# Patient Record
Sex: Female | Born: 1985 | Race: White | Hispanic: No | Marital: Married | Smoking: Former smoker
Health system: Southern US, Community
[De-identification: ages and names within clinical notes are randomized; demographics above are authoritative.]

## PROBLEM LIST (undated history)

## (undated) DIAGNOSIS — G35 Multiple sclerosis: Secondary | ICD-10-CM

## (undated) DIAGNOSIS — H539 Unspecified visual disturbance: Secondary | ICD-10-CM

## (undated) DIAGNOSIS — R569 Unspecified convulsions: Secondary | ICD-10-CM

## (undated) DIAGNOSIS — R519 Headache, unspecified: Secondary | ICD-10-CM

## (undated) DIAGNOSIS — R51 Headache: Secondary | ICD-10-CM

## (undated) DIAGNOSIS — M549 Dorsalgia, unspecified: Secondary | ICD-10-CM

## (undated) DIAGNOSIS — F445 Conversion disorder with seizures or convulsions: Secondary | ICD-10-CM

## (undated) DIAGNOSIS — G610 Guillain-Barre syndrome: Secondary | ICD-10-CM

## (undated) HISTORY — PX: LUMBAR LAMINECTOMY: SHX95

## (undated) HISTORY — DX: Headache, unspecified: R51.9

## (undated) HISTORY — PX: KNEE SURGERY: SHX244

## (undated) HISTORY — DX: Unspecified visual disturbance: H53.9

## (undated) HISTORY — PX: BACK SURGERY: SHX140

## (undated) HISTORY — DX: Headache: R51

## (undated) HISTORY — PX: VAGINAL HYSTERECTOMY: SUR661

---
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
## 2013-11-28 DIAGNOSIS — S83006A Unspecified dislocation of unspecified patella, initial encounter: Secondary | ICD-10-CM | POA: Insufficient documentation

## 2014-06-20 ENCOUNTER — Encounter: Payer: Self-pay | Admitting: Neurology

## 2014-06-20 ENCOUNTER — Ambulatory Visit (INDEPENDENT_AMBULATORY_CARE_PROVIDER_SITE_OTHER): Payer: 59 | Admitting: Neurology

## 2014-06-20 VITALS — BP 134/92 | HR 68 | Resp 12 | Ht 67.5 in | Wt 231.2 lb

## 2014-06-20 DIAGNOSIS — R200 Anesthesia of skin: Secondary | ICD-10-CM | POA: Insufficient documentation

## 2014-06-20 DIAGNOSIS — G35 Multiple sclerosis: Secondary | ICD-10-CM

## 2014-06-20 DIAGNOSIS — G0489 Other myelitis: Secondary | ICD-10-CM

## 2014-06-20 DIAGNOSIS — F4323 Adjustment disorder with mixed anxiety and depressed mood: Secondary | ICD-10-CM | POA: Insufficient documentation

## 2014-06-20 DIAGNOSIS — R350 Frequency of micturition: Secondary | ICD-10-CM | POA: Insufficient documentation

## 2014-06-20 DIAGNOSIS — G373 Acute transverse myelitis in demyelinating disease of central nervous system: Secondary | ICD-10-CM | POA: Insufficient documentation

## 2014-06-20 DIAGNOSIS — H469 Unspecified optic neuritis: Secondary | ICD-10-CM

## 2014-06-20 MED ORDER — ESCITALOPRAM OXALATE 10 MG PO TABS: 10.0000 mg | ORAL_TABLET | Freq: Every day | ORAL | Status: DC

## 2014-06-20 NOTE — Progress Notes (Signed)
[facility] PATIENT: Caitlin [name] DOB: 07-22-1985  REFERRING CLINICIAN: Fredderick Severance  HISTORY FROM: patient REASON FOR VISIT: MS   HISTORICAL  CHIEF COMPLAINT:  Chief Complaint  Patient presents with  . optic neuropathy    Sts. in 2013 she had numbness/tingling, cold sensation in left foot/leg.  Sx. progressed upwards to waist over  he next couple of days.  She saw her pcp Luiz Iron) and was sent to Dr. Ala Dach at [facility], who dx. her with GBS and treated her with IVIG.  She sts. after that her sx. slowly resolved over the next couple of mos.  She didn't have any other sx. until about 05-23-14, when she noticed decreased vision in her right eye.  She was seen by Dr. Sondra Barges at [facility]--he ordered an mri brain which   . Numbness    showed mult. lesions suspicious for MS.  She was then treated at [facility] --had 2 doses of IV SoluMedrol there, then was d/c home and had 3 SM infusions by home health.  She was told to f/u with neurology at [facility], but had difficulty getting an appt., and so asked to see RAS.  Currently, she still c/o foggy vision right eye, worse when she is outside or on a computer.  She also c/o left sided numbness, tingling./fim  . Extremity Weakness    HISTORY OF PRESENT ILLNESS:  Caitlin [name] is a 29 year old woman who had the onset of left leg numbness one day in March or April 2013, followed the next day by right leg numbness. Over the next couple days the numbness went up to her waist and she also noted decreased sensation well urinating. She was weak in both legs. She was able to walk without support for some steps but could not walk far.   She saw Dr. Ala Dach. She had several tests done MRIs of thespine were performed. MRIs of the spine were reportedly normal but I do not have the actual images for review.   The MRI of the brain showed a single small focus in the left posterior frontal lobe. There is subtle enhancement of the focus by my review but  this was not reported in the study. She also had a nerve conduction study/EMG which was normal. She had a lumbar puncture and the CSF was reportedly normal. Because of the severity of her symptoms she did undergo 5 days of IVIG treatment for suspected Guillain Barr syndrome. Within 2 months she felt she was practically back to baseline.  Imaging January, she had the onset of eye pain with movements. At that time she started to also have blurry vision which progressed over the next day. She was [facility] to [facility] about 8 days after the onset and received 2 days of IV steroids in the hospital and 3 days of IV steroids at her home.  At its worst, she could only see gray out of the eye. She is better now but she still has graying and very poor vision peripherally and reduced vision centrally. She feels it may be slightly worse now than a couple of days ago, but not as bad as it was at the onset. Vital week after she started the steroids, she also had numbness in the left side of her face and some left-sided tingling in the arm. She also noticed some leg weakness on that side. She had an MRI performed 05/31/2014. I reviewed those images and compare them with the 2013 MRI. The new MRI  shows 3 foci on the left, all in the left frontal lobe, one anterior and to posterior. There is also one right juxtacortical focus. There is a small right cerebellar hemispheric lesion too.   None of these enhanced after gadolinium. The numbness and weakness of better but she feels weaker as the day goes on and is quite a bit weaker at night than she is in the morning. She feels her gait has returned mostly to normal but a Dr. commented to her yesterday that she had a mild abnormality with the gait.  She notes extreme fatigue. She is also more sleepy. Both of these issues worsened in the afternoon. For a couple days, right after she went back to work after the steroids, she was having difficulty coming up with the right words.  Otherwise, she does not note any significant cognitive dysfunction.  She notes urinary frequency and urgency. She has not had any incontinence. She denies any hesitancy. She has not had any urinary tract infections. Denies any difficulty with swallowing or with her bowels.    Over the last month, she has noted that she is more irritable and more anxious.  REVIEW OF SYSTEMS:  Constitutional: No fevers, chills, sweats, or change in appetite Eyes: see above Ear, nose and throat: No hearing loss, ear pain, nasal congestion, sore throat Cardiovascular: No chest pain, palpitations Respiratory:  No shortness of breath at rest or with exertion.   No wheezes GastrointestinaI: No nausea, vomiting, diarrhea, abdominal pain, fecal incontinence Genitourinary:  She reports urgency and frequency.  + nocturia. Musculoskeletal:  No neck pain, back pain Integumentary: No rash, pruritus, skin lesions Neurological: as above Psychiatric: No depression at this time.  No anxiety Endocrine: No palpitations, diaphoresis, change in appetite, change in weigh or increased thirst Hematologic/Lymphatic:  No anemia, purpura, petechiae. Allergic/Immunologic: No itchy/runny eyes, nasal congestion, recent allergic reactions, rashes  ALLERGIES: Allergies  Allergen Reactions  . Ciprofloxacin Anaphylaxis    HOME MEDICATIONS: No outpatient prescriptions prior to visit.   No facility-administered medications prior to visit.    PAST MEDICAL HISTORY: Past Medical History  Diagnosis Date  . Headache   . Vision abnormalities     PAST SURGICAL HISTORY: Past Surgical History  Procedure Laterality Date  . Cholecystectomy, laparoscopic  2004  . Cesarean section  2012  . Vaginal hysterectomy      FAMILY HISTORY: Family History  Problem Relation Age of Onset  . Diabetes type II Mother   . Hypertension Mother   . Fibromyalgia Mother   . Cardiomyopathy Father   . Heart attack Father     SOCIAL  HISTORY:  History   Social History  . Marital Status: Married    Spouse Name: N/A  . Number of Children: N/A  . Years of Education: N/A   Occupational History  . Not on file.   Social History Main Topics  . Smoking status: Current Every Day Smoker -- 1.00 packs/day    Types: Cigarettes  . Smokeless tobacco: Not on file  . Alcohol Use: No  . Drug Use: No  . Sexual Activity: Not on file   Other Topics Concern  . Not on file   Social History Narrative  . No narrative on file     PHYSICAL EXAM  Filed Vitals:   06/20/14 1009  BP: 134/92  Pulse: 68  Resp: 12  Height: 5' 7.5" (1.715 m)  Weight: 231 lb 3.2 oz (104.872 kg)    Body mass index is 35.66 kg/(m^2).  General: The patient is well-developed and well-nourished and in no acute distress  Eyes:  Funduscopic exam shows normal optic discs and retinal vessels.  Neck: The neck is supple, no carotid bruits are noted.  The neck is nontender.  Respiratory: The respiratory examination is clear.  Cardiovascular: The cardiovascular examination reveals a regular rate and rhythm, no murmurs, gallops or rubs are noted.  Skin: Extremities are without significant edema.  Neurologic Exam  Mental status: The patient is alert and oriented x 3 at the time of the examination. The patient has apparent normal recent and remote memory, with an apparently normal attention span and concentration ability.   Speech is normal.  Cranial nerves: Extraocular movements are full. Pupils show 1+ right APD.    Color vision and acuity are reduced OD.  Visual fields are full.  Facial symmetry is present. There is reduced left facial sensation to soft touch and temperature.Facial strength is normal.  Trapezius and sternocleidomastoid strength is normal. No dysarthria is noted.  The tongue is midline, and the patient has symmetric elevation of the soft palate. No obvious hearing deficits are noted.  Motor:  Muscle bulk and tone are normal.  Strength is  5 / 5 in all 4 extremities.   Sensory: Sensory testing is intact to pinprick, soft touch, vibration sensation, and position sense in the arms but she reports decreased vibration sensation in the left leg.  .  Coordination: Cerebellar testing reveals good finger-nose-finger and heel-to-shin bilaterally.  Gait and station: Station and gait are normal. Tandem gait is wide looking up, better looking at feet.. Romberg is positive.   Reflexes: Deep tendon reflexes are symmetric and normal bilaterally. Plantar responses are normal.    DIAGNOSTIC DATA (LABS, IMAGING, TESTING) - I reviewed patient records, labs, notes, testing and imaging myself where available.     ASSESSMENT AND PLAN  Multiple sclerosis - Plan: Stratify JCV Antibody Test ([facility]), Pan-ANCA, Vitamin D, 25-hydroxy, CBC with Differential, CMP, MR Cervical Spine W Wo Contrast, MR Thoracic Spine W Wo Contrast  Optic neuritis - Plan: Stratify JCV Antibody Test ([facility]), Pan-ANCA, Vitamin D, 25-hydroxy, CBC with Differential, CMP, MR Cervical Spine W Wo Contrast, MR Thoracic Spine W Wo Contrast  Numbness  Transverse myelitis  Adjustment disorder with mixed anxiety and depressed mood  Urinary frequency     In summary, Caitlin [name] is a 29 year old woman had an episode in 2013 was diagnosed as Guillain-Barr syndrome but could also have been due to a transverse myelitis that did not appear on MRI images. Of note, an MRI brain at that time showed only one lesion but I believe that it showed enhancement after gadolinium Then, last month, she had the onset of right optic neuritis and her MRI of the brain showed progression with 3 or 4 to lesions, not present on the earlier MRI.  She also has had mild difficulty with gait and numbness. The combination of her symptoms and the changes on the MRI are very consistent with multiple sclerosis and we discussed that we can be fairly certain of the diagnosis. We reviewed the  different treatment options. She is concerned about both safety and efficacy and is concerned about what her future prognosis might be we went over that she has both good and bad predictors of future disability. Being a younger white female often is associated with a better prognosis. However, I believe that she has had a transverse myelitis in the past and she has evidence of one small cerebellar lesion.  Infratentorial involvement can be associated with possibility of a more aggressive course. She would prefer not to do a self injectable medication, though is not completely opposed to doing so if she needed to. We will check her JCV stratify antibody. If she is JCV antibody negative, she would prefer to go on Tysabri because of his excellent efficacy and good tolerability. However, if she is JCV-positive she would prefer to go on one of the oral scope. We discussed the options and she would prefer Gilenya I will check some blood work and we will get an EKG for baseline if she is on Gilenya and set up a first observation.-wise, we will try to get her first infusion of Tysabri quickly if she is JCV antibody negative. We discussed some of her symptoms. She has depression with anxiety, likely worsened by her current uncertainty about her future. I started Lexapro 10 mg by mouth daily and we will increase or change as needed. She also has urinary frequency but would prefer not to treat this at this time as it is more of a nuisance than a problem.  She will return to see me in 4 weeks or sooner based on the results of the studies and is advised to call us sooner if she has new or worsening neurologic symptoms.   Hendrixx Severin A. Epimenio Foot, MD, PhD 06/20/2014, 10:50 AM Certified in Neurology, Clinical Neurophysiology, Sleep Medicine, Pain Medicine and Neuroimaging  [facility] [address], [address] [place], [place] [address] [phone]

## 2014-06-21 LAB — COMPREHENSIVE METABOLIC PANEL
ALT: 23 IU/L (ref 0–32)
AST: 15 IU/L (ref 0–40)
Albumin/Globulin Ratio: 1.8 (ref 1.1–2.5)
Albumin: 4.2 g/dL (ref 3.5–5.5)
Alkaline Phosphatase: 60 IU/L (ref 39–117)
BUN/Creatinine Ratio: 22 — ABNORMAL HIGH (ref 8–20)
BUN: 11 mg/dL (ref 6–20)
Bilirubin Total: 0.3 mg/dL (ref 0.0–1.2)
CALCIUM: 9.6 mg/dL (ref 8.7–10.2)
CHLORIDE: 103 mmol/L (ref 97–108)
CO2: 23 mmol/L (ref 18–29)
Creatinine, Ser: 0.51 mg/dL — ABNORMAL LOW (ref 0.57–1.00)
GFR calc Af Amer: 149 mL/min/{1.73_m2} (ref >59)
GFR calc non Af Amer: 129 mL/min/{1.73_m2} (ref >59)
Globulin, Total: 2.4 g/dL (ref 1.5–4.5)
Glucose: 82 mg/dL (ref 65–99)
POTASSIUM: 4.2 mmol/L (ref 3.5–5.2)
Sodium: 140 mmol/L (ref 134–144)
TOTAL PROTEIN: 6.6 g/dL (ref 6.0–8.5)

## 2014-06-21 LAB — CBC WITH DIFFERENTIAL/PLATELET
BASOS ABS: 0 10*3/uL (ref 0.0–0.2)
BASOS: 0 %
EOS: 1 %
Eosinophils Absolute: 0.1 10*3/uL (ref 0.0–0.4)
HEMATOCRIT: 43.7 % (ref 34.0–46.6)
Hemoglobin: 14.9 g/dL (ref 11.1–15.9)
Immature Grans (Abs): 0 10*3/uL (ref 0.0–0.1)
Immature Granulocytes: 0 %
LYMPHS: 27 %
Lymphocytes Absolute: 2.7 10*3/uL (ref 0.7–3.1)
MCH: 31.2 pg (ref 26.6–33.0)
MCHC: 34.1 g/dL (ref 31.5–35.7)
MCV: 92 fL (ref 79–97)
MONOCYTES: 7 %
Monocytes Absolute: 0.7 10*3/uL (ref 0.1–0.9)
NEUTROS ABS: 6.6 10*3/uL (ref 1.4–7.0)
Neutrophils Relative %: 65 %
Platelets: 241 10*3/uL (ref 150–379)
RBC: 4.77 x10E6/uL (ref 3.77–5.28)
RDW: 13.7 % (ref 12.3–15.4)
WBC: 10.2 10*3/uL (ref 3.4–10.8)

## 2014-06-21 LAB — PAN-ANCA
ANCA Proteinase 3: <3.5 U/mL (ref 0.0–3.5)
C-ANCA: <1:20 {titer}

## 2014-06-21 LAB — VITAMIN D 25 HYDROXY (VIT D DEFICIENCY, FRACTURES): Vit D, 25-Hydroxy: 19.7 ng/mL — ABNORMAL LOW (ref 30.0–100.0)

## 2014-06-29 ENCOUNTER — Encounter: Payer: Self-pay | Admitting: *Deleted

## 2014-06-29 ENCOUNTER — Telehealth: Payer: Self-pay | Admitting: Neurology

## 2014-06-29 MED ORDER — VITAMIN D (ERGOCALCIFEROL) 1.25 MG (50000 UNIT) PO CAPS: 50000.0000 [IU] | ORAL_CAPSULE | ORAL | Status: DC

## 2014-06-29 NOTE — Telephone Encounter (Signed)
Discussed JCV Ab test negative (0.15)    She would like to go on Tysabri  Vit D is low and we will send in script

## 2014-07-04 ENCOUNTER — Other Ambulatory Visit: Payer: 59

## 2014-07-06 ENCOUNTER — Telehealth: Payer: Self-pay | Admitting: *Deleted

## 2014-07-06 DIAGNOSIS — Z0289 Encounter for other administrative examinations: Secondary | ICD-10-CM

## 2014-07-06 NOTE — Telephone Encounter (Signed)
Patient form on Faith desk. 

## 2014-07-10 ENCOUNTER — Encounter: Payer: Self-pay | Admitting: *Deleted

## 2014-07-10 ENCOUNTER — Telehealth: Payer: Self-pay | Admitting: Neurology

## 2014-07-10 NOTE — Telephone Encounter (Signed)
Pt is calling stating that she may have optic neuronitis in her left eye.  She had a viral cold with temp not sure if that's what brought it on.  Please call and advise.

## 2014-07-11 ENCOUNTER — Encounter: Payer: Self-pay | Admitting: Neurology

## 2014-07-11 ENCOUNTER — Ambulatory Visit: Payer: Self-pay

## 2014-07-11 ENCOUNTER — Ambulatory Visit (INDEPENDENT_AMBULATORY_CARE_PROVIDER_SITE_OTHER): Payer: 59 | Admitting: Neurology

## 2014-07-11 VITALS — BP 108/62 | HR 64 | Resp 16 | Ht 67.5 in | Wt 230.6 lb

## 2014-07-11 DIAGNOSIS — R350 Frequency of micturition: Secondary | ICD-10-CM | POA: Diagnosis not present

## 2014-07-11 DIAGNOSIS — G0489 Other myelitis: Secondary | ICD-10-CM | POA: Diagnosis not present

## 2014-07-11 DIAGNOSIS — G373 Acute transverse myelitis in demyelinating disease of central nervous system: Secondary | ICD-10-CM

## 2014-07-11 DIAGNOSIS — H469 Unspecified optic neuritis: Secondary | ICD-10-CM | POA: Diagnosis not present

## 2014-07-11 DIAGNOSIS — R269 Unspecified abnormalities of gait and mobility: Secondary | ICD-10-CM | POA: Insufficient documentation

## 2014-07-11 DIAGNOSIS — G35 Multiple sclerosis: Secondary | ICD-10-CM

## 2014-07-11 MED ORDER — METHYLPREDNISOLONE SODIUM SUCC 500 MG IJ SOLR
1000.0000 mg | INTRAMUSCULAR | Status: DC
  Administered 2014-07-11: 1000 mg via INTRAVENOUS

## 2014-07-11 NOTE — Progress Notes (Signed)
[facility] PATIENT: Caitlin [name] DOB: 06-08-1985  REFERRING CLINICIAN: Fredderick Severance  HISTORY FROM: patient REASON FOR VISIT: MS   HISTORICAL  CHIEF COMPLAINT:  Chief Complaint  Patient presents with  . Multiple Sclerosis    Sts. last Saturday and Sunday she had fever with no other sx.  On Monday she began having sharp left eye pain with looking up, down or 2 the sides.  Some photophopia.  Sts. peripheral vision is blurry./fim    HISTORY OF PRESENT ILLNESS:  Caitlin [name] is a 29 year old woman with MS.     She had the onset of left eye pain about 2 days ago. Pain is worse when she moves her eyes around.  She has not noted any visual loss and that eye. She had optic neuritis involving the right eye about 5 weeks ago and she still has not had complete recovery of vision in that eye. Vision in the left eye is still much better than vision in the right. She denies any other new symptoms such as tingling, gait change or weakness or clumsiness. She has noted some photophobia out of the left eye.  Of note, her right ON did improve some after IV Solu-Medrol.  Anti-NMO Ab in January.     She still notes a lot of fatigue.   I reviewed some additional data from [facility] during her 2013 event.    Her NCV/EMG was normal, thus a transverse myelitis is more likely than GBS  We have discussed medications for MS.   Due to her likely spinal cord event in 2013 and known inferotentorial involvement, she is at moderate risk.   We will start tysabri and get her scheduled as soon as we heasr back from insurance.     REVIEW OF SYSTEMS:  Constitutional: No fevers, chills, sweats, or change in appetite.   She has fatigue Eyes: as above Ear, nose and throat: No hearing loss, ear pain, nasal congestion, sore throat Cardiovascular: No chest pain, palpitations Respiratory:  No shortness of breath at rest or with exertion.   No wheezes GastrointestinaI: No nausea,  vomiting, diarrhea, abdominal pain, fecal incontinence Genitourinary:  She reports urgency and frequency.  She notes nocturia. Musculoskeletal:  No neck pain, back pain Integumentary: No rash, pruritus, skin lesions Neurological: as above Psychiatric: No depression at this time.  No anxiety Endocrine: No palpitations, diaphoresis, change in appetite, change in weigh or increased thirst  ALLERGIES: Allergies  Allergen Reactions  . Ciprofloxacin Anaphylaxis    HOME MEDICATIONS: Outpatient Prescriptions Prior to Visit  Medication Sig Dispense Refill  . escitalopram (LEXAPRO) 10 MG tablet Take 1 tablet (10 mg total) by mouth daily. 30 tablet 5  . natalizumab (TYSABRI) 300 MG/15ML injection Inject into the vein.    . Vitamin D, Ergocalciferol, (DRISDOL) 50000 UNITS CAPS capsule Take 1 capsule (50,000 Units total) by mouth every 7 (seven) days. 12 capsule 0   No facility-administered medications prior to visit.    PAST MEDICAL HISTORY: Past Medical History  Diagnosis Date  . Headache   . Vision abnormalities     PAST SURGICAL HISTORY: Past Surgical History  Procedure Laterality Date  . Cholecystectomy, laparoscopic  2004  . Cesarean section  2012  . Vaginal hysterectomy      FAMILY HISTORY: Family History  Problem Relation Age of Onset  . Diabetes type II Mother   . Hypertension Mother   . Fibromyalgia Mother   . Cardiomyopathy Father   . Heart attack Father  SOCIAL HISTORY:  History   Social History  . Marital Status: Married    Spouse Name: N/A  . Number of Children: N/A  . Years of Education: N/A   Occupational History  . Not on file.   Social History Main Topics  . Smoking status: Current Every Day Smoker -- 1.00 packs/day    Types: Cigarettes  . Smokeless tobacco: Not on file  . Alcohol Use: No  . Drug Use: No  . Sexual Activity: Not on file   Other Topics Concern  . Not on file   Social History Narrative     PHYSICAL EXAM  Filed  Vitals:   07/11/14 1308  BP: 108/62  Pulse: 64  Resp: 16  Height: 5' 7.5" (1.715 m)  Weight: 230 lb 9.6 oz (104.599 kg)    Body mass index is 35.56 kg/(m^2).   General: The patient is well-developed and well-nourished and in no acute distress  Eyes:  Funduscopic exam shows normal optic discs and retinal vessels.   Neurologic Exam  Mental status: The patient is alert and oriented x 3 at the time of the examination. The patient has apparent normal recent and remote memory, with an apparently normal attention span and concentration ability.   Speech is normal.  Cranial nerves: Extraocular movements are full. Pupils show 1+ right APD.    Color vision and acuity are reduced OD.  Visual fields are full.  Facial symmetry is present. There is reduced left facial sensation to soft touch and temperature.Facial strength is normal.  Trapezius and sternocleidomastoid strength is normal. No dysarthria is noted.  The tongue is midline, and the patient has symmetric elevation of the soft palate. No obvious hearing deficits are noted.  Motor:  Muscle bulk and tone are normal. Strength is  5 / 5 in all 4 extremities.   Sensory: Sensory testing is intact to  touch in the arms and legs  Coordination: Cerebellar testing reveals good finger-nose-finger  bilaterally.  Gait and station: Station and gait are normal. Tandem gait is mildly wide.. Romberg is positive.   Reflexes: Deep tendon reflexes are symmetric and normal bilaterally. Plantar responses are normal.    DIAGNOSTIC DATA (LABS, IMAGING, TESTING) - I reviewed patient records, labs, notes, testing and imaging myself where available.     ASSESSMENT AND PLAN  Optic neuritis  Multiple sclerosis  Transverse myelitis  Urinary frequency  Gait disturbance   In summary, Caitlin [name] is a 29 year old woman with the new diagnosis of MS who is having another episode of optic neuritis, this time on the left. I will treat this with 2-3  days of IV Solu-Medrol and have her take an oral taper. She still has some sequela of her prior event from 2013 with mild gait disturbance and urinary frequency.  They are not bad enough to recommend therapy at this point. Hopefully her fatigue will improve once she gets started on Tysabri as we have certainly seen that happened in the past with other patients.  She will return to see me in 4 weeks or sooner if she has new or worsening neurologic symptoms.   Yovany Clock A. Epimenio Foot, MD, PhD 07/11/2014, 1:19 PM Certified in Neurology, Clinical Neurophysiology, Sleep Medicine, Pain Medicine and Neuroimaging  [facility] [address] [place], [place] [address] [phone]

## 2014-07-11 NOTE — Addendum Note (Signed)
Addended by: Candis Schatz I on: 07/11/2014 04:31 PM   Modules accepted: Orders, Level of Service

## 2014-07-11 NOTE — Progress Notes (Signed)
1335--IV started right ac with 22g angiocath after first attempt.  Good bd. return.  NS infusing at [facility] without sx of infiltration.  SoluMedrol 1gram added to NS and is infusing over 30 min. via gravity.  1405--IV d/c with catheter intact. Pressure applied until bleeding stopped and pressure dsg. applied.   Pt. tolerated well.  Appt. given to return for 2nd SM infusion tomorrow at 1500. Pt. d/c and is ambulatory out of office without difficulty/fim

## 2014-07-11 NOTE — Telephone Encounter (Signed)
Spoke with Marchelle Folks who sts. she is doesn't think vision is worse, but she still has eye pain with movement.  Appt. given for 1pm today/fim

## 2014-07-12 ENCOUNTER — Ambulatory Visit (INDEPENDENT_AMBULATORY_CARE_PROVIDER_SITE_OTHER): Payer: 59 | Admitting: *Deleted

## 2014-07-12 DIAGNOSIS — G35 Multiple sclerosis: Secondary | ICD-10-CM

## 2014-07-12 MED ORDER — SODIUM CHLORIDE 0.9 % IV SOLN
1000.0000 mg | INTRAVENOUS | Status: DC
  Administered 2014-07-12: 1000 mg via INTRAVENOUS

## 2014-07-12 NOTE — Progress Notes (Signed)
1500--IV started left f/a with 22g angiocath after first attempt.  Good bd return.  NS infusing at [facility] without sx. of infiltration.  Solumedrol 1gram added to NS and is infusing over 30 min. via gravity.  1530--SoluMedrol complete.  IV d/c with catheter intact.  Pressure applied until bleeding stopped and pressure dsg. applied.  Dr. Epimenio Foot spoke with pt. and ordered a 3rd day of IV SoluMedrol.  Appt. given for 0830 tomorrow.  Pt. d/c, is ambulatory out of office without difficulty./fim

## 2014-07-13 ENCOUNTER — Ambulatory Visit (INDEPENDENT_AMBULATORY_CARE_PROVIDER_SITE_OTHER): Payer: 59 | Admitting: *Deleted

## 2014-07-13 ENCOUNTER — Encounter: Payer: Self-pay | Admitting: *Deleted

## 2014-07-13 VITALS — BP 128/72 | HR 70 | Resp 14

## 2014-07-13 DIAGNOSIS — G35 Multiple sclerosis: Secondary | ICD-10-CM | POA: Diagnosis not present

## 2014-07-13 DIAGNOSIS — H469 Unspecified optic neuritis: Secondary | ICD-10-CM

## 2014-07-13 MED ORDER — SODIUM CHLORIDE 0.9 % IV SOLN
1000.0000 mg | INTRAVENOUS | Status: DC
  Administered 2014-07-13: 1000 mg via INTRAVENOUS

## 2014-07-13 NOTE — Progress Notes (Signed)
0800--IV started right ac with 22g angiocath after 2nd attempt.  Good blood return.  NS infusing at [facility] without signs of infiltration.  SoluMedrol 1gram added to NS and is infusing over 30 min. via graity.  0900--SM complete.  IV d/c with cath intact.  Pressure applied until bleeding stopped and pressure dsg. applied.  Pt. d/c and is ambulatory out of office without difficulty/fim

## 2014-07-18 ENCOUNTER — Ambulatory Visit: Payer: 59 | Admitting: Neurology

## 2014-07-26 ENCOUNTER — Encounter: Payer: Self-pay | Admitting: *Deleted

## 2014-07-30 ENCOUNTER — Encounter: Payer: Self-pay | Admitting: *Deleted

## 2014-08-17 ENCOUNTER — Other Ambulatory Visit: Payer: Self-pay | Admitting: Neurology

## 2014-08-20 ENCOUNTER — Telehealth: Payer: Self-pay | Admitting: Neurology

## 2014-08-20 MED ORDER — METHYLPREDNISOLONE 4 MG PO KIT: PACK | ORAL | Status: DC

## 2014-08-20 NOTE — Telephone Encounter (Signed)
Patient is calling with extreme headache.  Her PCP has prescribed fioricet 325-50-40 mg 3 times a day but it is not helping. Please call.

## 2014-08-20 NOTE — Telephone Encounter (Signed)
Spoke with Caitlin [name]--she c/o right sided h/a, constant, waxes and wanes, onset last Monday. No relief with Fioricet rx'd by pcp.  She is alert and oriented, with speech clear and deliberate during our conversation.  She denies any injury.  Appt. offered today, but she is not able to leave work as she is already taking time off for Tysabri infusion this week.  Per RAS, ok for Medrol dose pk, and if no relief with this or if h/a worsens, he would need to see her.  Caitlin [name] is agreeable with this.  Rx. escribed to [facility] per her request/fim

## 2014-08-23 ENCOUNTER — Other Ambulatory Visit: Payer: Self-pay | Admitting: *Deleted

## 2014-08-23 MED ORDER — ELETRIPTAN HYDROBROMIDE 40 MG PO TABS: 40.0000 mg | ORAL_TABLET | ORAL | Status: DC | PRN

## 2014-08-23 NOTE — Telephone Encounter (Signed)
Pt. in for Tysabri infusion, spoke with RAS regarding h/a she had that lasted several days.  Now resolved.  Per RAS, rx. for Relpax  #10, one po at onset of h/a and may repeat once in 2 hrs prn, max of 2 tabs in 24 hr period escribed to [facility].  Savings card given.  Caitlin [name] also c/o increased fatigue.  She  has been resistant to med for fatigue, but sts. now is willing to try.  Per RAS, samples of Nuvigil , one po daily in the am given.  She will try this for a month and let me know how it works/fim

## 2014-08-24 ENCOUNTER — Encounter: Payer: Self-pay | Admitting: *Deleted

## 2014-08-24 ENCOUNTER — Telehealth: Payer: Self-pay | Admitting: Neurology

## 2014-08-24 NOTE — Telephone Encounter (Signed)
Pt is calling to state that she did have another headache but it is pretty much gone now.  Please call and advise.

## 2014-08-24 NOTE — Telephone Encounter (Signed)
Spoke with Marchelle Folks who sts. she had another h/a last night around 7pm.  H/A resolved with one Relpax.  Per RAS, no changes to treatment plan at this time.  Will discuss more at f/u visit/fim

## 2014-08-30 ENCOUNTER — Telehealth: Payer: Self-pay | Admitting: Neurology

## 2014-08-30 NOTE — Telephone Encounter (Signed)
I received a message from [facility] in the infusion suite that Caitlin [name] called with c/o rash to face.  I spoke with Caitlin [name] who sts. she had rash to face, some to chest onset about 1430 this afternoon.  She has had 2 Tysabri infusions and started Nuvigil last week.  Per RAS, I advised rash is not likely to be due to [facility], but there is a chance it could be related to Tysabri, and that she should take Benadryl  po one hr. prior to next Tysabri infusion.  If Benadryl makes her sleepy, she should plan on having a driver with her.  I have also updated Inetta Fermo in the infusion suite to be aware of possible rxn. with Tysabri, and to confirm with pt. at next appt. that she took Benadryl prior to infusion/fim

## 2014-09-20 ENCOUNTER — Other Ambulatory Visit: Payer: Self-pay | Admitting: *Deleted

## 2014-09-20 MED ORDER — ARMODAFINIL 200 MG PO TABS: 150.0000 mg | ORAL_TABLET | Freq: Every day | ORAL | Status: DC

## 2014-09-20 NOTE — Telephone Encounter (Signed)
Nuvigil rx. given at appt. for Tysabri infusion today/fim

## 2014-09-28 DIAGNOSIS — M2240 Chondromalacia patellae, unspecified knee: Secondary | ICD-10-CM | POA: Insufficient documentation

## 2014-10-01 ENCOUNTER — Telehealth: Payer: Self-pay | Admitting: *Deleted

## 2014-10-01 NOTE — Telephone Encounter (Signed)
I have spoken with Caitlin [name]--she c/o constant feeling of lightheadedness that makes her feel fatigued, since last Tysabri infusion.  She sts. she feels Nuvigil helps--doesn't think it is a true fatigue issue.  The rash she had on her neck after the first couple of Ty infusions has resolved.  Sts. she doesn't really feel dizzy.  Per RAS, I have advised he will speak with her at next Tysabri infusion.  She questioned if she might have a mild vertigo--I faxed a copy of Brandt-Daroff exercises to her at work, fax # [phone] that she can try./fim

## 2014-10-03 ENCOUNTER — Telehealth: Payer: Self-pay | Admitting: Neurology

## 2014-10-03 NOTE — Telephone Encounter (Signed)
Caitlin [name] with [facility] is calling because he needs the strength for Rx Armodafinil 200 MG TABS. Please call. Thank you.

## 2014-10-04 ENCOUNTER — Telehealth: Payer: Self-pay | Admitting: Neurology

## 2014-10-04 MED ORDER — GABAPENTIN 100 MG PO CAPS: ORAL_CAPSULE | ORAL | Status: DC

## 2014-10-04 MED ORDER — ARMODAFINIL 200 MG PO TABS: ORAL_TABLET | ORAL | Status: DC

## 2014-10-04 NOTE — Telephone Encounter (Signed)
LMTC.  Per RAS, ok for Gabapentin  po tid.  I will send this in in  tablets, so that if it causes excessive drowsiness, she can decrease dose without having to get a new rx./fim

## 2014-10-04 NOTE — Telephone Encounter (Signed)
Pt called and requested to speak with Faith RN. Regarding some burning she has been experiencing in her legs. She would like to know if she could get a Rx. For GABAPENTIN and if that would help with the burning. Please call and advise. [phone])

## 2014-10-04 NOTE — Telephone Encounter (Signed)
I have spoken with Caitlin [name] today and advised that per RAS, ok for Gabapentin.  I reviewed dosing instructions with her--she sts. she is sensitive to meds, so I advised she may start with  qhs for several nights, then increase to  for several nights, then to  qhs, then add daily doses in the same titrating fashion, to give her a better chance of tolerating them.  She verbalized understanding of same/fim

## 2014-10-18 ENCOUNTER — Other Ambulatory Visit: Payer: Self-pay | Admitting: *Deleted

## 2014-10-18 MED ORDER — AMPHETAMINE-DEXTROAMPHET ER 15 MG PO CP24: 15.0000 mg | ORAL_CAPSULE | Freq: Every day | ORAL | Status: DC

## 2014-11-06 ENCOUNTER — Telehealth: Payer: Self-pay | Admitting: *Deleted

## 2014-11-06 MED ORDER — SULFAMETHOXAZOLE-TRIMETHOPRIM 800-160 MG PO TABS: 1.0000 | ORAL_TABLET | Freq: Two times a day (BID) | ORAL | Status: DC

## 2014-11-06 NOTE — Telephone Encounter (Signed)
I have spoken with Ahlivia this morning.  She recently had knee surgery, is now c/o dark, cloudy urine and feels she is developing a uti.  She is not able to drive yet, so is not able to get to pcp for u/a.  Per RAS, ok for Septra DS bid for 7 days.  Rx. escribed to [facility] per Lashae's request/fim

## 2014-11-09 ENCOUNTER — Telehealth: Payer: Self-pay | Admitting: *Deleted

## 2014-11-09 DIAGNOSIS — R339 Retention of urine, unspecified: Secondary | ICD-10-CM | POA: Insufficient documentation

## 2014-11-09 DIAGNOSIS — G35 Multiple sclerosis: Secondary | ICD-10-CM | POA: Insufficient documentation

## 2014-11-09 DIAGNOSIS — M545 Low back pain, unspecified: Secondary | ICD-10-CM

## 2014-11-09 DIAGNOSIS — R300 Dysuria: Secondary | ICD-10-CM

## 2014-11-09 DIAGNOSIS — R39198 Other difficulties with micturition: Secondary | ICD-10-CM

## 2014-11-09 NOTE — Telephone Encounter (Signed)
I have spoken with Caitlin [name] this morning--she sts. no relief of uti sx. with Bactrim, sts. sx. are worse today, with new c/o lbp, difficulty voiding at all.  Referral made to urology for further eval of sx.  Appt. obtained for her at [facility] today at 1345.  She is aware and agreeable with this appt/fim

## 2014-11-13 ENCOUNTER — Encounter: Payer: Self-pay | Admitting: Neurology

## 2014-11-13 ENCOUNTER — Ambulatory Visit (INDEPENDENT_AMBULATORY_CARE_PROVIDER_SITE_OTHER): Payer: Commercial Managed Care - PPO | Admitting: Neurology

## 2014-11-13 VITALS — BP 130/84 | HR 88 | Resp 16 | Ht 67.5 in | Wt 230.0 lb

## 2014-11-13 DIAGNOSIS — G373 Acute transverse myelitis in demyelinating disease of central nervous system: Secondary | ICD-10-CM

## 2014-11-13 DIAGNOSIS — R269 Unspecified abnormalities of gait and mobility: Secondary | ICD-10-CM

## 2014-11-13 DIAGNOSIS — R200 Anesthesia of skin: Secondary | ICD-10-CM | POA: Diagnosis not present

## 2014-11-13 DIAGNOSIS — G0489 Other myelitis: Secondary | ICD-10-CM

## 2014-11-13 DIAGNOSIS — G35 Multiple sclerosis: Secondary | ICD-10-CM

## 2014-11-13 DIAGNOSIS — R5383 Other fatigue: Secondary | ICD-10-CM | POA: Insufficient documentation

## 2014-11-13 DIAGNOSIS — H469 Unspecified optic neuritis: Secondary | ICD-10-CM | POA: Diagnosis not present

## 2014-11-13 DIAGNOSIS — R339 Retention of urine, unspecified: Secondary | ICD-10-CM | POA: Diagnosis not present

## 2014-11-13 MED ORDER — AMPHETAMINE-DEXTROAMPHET ER 15 MG PO CP24: 15.0000 mg | ORAL_CAPSULE | Freq: Every day | ORAL | Status: DC

## 2014-11-13 NOTE — Progress Notes (Signed)
[facility] PATIENT: Caitlin [name] DOB: Apr 29, 1986  REFERRING CLINICIAN: Fredderick Severance  HISTORY FROM: patient REASON FOR VISIT: MS   HISTORICAL  CHIEF COMPLAINT:  Chief Complaint  Patient presents with  . Multiple Sclerosis    She has had 4 Tysabri infusions so far and feels she tolerates them well.  She had a minor rash with first 2 infusions but no rash with the last 2.  JCV ab last checked 06-21-14 and was neg at 0.15.  She had recent knee surgery (left), and has had more trouble voiding since then.  She saw urology on Friday and was rx'd Flomax for urinary retention.  She sts. some relief since starting Flomax/fim    HISTORY OF PRESENT ILLNESS:  Caitlin [name] is a 29 year old woman with MS.     Tysabri infusions are doing well and she tolerated it well --- just tired that day and sometimes next day.   She no longer gets a rash.   In 2012, she had an episode felt to be GBS but NCV was normal so transverse myelitis was more likely.     Gait/strength/sensation:   She had knee reconstructive surgery about 2 weeks ago and is using crutches now.   She was having balance issues -- hits walls at times.   Balance got worse since January exacerbation.    Left leg had some numbness before surgery and more now.  Strength was fine   Vision:   She had right ON in January and a milder right ON 3 months ago.  The left ON improved quickly after streroids but right eye is still off some but is better than a couplle months ago.   She never had diplopia.     Bladder:   She was having trouble emptying her bladder and had 100 cc on Bladder scan.   She is now doing better since starting Flomax.   She had a course of Bactrim for possible UTI.       Fatigue/sleep:   She still notes a lot of fatigue and has heat sensitivity.   She is sleeping worse since surgery.  Mood/Cognition:   She is doing well and denies any depression or anxiety.   Cognition has done well.   REVIEW OF  SYSTEMS:  Constitutional: No fevers, chills, sweats, or change in appetite.   She has fatigue Eyes: as above Ear, nose and throat: No hearing loss, ear pain, nasal congestion, sore throat Cardiovascular: No chest pain, palpitations Respiratory:  No shortness of breath at rest or with exertion.   No wheezes GastrointestinaI: No nausea, vomiting, diarrhea, abdominal pain, fecal incontinence Genitourinary:  She reports urgency and frequency.  She notes nocturia. Musculoskeletal:  No neck pain, back pain Integumentary: No rash, pruritus, skin lesions Neurological: as above Psychiatric: No depression at this time.  No anxiety Endocrine: No palpitations, diaphoresis, change in appetite, change in weigh or increased thirst  ALLERGIES: Allergies  Allergen Reactions  . Ciprofloxacin Anaphylaxis    HOME MEDICATIONS: Outpatient Prescriptions Prior to Visit  Medication Sig Dispense Refill  . amphetamine-dextroamphetamine (ADDERALL XR) 15 MG 24 hr capsule Take 1 capsule by mouth daily. 30 capsule 0  . Armodafinil 200 MG TABS Take one tablet daily 30 tablet 5  . eletriptan (RELPAX) 40 MG tablet Take 1 tablet (40 mg total) by mouth as needed for migraine or headache. May repeat in 2 hours if headache persists or recurs. 10 tablet 0  . escitalopram (LEXAPRO) 10 MG tablet Take  1 tablet (10 mg total) by mouth daily. 30 tablet 5  . gabapentin (NEURONTIN) 100 MG capsule Take 3 capsules by mouth 3 times daily 270 capsule 3  . methylPREDNISolone (MEDROL DOSEPAK) 4 MG tablet follow package directions 21 tablet 0  . natalizumab (TYSABRI) 300 MG/15ML injection Inject into the vein.    Marland Kitchen sulfamethoxazole-trimethoprim (BACTRIM DS,SEPTRA DS) 800-160 MG per tablet Take 1 tablet by mouth 2 (two) times daily. 14 tablet 0  . Vitamin D, Ergocalciferol, (DRISDOL) 50000 UNITS CAPS capsule Take 1 capsule (50,000 Units total) by mouth every 7 (seven) days. 12 capsule 0   Facility-Administered Medications Prior to Visit    Medication Dose Route Frequency Provider Last Rate Last Dose  . methylPREDNISolone sodium succinate (SOLU-MEDROL) 1,000 mg in sodium chloride 0.9 % 100 mL IVPB  1,000 mg Intravenous Continuous Asa Lente, MD   1,000 mg at 07/11/14 1335  . methylPREDNISolone sodium succinate (SOLU-MEDROL) 1,000 mg in sodium chloride 0.9 % 100 mL IVPB  1,000 mg Intravenous Continuous Asa Lente, MD   Stopped at 07/12/14 1530  . methylPREDNISolone sodium succinate (SOLU-MEDROL) 1,000 mg in sodium chloride 0.9 % 100 mL IVPB  1,000 mg Intravenous Continuous Asa Lente, MD   Stopped at 07/13/14 0820    PAST MEDICAL HISTORY: Past Medical History  Diagnosis Date  . Headache   . Vision abnormalities     PAST SURGICAL HISTORY: Past Surgical History  Procedure Laterality Date  . Cholecystectomy, laparoscopic  2004  . Cesarean section  2012  . Vaginal hysterectomy      FAMILY HISTORY: Family History  Problem Relation Age of Onset  . Diabetes type II Mother   . Hypertension Mother   . Fibromyalgia Mother   . Cardiomyopathy Father   . Heart attack Father     SOCIAL HISTORY:  History   Social History  . Marital Status: Married    Spouse Name: N/A  . Number of Children: N/A  . Years of Education: N/A   Occupational History  . Not on file.   Social History Main Topics  . Smoking status: Current Every Day Smoker -- 1.00 packs/day    Types: Cigarettes  . Smokeless tobacco: Not on file  . Alcohol Use: No  . Drug Use: No  . Sexual Activity: Not on file   Other Topics Concern  . Not on file   Social History Narrative     PHYSICAL EXAM  Filed Vitals:   11/13/14 0851  BP: 130/84  Pulse: 88  Resp: 16  Height: 5' 7.5" (1.715 m)  Weight: 230 lb (104.327 kg)    Body mass index is 35.47 kg/(m^2).   General: The patient is well-developed and well-nourished and in no acute distress  Eyes:  Funduscopic exam shows normal optic discs and retinal vessels.   Neurologic  Exam  Mental status: The patient is alert and oriented x 3 at the time of the examination. The patient has apparent normal recent and remote memory, with an apparently normal attention span and concentration ability.   Speech is normal.  Cranial nerves: Extraocular movements are full. Pupils show 1+ right APD.    Color vision and acuity are reduced OD.  Visual fields are full.  Facial symmetry is present. There is reduced left facial sensation to soft touch and temperature.Facial strength is normal.  Trapezius and sternocleidomastoid strength is normal. No dysarthria is noted.  The tongue is midline, and the patient has symmetric elevation of the soft palate. No  obvious hearing deficits are noted.  Motor:  Muscle bulk and tone are normal. Strength is  5 / 5 in all 4 extremities.   Sensory: Sensory testing is intact to  touch in the arms and legs  Coordination: Cerebellar testing reveals good finger-nose-finger  bilaterally.  Gait and station: Station and gait are normal. Tandem gait is mildly wide.. Romberg is positive.   Reflexes: Deep tendon reflexes are symmetric and normal bilaterally. Plantar responses are normal.    DIAGNOSTIC DATA (LABS, IMAGING, TESTING) - I reviewed patient records, labs, notes, testing and imaging myself where available.     ASSESSMENT AND PLAN  Multiple sclerosis  Optic neuritis  Transverse myelitis  Numbness  Gait disturbance  Incomplete bladder emptying    1.   Continue med's.   Refill Adderall. 2.   She will f/u with Ortho.    I am surprised she still has numbness in left leg away from the knee.   Her appt is later in week. 3.   She will return for next infusion later this week.      RTC to see me in 4 months or sooner if she has new or worsening neurologic symptoms.   Richard A. Epimenio Foot, MD, PhD 11/13/2014, 8:58 AM Certified in Neurology, Clinical Neurophysiology, Sleep Medicine, Pain Medicine and Neuroimaging  [facility] [address] [place], [place] [address] [phone]

## 2014-11-14 DIAGNOSIS — Z4889 Encounter for other specified surgical aftercare: Secondary | ICD-10-CM | POA: Insufficient documentation

## 2014-11-15 ENCOUNTER — Encounter: Payer: Self-pay | Admitting: *Deleted

## 2014-11-23 ENCOUNTER — Telehealth: Payer: Self-pay | Admitting: *Deleted

## 2014-11-23 ENCOUNTER — Encounter: Payer: Self-pay | Admitting: Neurology

## 2014-11-23 ENCOUNTER — Other Ambulatory Visit: Payer: Self-pay | Admitting: *Deleted

## 2014-11-23 ENCOUNTER — Ambulatory Visit (INDEPENDENT_AMBULATORY_CARE_PROVIDER_SITE_OTHER): Payer: Commercial Managed Care - PPO | Admitting: Neurology

## 2014-11-23 VITALS — BP 127/87 | HR 76 | Resp 16 | Ht 67.5 in | Wt 230.0 lb

## 2014-11-23 DIAGNOSIS — G0489 Other myelitis: Secondary | ICD-10-CM | POA: Diagnosis not present

## 2014-11-23 DIAGNOSIS — R200 Anesthesia of skin: Secondary | ICD-10-CM

## 2014-11-23 DIAGNOSIS — G35 Multiple sclerosis: Secondary | ICD-10-CM | POA: Diagnosis not present

## 2014-11-23 DIAGNOSIS — G373 Acute transverse myelitis in demyelinating disease of central nervous system: Secondary | ICD-10-CM

## 2014-11-23 MED ORDER — METHYLPREDNISOLONE SODIUM SUCC 1000 MG IJ SOLR: INTRAMUSCULAR | Status: DC

## 2014-11-23 NOTE — Telephone Encounter (Signed)
I have spoken with Caitlin [name] this morning--she c/o numbness entire left leg, worse inner left leg, and numbness right great toe. She recently had left knee surgery, has seen ortho who sts. numbness should not be coming from surgery.  Per RAS, since numbness is whole leg and also in great toe of nonsurgical leg, it is probably due to MS, and he would like her to have 3 days of IV Solumedrol.  Caitlin [name] is agreeable, will come in at 11am today for day 1 of infusions.  Inetta Fermo is aware/fim

## 2014-11-23 NOTE — Progress Notes (Signed)
[facility] PATIENT: Caitlin [name] DOB: [birth year]  REFERRING CLINICIAN: Fredderick Severance  HISTORY FROM: patient REASON FOR VISIT: MS   HISTORICAL  CHIEF COMPLAINT:  Chief Complaint  Patient presents with  . Multiple Sclerosis    Increased numbness--entire left leg, worse inner left leg, and right great toe.  Recent surgery on left knee.  Also feels balance is some off/fim    HISTORY OF PRESENT ILLNESS:  Caitlin [name] is a 29 year old woman with MS.  She reports a lot of more numbness in the left leg over the last 10 days. Of note, she had left knee surgery about a month ago and she had a little bit of numbness 2 weeks ago. Now, the numbness involves the entire left leg. Additionally, over the past few days the right foot has had more numbness. She feels she needs to pay more attention to her walking as she feels more clumsy.  She denies any further change in her bladder. She does not feel that the leg is weak.  Her MS is being treated with Tysabri infusions and she  has tolerated it well.  REVIEW OF SYSTEMS:  Constitutional: No fevers, chills, sweats, or change in appetite.   She has fatigue Eyes: as above Ear, nose and throat: No hearing loss, ear pain, nasal congestion, sore throat Cardiovascular: No chest pain, palpitations Respiratory:  No shortness of breath at rest or with exertion.   No wheezes GastrointestinaI: No nausea, vomiting, diarrhea, abdominal pain, fecal incontinence Genitourinary:  She reports urgency and frequency.  She notes nocturia. Musculoskeletal:  No neck pain, back pain Integumentary: No rash, pruritus, skin lesions Neurological: as above Psychiatric: No depression at this time.  No anxiety Endocrine: No palpitations, diaphoresis, change in appetite, change in weigh or increased thirst  ALLERGIES: Allergies  Allergen Reactions  . Ciprofloxacin Anaphylaxis    HOME MEDICATIONS: Outpatient Prescriptions Prior to Visit    Medication Sig Dispense Refill  . amphetamine-dextroamphetamine (ADDERALL XR) 15 MG 24 hr capsule Take 1 capsule by mouth daily. 30 capsule 0  . Armodafinil 200 MG TABS Take one tablet daily 30 tablet 5  . cyclobenzaprine (FLEXERIL) 10 MG tablet Take 10 mg by mouth.    . eletriptan (RELPAX) 40 MG tablet Take 1 tablet (40 mg total) by mouth as needed for migraine or headache. May repeat in 2 hours if headache persists or recurs. 10 tablet 0  . escitalopram (LEXAPRO) 10 MG tablet Take 1 tablet (10 mg total) by mouth daily. 30 tablet 5  . gabapentin (NEURONTIN) 100 MG capsule Take 3 capsules by mouth 3 times daily 270 capsule 3  . methylPREDNISolone (MEDROL DOSEPAK) 4 MG tablet follow package directions 21 tablet 0  . methylPREDNISolone sodium succinate (SOLU-MEDROL) 1000 MG injection Solumedrol 1 gram IV, day one at [facility], day 2 and day 3 meds to be obtained thru [facility] and administered at home by pt.  She will have existing IV and training thru MD office/fim 1 each 2  . natalizumab (TYSABRI) 300 MG/15ML injection Inject into the vein.    . tamsulosin (FLOMAX) 0.4 MG CAPS capsule Take 0.4 mg by mouth.    . Vitamin D, Ergocalciferol, (DRISDOL) 50000 UNITS CAPS capsule Take 1 capsule (50,000 Units total) by mouth every 7 (seven) days. 12 capsule 0  . sulfamethoxazole-trimethoprim (BACTRIM DS,SEPTRA DS) 800-160 MG per tablet Take 1 tablet by mouth 2 (two) times daily. (Patient not taking: Reported on 11/23/2014) 14 tablet 0   Facility-Administered Medications  Prior to Visit  Medication Dose Route Frequency Provider Last Rate Last Dose  . methylPREDNISolone sodium succinate (SOLU-MEDROL) 1,000 mg in sodium chloride 0.9 % 100 mL IVPB  1,000 mg Intravenous Continuous Asa Lente, MD   1,000 mg at 07/11/14 1335  . methylPREDNISolone sodium succinate (SOLU-MEDROL) 1,000 mg in sodium chloride 0.9 % 100 mL IVPB  1,000 mg Intravenous Continuous Asa Lente, MD   Stopped at 07/12/14 1530  .  methylPREDNISolone sodium succinate (SOLU-MEDROL) 1,000 mg in sodium chloride 0.9 % 100 mL IVPB  1,000 mg Intravenous Continuous Asa Lente, MD   Stopped at 07/13/14 0820    PAST MEDICAL HISTORY: Past Medical History  Diagnosis Date  . Headache   . Vision abnormalities     PAST SURGICAL HISTORY: Past Surgical History  Procedure Laterality Date  . Cholecystectomy, laparoscopic  2004  . Cesarean section  2012  . Vaginal hysterectomy      FAMILY HISTORY: Family History  Problem Relation Age of Onset  . Diabetes type II Mother   . Hypertension Mother   . Fibromyalgia Mother   . Cardiomyopathy Father   . Heart attack Father     SOCIAL HISTORY:  History   Social History  . Marital Status: Married    Spouse Name: N/A  . Number of Children: N/A  . Years of Education: N/A   Occupational History  . Not on file.   Social History Main Topics  . Smoking status: Current Every Day Smoker -- 1.00 packs/day    Types: Cigarettes  . Smokeless tobacco: Not on file  . Alcohol Use: No  . Drug Use: No  . Sexual Activity: Not on file   Other Topics Concern  . Not on file   Social History Narrative     PHYSICAL EXAM  Filed Vitals:   11/23/14 1212  BP: 127/87  Pulse: 76  Resp: 16  Height: 5' 7.5" (1.715 m)  Weight: 230 lb (104.327 kg)    Body mass index is 35.47 kg/(m^2).   General: The patient is well-developed and well-nourished and in no acute distress   Neurologic Exam  Mental status: The patient is alert and oriented x 3 at the time of the examination. The patient has apparent normal recent and remote memory, with an apparently normal attention span and concentration ability.   Speech is normal.  Cranial nerves: Extraocular movements are full.    Motor:  Muscle bulk and tone are normal. Strength is  5 / 5 in all 4 extremities.   Sensory: Sensory testing shows decreased sensation to touch in the entire left leg, relative to the right leg. Additionally  she reports decreased sensation to touch in the right foot compared to higher up in the leg.   Gait and station: Station and gait are normal. Tandem gait is mildly wide.. Romberg is positive.     DIAGNOSTIC DATA (LABS, IMAGING, TESTING) - I reviewed patient records, labs, notes, testing and imaging myself where available.     ASSESSMENT AND PLAN  Multiple sclerosis  Transverse myelitis  Numbness    1.   With the numbness now extending higher than the operation site and involving the right foot, her symptoms are much more likely to be due to a transverse myelitis exacerbation. Therefore, we would do 3 days of IV Solu-Medrol 1000 mg (first dose today in office), and consider additional days based on her change.    2.   She will call on Monday with an  update.  RTC for next Tysabri infusion and call sooner if there are problems.  Joyous Gleghorn A. Epimenio Foot, MD, PhD 11/23/2014, 4:35 PM Certified in Neurology, Clinical Neurophysiology, Sleep Medicine, Pain Medicine and Neuroimaging  [facility] [address] [place], [place] [address] [phone]

## 2014-11-26 ENCOUNTER — Ambulatory Visit (INDEPENDENT_AMBULATORY_CARE_PROVIDER_SITE_OTHER): Payer: Commercial Managed Care - PPO | Admitting: Neurology

## 2014-11-26 ENCOUNTER — Encounter: Payer: Self-pay | Admitting: Neurology

## 2014-11-26 VITALS — BP 140/90 | HR 74 | Resp 16 | Ht 67.5 in | Wt 232.0 lb

## 2014-11-26 DIAGNOSIS — M542 Cervicalgia: Secondary | ICD-10-CM

## 2014-11-26 DIAGNOSIS — R5383 Other fatigue: Secondary | ICD-10-CM

## 2014-11-26 DIAGNOSIS — G4489 Other headache syndrome: Secondary | ICD-10-CM

## 2014-11-26 DIAGNOSIS — R200 Anesthesia of skin: Secondary | ICD-10-CM

## 2014-11-26 DIAGNOSIS — R51 Headache: Principal | ICD-10-CM

## 2014-11-26 DIAGNOSIS — G0489 Other myelitis: Secondary | ICD-10-CM

## 2014-11-26 DIAGNOSIS — G35 Multiple sclerosis: Secondary | ICD-10-CM | POA: Diagnosis not present

## 2014-11-26 DIAGNOSIS — R519 Headache, unspecified: Secondary | ICD-10-CM | POA: Insufficient documentation

## 2014-11-26 DIAGNOSIS — G373 Acute transverse myelitis in demyelinating disease of central nervous system: Secondary | ICD-10-CM

## 2014-11-26 MED ORDER — AMPHETAMINE-DEXTROAMPHET ER 25 MG PO CP24: 25.0000 mg | ORAL_CAPSULE | Freq: Every day | ORAL | Status: DC

## 2014-11-26 MED ORDER — INDOMETHACIN 25 MG PO CAPS: 25.0000 mg | ORAL_CAPSULE | Freq: Three times a day (TID) | ORAL | Status: DC | PRN

## 2014-11-26 NOTE — Progress Notes (Signed)
[facility] PATIENT: Caitlin [name] DOB: 1985-11-07  REFERRING CLINICIAN: Fredderick Severance  HISTORY FROM: patient REASON FOR VISIT: MS   HISTORICAL  CHIEF COMPLAINT:  Chief Complaint  Patient presents with  . Multiple Sclerosis    Sts. numbness left leg is some better.  Sts. h/a is worse--has been continuous for 8-9 days--waxes and wanes.  No relief with rx. meds.Chucky May  . Fatigue    She would like to discuss increasing Adderall./fim    HISTORY OF PRESENT ILLNESS:  Caitlin [name] is a 29 year old woman with MS.  She reports a lot of more numbness in the left leg over the last 10 days.   HA:   She has a right sided headache the past 9-10 days.   She woke up with the pain and had N/V.  Pain has been intense since though N improved.   Pain goes from occiput to over forehead.   Moving her head does not change the pain. Ibuprofen has not helped.       Numbness:   She is doing a little better since 3 days of IV Solu-Medrol.   Numbness started on the side of surgery below the knee but spread to involve tthe entire leg and the right foot -- implying a spinal exacrbation  MS:   She is on Tysabri and tolerates it well.  We discussed the implications of an exacerbation and if she has one more, we would need to consider a change in therapy.      REVIEW OF SYSTEMS:  Constitutional: No fevers, chills, sweats, or change in appetite.   She has fatigue Eyes: as above Ear, nose and throat: No hearing loss, ear pain, nasal congestion, sore throat Cardiovascular: No chest pain, palpitations Respiratory:  No shortness of breath at rest or with exertion.   No wheezes GastrointestinaI: No nausea, vomiting, diarrhea, abdominal pain, fecal incontinence Genitourinary:  She reports urgency and frequency.  She notes nocturia. Musculoskeletal:  No neck pain, back pain Integumentary: No rash, pruritus, skin lesions Neurological: as above Psychiatric: No depression at this time.  No  anxiety Endocrine: No palpitations, diaphoresis, change in appetite, change in weigh or increased thirst  ALLERGIES: Allergies  Allergen Reactions  . Ciprofloxacin Anaphylaxis    HOME MEDICATIONS: Outpatient Prescriptions Prior to Visit  Medication Sig Dispense Refill  . amphetamine-dextroamphetamine (ADDERALL XR) 15 MG 24 hr capsule Take 1 capsule by mouth daily. 30 capsule 0  . eletriptan (RELPAX) 40 MG tablet Take 1 tablet (40 mg total) by mouth as needed for migraine or headache. May repeat in 2 hours if headache persists or recurs. 10 tablet 0  . escitalopram (LEXAPRO) 10 MG tablet Take 1 tablet (10 mg total) by mouth daily. 30 tablet 5  . gabapentin (NEURONTIN) 100 MG capsule Take 3 capsules by mouth 3 times daily 270 capsule 3  . natalizumab (TYSABRI) 300 MG/15ML injection Inject into the vein.    . Vitamin D, Ergocalciferol, (DRISDOL) 50000 UNITS CAPS capsule Take 1 capsule (50,000 Units total) by mouth every 7 (seven) days. 12 capsule 0  . methylPREDNISolone (MEDROL DOSEPAK) 4 MG tablet follow package directions (Patient not taking: Reported on 11/26/2014) 21 tablet 0  . methylPREDNISolone sodium succinate (SOLU-MEDROL) 1000 MG injection Solumedrol 1 gram IV, day one at [facility], day 2 and day 3 meds to be obtained thru [facility] and administered at home by pt.  She will have existing IV and training thru MD office/fim (Patient not taking: Reported on 11/26/2014)  1 each 2  . tamsulosin (FLOMAX) 0.4 MG CAPS capsule Take 0.4 mg by mouth.    . Armodafinil 200 MG TABS Take one tablet daily 30 tablet 5  . cyclobenzaprine (FLEXERIL) 10 MG tablet Take 10 mg by mouth.    . sulfamethoxazole-trimethoprim (BACTRIM DS,SEPTRA DS) 800-160 MG per tablet Take 1 tablet by mouth 2 (two) times daily. (Patient not taking: Reported on 11/23/2014) 14 tablet 0   Facility-Administered Medications Prior to Visit  Medication Dose Route Frequency Provider Last Rate Last Dose  . methylPREDNISolone sodium  succinate (SOLU-MEDROL) 1,000 mg in sodium chloride 0.9 % 100 mL IVPB  1,000 mg Intravenous Continuous Asa Lente, MD   1,000 mg at 07/11/14 1335  . methylPREDNISolone sodium succinate (SOLU-MEDROL) 1,000 mg in sodium chloride 0.9 % 100 mL IVPB  1,000 mg Intravenous Continuous Asa Lente, MD   Stopped at 07/12/14 1530  . methylPREDNISolone sodium succinate (SOLU-MEDROL) 1,000 mg in sodium chloride 0.9 % 100 mL IVPB  1,000 mg Intravenous Continuous Asa Lente, MD   Stopped at 07/13/14 0820    PAST MEDICAL HISTORY: Past Medical History  Diagnosis Date  . Headache   . Vision abnormalities     PAST SURGICAL HISTORY: Past Surgical History  Procedure Laterality Date  . Cholecystectomy, laparoscopic  2004  . Cesarean section  2012  . Vaginal hysterectomy      FAMILY HISTORY: Family History  Problem Relation Age of Onset  . Diabetes type II Mother   . Hypertension Mother   . Fibromyalgia Mother   . Cardiomyopathy Father   . Heart attack Father     SOCIAL HISTORY:  History   Social History  . Marital Status: Married    Spouse Name: N/A  . Number of Children: N/A  . Years of Education: N/A   Occupational History  . Not on file.   Social History Main Topics  . Smoking status: Current Every Day Smoker -- 1.00 packs/day    Types: Cigarettes  . Smokeless tobacco: Not on file  . Alcohol Use: No  . Drug Use: No  . Sexual Activity: Not on file   Other Topics Concern  . Not on file   Social History Narrative     PHYSICAL EXAM  Filed Vitals:   11/26/14 1039  BP: 140/90  Pulse: 74  Resp: 16  Height: 5' 7.5" (1.715 m)  Weight: 232 lb (105.235 kg)    Body mass index is 35.78 kg/(m^2).   General: The patient is well-developed and well-nourished and in no acute distress  Neck:   She has tenderness over the splenius capitis muscle on the right at the occiput.   Neurologic Exam  Mental status: The patient is alert and oriented x 3 at the time of the  examination.    Speech is normal.  Cranial nerves: Extraocular movements are full.    Motor:  Muscle bulk and tone are normal. Strength is  5 / 5 in all 4 extremities.   Sensory: Sensory testing shows decreased sensation to touch below the knee and laterally in the left leg.     Gait and station: Station and gait are normal. Tandem gait is mildly wide.. Romberg is positive.     DIAGNOSTIC DATA (LABS, IMAGING, TESTING) - I reviewed patient records, labs, notes, testing and imaging myself where available.     ASSESSMENT AND PLAN  Multiple sclerosis  Transverse myelitis  Numbness  Other headache syndrome  Neck pain  Other fatigue  1.   Right splenius capitis trigger point injection with 80 mg Depo-Medrol in [place]. He was about 50% better 10 minutes later. Since pain was not completely resolved 60 mg IM Toradol was also injected. 2.  Indomethacin 25 mg take as needed, no more than 3 in any day. Increase Adderall XR 25 mg  RTC for next Tysabri infusion and call sooner if there are problems.  Richard A. Epimenio Foot, MD, PhD 11/26/2014, 11:22 AM Certified in Neurology, Clinical Neurophysiology, Sleep Medicine, Pain Medicine and Neuroimaging  [facility] [address] [place], [place] [address] [phone]

## 2014-12-07 ENCOUNTER — Other Ambulatory Visit: Payer: Self-pay | Admitting: *Deleted

## 2014-12-07 MED ORDER — ESCITALOPRAM OXALATE 10 MG PO TABS: 10.0000 mg | ORAL_TABLET | Freq: Every day | ORAL | Status: DC

## 2014-12-07 MED ORDER — ELETRIPTAN HYDROBROMIDE 40 MG PO TABS: 40.0000 mg | ORAL_TABLET | ORAL | Status: DC | PRN

## 2014-12-12 ENCOUNTER — Ambulatory Visit: Payer: Commercial Managed Care - PPO | Admitting: Neurology

## 2014-12-13 ENCOUNTER — Ambulatory Visit (INDEPENDENT_AMBULATORY_CARE_PROVIDER_SITE_OTHER): Payer: Commercial Managed Care - PPO | Admitting: Neurology

## 2014-12-13 DIAGNOSIS — Z0289 Encounter for other administrative examinations: Secondary | ICD-10-CM

## 2014-12-13 DIAGNOSIS — G4489 Other headache syndrome: Secondary | ICD-10-CM | POA: Diagnosis not present

## 2014-12-13 DIAGNOSIS — G35 Multiple sclerosis: Secondary | ICD-10-CM

## 2014-12-13 NOTE — Progress Notes (Signed)
Caitlin [name] reports that the occipital headache has returned. It is predominantly on the right side. She has associated nausea but no vomiting. She has some photophobia. In the past, splenius capitis trigger point injections have been beneficial.  She reports her MS is stable on Tysabri and she knew MS related symptoms.  Using sterile technique the occiput was prepped with alcohol. 3 mL of Marcaine containing 80 mg Depo-Medrol was injected in a fanlike pattern into and below the splenius capitis muscle. She tolerated the procedure well.

## 2014-12-18 ENCOUNTER — Telehealth: Payer: Self-pay | Admitting: Neurology

## 2014-12-18 DIAGNOSIS — F05 Delirium due to known physiological condition: Secondary | ICD-10-CM

## 2014-12-18 DIAGNOSIS — G35D Multiple sclerosis, unspecified: Secondary | ICD-10-CM

## 2014-12-18 DIAGNOSIS — R269 Unspecified abnormalities of gait and mobility: Secondary | ICD-10-CM

## 2014-12-18 DIAGNOSIS — R51 Headache: Secondary | ICD-10-CM

## 2014-12-18 DIAGNOSIS — G35 Multiple sclerosis: Secondary | ICD-10-CM

## 2014-12-18 DIAGNOSIS — R519 Headache, unspecified: Secondary | ICD-10-CM

## 2014-12-18 NOTE — Telephone Encounter (Signed)
Patient would like for you to call her, she has a lot going on today, "headache, balance issues, feels like she's walking sideways, confused feeling"

## 2014-12-18 NOTE — Telephone Encounter (Signed)
I have spoken with Caitlin [name] this morning.  She c/o h/a that woke her from sleep at 0230 this am.  She took a Relpax and was able to lay back down.  Sts. h/a is better now but still present.  Sts. sudden onset this am of gait/balance disturbance--sts. she is leaning to the right when she walks.  Sts. she feels confused today and this is a new sx. for her as well.  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 she looked at her pt. schedule, then shortly after doesn't remember details of schedule or what she was looking for. Per RAS, please order stat mri brain and c-spine with and without contrast, to eval for presence of new ms lesions.  Caitlin [name] is agreeable with this plan and requests mri's be done at [facility].  I have also advised if sx. worsen, she should call me back.  She verbalized understanding of same/fim

## 2014-12-20 ENCOUNTER — Telehealth: Payer: Self-pay | Admitting: *Deleted

## 2014-12-20 MED ORDER — TOPIRAMATE 50 MG PO TABS: 50.0000 mg | ORAL_TABLET | Freq: Every day | ORAL | Status: DC

## 2014-12-20 NOTE — Telephone Encounter (Signed)
I have spoken with Caitlin [name] this afternoon and per RAS, advised that mri brain and c-spine do not show any new MS lesions, nothing to account for headaches, gait.  She verbalized understanding of same.  Gait is improved.  She is still c/o daily h/a and asks if there is a preventative med she can take.  Per RAS, I have offered Topamax  daily, to increase to  daily if she tolerates this well.  She verbalized understanding of same, rx. has been sent to [facility] per her request/fim

## 2014-12-27 ENCOUNTER — Other Ambulatory Visit: Payer: Self-pay | Admitting: *Deleted

## 2014-12-27 MED ORDER — AMPHETAMINE-DEXTROAMPHET ER 25 MG PO CP24: 25.0000 mg | ORAL_CAPSULE | Freq: Every day | ORAL | Status: DC

## 2014-12-27 NOTE — Telephone Encounter (Signed)
Rx. printed, signed, given directly to pt/fim 

## 2015-01-04 ENCOUNTER — Other Ambulatory Visit: Payer: Self-pay | Admitting: *Deleted

## 2015-01-04 ENCOUNTER — Encounter: Payer: Self-pay | Admitting: *Deleted

## 2015-01-04 MED ORDER — AMPHETAMINE-DEXTROAMPHET ER 25 MG PO CP24: 25.0000 mg | ORAL_CAPSULE | Freq: Every day | ORAL | Status: DC

## 2015-01-04 NOTE — Progress Notes (Signed)
Adderall rx. up front [facility]/fim 

## 2015-01-04 NOTE — Telephone Encounter (Signed)
Pt. has an infusion appt. next week, but RAS will be ooo, so rx. prepared early, postdated for due date, up front [facility]/fim

## 2015-01-09 ENCOUNTER — Encounter: Payer: Self-pay | Admitting: *Deleted

## 2015-01-29 DIAGNOSIS — M25569 Pain in unspecified knee: Secondary | ICD-10-CM | POA: Insufficient documentation

## 2015-01-29 DIAGNOSIS — S83419A Sprain of medial collateral ligament of unspecified knee, initial encounter: Secondary | ICD-10-CM | POA: Insufficient documentation

## 2015-02-07 ENCOUNTER — Other Ambulatory Visit: Payer: Self-pay | Admitting: *Deleted

## 2015-02-07 ENCOUNTER — Other Ambulatory Visit (INDEPENDENT_AMBULATORY_CARE_PROVIDER_SITE_OTHER): Payer: Self-pay

## 2015-02-07 DIAGNOSIS — Z79899 Other long term (current) drug therapy: Secondary | ICD-10-CM

## 2015-02-07 DIAGNOSIS — G35 Multiple sclerosis: Secondary | ICD-10-CM

## 2015-02-07 DIAGNOSIS — Z0289 Encounter for other administrative examinations: Secondary | ICD-10-CM

## 2015-02-07 MED ORDER — TOPIRAMATE 50 MG PO TABS: 50.0000 mg | ORAL_TABLET | Freq: Every day | ORAL | Status: DC

## 2015-02-07 NOTE — Telephone Encounter (Signed)
Caitlin [name] was in the office for a Tysabri infusion, requested r/f of Topamax.  I have escribed this to [facility]/fim

## 2015-02-08 LAB — HEPATIC FUNCTION PANEL
ALT: 14 IU/L (ref 0–32)
AST: 16 IU/L (ref 0–40)
Albumin: 3.9 g/dL (ref 3.5–5.5)
Alkaline Phosphatase: 58 IU/L (ref 39–117)
Bilirubin Total: 0.3 mg/dL (ref 0.0–1.2)
Bilirubin, Direct: 0.09 mg/dL (ref 0.00–0.40)
TOTAL PROTEIN: 6.1 g/dL (ref 6.0–8.5)

## 2015-02-12 DIAGNOSIS — R1010 Upper abdominal pain, unspecified: Secondary | ICD-10-CM | POA: Insufficient documentation

## 2015-02-12 DIAGNOSIS — R233 Spontaneous ecchymoses: Secondary | ICD-10-CM | POA: Insufficient documentation

## 2015-02-12 DIAGNOSIS — D72820 Lymphocytosis (symptomatic): Secondary | ICD-10-CM | POA: Insufficient documentation

## 2015-02-15 ENCOUNTER — Telehealth: Payer: Self-pay | Admitting: *Deleted

## 2015-02-15 MED ORDER — DIAZEPAM 5 MG PO TABS: 5.0000 mg | ORAL_TABLET | Freq: Three times a day (TID) | ORAL | Status: DC | PRN

## 2015-02-15 MED ORDER — ESCITALOPRAM OXALATE 20 MG PO TABS: 20.0000 mg | ORAL_TABLET | Freq: Every day | ORAL | Status: DC

## 2015-02-15 NOTE — Telephone Encounter (Signed)
I have spoken with Greeta this am.  She sts. she has seen Dr. Allyne Gee, hematologist--that wbc's and lymphocytes are still high, and he is r/o leukemia.  Sts. depression, anxiety are worse.  Per RAS, ok to increase Lexapro to 20mg  daily, and add Valium 5mg  po tid prn #90 with 1 additional r/f.  Lexapro escribed to  [facility], and Valium printed, signed and faxed to [facility]/fim

## 2015-03-04 ENCOUNTER — Other Ambulatory Visit: Payer: Self-pay | Admitting: *Deleted

## 2015-03-04 MED ORDER — AMPHETAMINE-DEXTROAMPHET ER 25 MG PO CP24: 25.0000 mg | ORAL_CAPSULE | Freq: Every day | ORAL | Status: DC

## 2015-03-04 NOTE — Telephone Encounter (Signed)
Pt. coming in for Tysabri infusion 03-07-15 and would like to pick up Adderall rx. at that time.  RAS will be out of the office on that day, so rx. prepared in advance and is up front [facility] for Caitlin [name] to pick up/fim

## 2015-03-18 ENCOUNTER — Ambulatory Visit: Payer: Commercial Managed Care - PPO | Admitting: Neurology

## 2015-03-29 ENCOUNTER — Encounter: Payer: Self-pay | Admitting: Neurology

## 2015-03-29 ENCOUNTER — Ambulatory Visit (INDEPENDENT_AMBULATORY_CARE_PROVIDER_SITE_OTHER): Payer: Commercial Managed Care - PPO | Admitting: Neurology

## 2015-03-29 VITALS — BP 122/80 | HR 72 | Resp 16 | Ht 67.5 in | Wt 213.6 lb

## 2015-03-29 DIAGNOSIS — R200 Anesthesia of skin: Secondary | ICD-10-CM

## 2015-03-29 DIAGNOSIS — R269 Unspecified abnormalities of gait and mobility: Secondary | ICD-10-CM | POA: Diagnosis not present

## 2015-03-29 DIAGNOSIS — G373 Acute transverse myelitis in demyelinating disease of central nervous system: Secondary | ICD-10-CM

## 2015-03-29 DIAGNOSIS — R350 Frequency of micturition: Secondary | ICD-10-CM

## 2015-03-29 DIAGNOSIS — R5383 Other fatigue: Secondary | ICD-10-CM | POA: Diagnosis not present

## 2015-03-29 DIAGNOSIS — G35 Multiple sclerosis: Secondary | ICD-10-CM | POA: Diagnosis not present

## 2015-03-29 NOTE — Progress Notes (Signed)
[facility] PATIENT: Caitlin [name] DOB: 25-Jul-1985  REFERRING CLINICIAN: Fredderick Severance  HISTORY FROM: patient REASON FOR VISIT: MS   HISTORICAL  CHIEF COMPLAINT:  Chief Complaint  Patient presents with  . Multiple Sclerosis    Sts. she continues to tolerate Tysabri well.  JCV ab last checked 02-08-15 and was neg. at 0.14.  Sts. she continues to feel fatigued despite Adderall.  Is currently experiencing gi issues--is seeing pcp for this/fim  . Headaches    Sts. h/a's are much improved with Topamax/fim    HISTORY OF PRESENT ILLNESS:  Caitlin [name] is a 29 year old woman with MS.    She is on Tysabri and tolerates it well.  She had an episode of numbness in her left leg --- uncertain if that was MS exacerbation or related to her knee surgery she had during the same time.    Numbness started on the side of surgery below the knee.    There was a nerve block for surgery.   Recovery is incomplete  HA:   Her right sided headaches resolved on topiramate 50 mg daily.    Gait/strength/numbness:  She notes balance is a little off and she fell twice.   She can't look up with eyes closed unless she holds on.   Strength is mildly reduced in left leg (feels heavy).   There is also still numbness in her left leg, about the same as one month ago.   Nothing increases/decreases numbness.    Bladder/bowel: She has had urinary hesitancy and takes tamsulosin.     She feels she empties better with it.   She topped for a while but resumed as it was helping.    Vision:   She had right optic neuritis and still notes light sensitivity in that eye.   Colors are mildly desaturated out of right eye.    Fatigue/sleep:   Fatigue is a problem for her, especially in the afternoons and she arrives home wiped out many days.   Adderall helps her fatigue during the day, though less so later in afternoon.   She sleeps well on diazepam (10 mg).   She was having a lot of thoughts going through her mind  many nights before taking valium.    Mood/cognition:     She has some depression and anxiety.    She is on Lexapro and diazepam with benefit.     REVIEW OF SYSTEMS:  Constitutional: No fevers, chills, sweats, or change in appetite.   She has fatigue Eyes: as above Ear, nose and throat: No hearing loss, ear pain, nasal congestion, sore throat Cardiovascular: No chest pain, palpitations Respiratory:  No shortness of breath at rest or with exertion.   No wheezes GastrointestinaI: No nausea, vomiting, diarrhea, abdominal pain, fecal incontinence Genitourinary:  She reports urgency and frequency.  She notes nocturia. Musculoskeletal:  No neck pain, back pain Integumentary: No rash, pruritus, skin lesions Neurological: as above Psychiatric: No depression at this time.  No anxiety Endocrine: No palpitations, diaphoresis, change in appetite, change in weigh or increased thirst  ALLERGIES: Allergies  Allergen Reactions  . Ciprofloxacin Anaphylaxis    HOME MEDICATIONS: Outpatient Prescriptions Prior to Visit  Medication Sig Dispense Refill  . amphetamine-dextroamphetamine (ADDERALL XR) 25 MG 24 hr capsule Take 1 capsule by mouth daily. 30 capsule 0  . diazepam (VALIUM) 5 MG tablet Take 1 tablet (5 mg total) by mouth 3 (three) times daily as needed for anxiety. 90 tablet 1  .  eletriptan (RELPAX) 40 MG tablet Take 1 tablet (40 mg total) by mouth as needed for migraine or headache. May repeat in 2 hours if headache persists or recurs. 10 tablet 5  . escitalopram (LEXAPRO) 20 MG tablet Take 1 tablet (20 mg total) by mouth daily. 30 tablet 5  . gabapentin (NEURONTIN) 100 MG capsule Take 3 capsules by mouth 3 times daily 270 capsule 3  . natalizumab (TYSABRI) 300 MG/15ML injection Inject into the vein.    . tamsulosin (FLOMAX) 0.4 MG CAPS capsule Take 0.4 mg by mouth.    . topiramate (TOPAMAX) 50 MG tablet Take 1 tablet (50 mg total) by mouth at bedtime. 30 tablet 3  . Vitamin D, Ergocalciferol,  (DRISDOL) 50000 UNITS CAPS capsule Take 1 capsule (50,000 Units total) by mouth every 7 (seven) days. 12 capsule 0  . indomethacin (INDOCIN) 25 MG capsule Take 1 capsule (25 mg total) by mouth 3 (three) times daily as needed. (Patient not taking: Reported on 03/29/2015) 90 capsule 1  . methylPREDNISolone (MEDROL DOSEPAK) 4 MG tablet follow package directions (Patient not taking: Reported on 11/26/2014) 21 tablet 0  . methylPREDNISolone sodium succinate (SOLU-MEDROL) 1000 MG injection Solumedrol 1 gram IV, day one at [facility], day 2 and day 3 meds to be obtained thru [facility] and administered at home by pt.  She will have existing IV and training thru MD office/fim (Patient not taking: Reported on 11/26/2014) 1 each 2   Facility-Administered Medications Prior to Visit  Medication Dose Route Frequency Provider Last Rate Last Dose  . methylPREDNISolone sodium succinate (SOLU-MEDROL) 1,000 mg in sodium chloride 0.9 % 100 mL IVPB  1,000 mg Intravenous Continuous Asa Lente, MD   1,000 mg at 07/11/14 1335  . methylPREDNISolone sodium succinate (SOLU-MEDROL) 1,000 mg in sodium chloride 0.9 % 100 mL IVPB  1,000 mg Intravenous Continuous Asa Lente, MD   Stopped at 07/12/14 1530    PAST MEDICAL HISTORY: Past Medical History  Diagnosis Date  . Headache   . Vision abnormalities     PAST SURGICAL HISTORY: Past Surgical History  Procedure Laterality Date  . Cholecystectomy, laparoscopic  2004  . Cesarean section  2012  . Vaginal hysterectomy      FAMILY HISTORY: Family History  Problem Relation Age of Onset  . Diabetes type II Mother   . Hypertension Mother   . Fibromyalgia Mother   . Cardiomyopathy Father   . Heart attack Father     SOCIAL HISTORY:  Social History   Social History  . Marital Status: Married    Spouse Name: N/A  . Number of Children: N/A  . Years of Education: N/A   Occupational History  . Not on file.   Social History Main Topics  . Smoking  status: Current Every Day Smoker -- 1.00 packs/day    Types: Cigarettes  . Smokeless tobacco: Not on file  . Alcohol Use: No  . Drug Use: No  . Sexual Activity: Not on file   Other Topics Concern  . Not on file   Social History Narrative     PHYSICAL EXAM  Filed Vitals:   03/29/15 0846  BP: 122/80  Pulse: 72  Resp: 16  Height: 5' 7.5" (1.715 m)  Weight: 213 lb 9.6 oz (96.888 kg)    Body mass index is 32.94 kg/(m^2).   General: The patient is well-developed and well-nourished and in no acute distress  Neck:   She has slight tenderness over the right splenius capitis  muscle.   Neurologic Exam  Mental status: The patient is alert and oriented x 3 at the time of the examination.  Normal memory and attention.     Cranial nerves: EOM intact.  No ptosis.  Decreased color vision right.   Right APD.      Speech is normal.    Normal facial strength and sensation.   Palatal elevation and tongue protrusion are normal.   SCM/trapezius strong.      Motor:  Muscle bulk and tone are normal. Strength is  5 / 5 in all 4 extremities.   Sensory: Sensory testing shows decreased sensation to touch and vibration below the knee.       Gait and station: Station and gait are normal. Tandem gait is mildly wide.. Romberg is mildly positive.     DIAGNOSTIC DATA (LABS, IMAGING, TESTING) - I reviewed patient records, labs, notes, testing and imaging myself where available.     ASSESSMENT AND PLAN  Multiple sclerosis (HCC)  Transverse myelitis (HCC)  Gait disturbance  Numbness  Other fatigue  Urinary frequency    1.   Continue Tysabri , next dose is next week.    2.   Continue Vit D supplements 3.   Topiramate for headaches, Adderall for fatigue, tamsulosin for bladder.  4.    RTC for next Tysabri infusion and in 6 months and call sooner if there are problems.  Breezy Hertenstein A. Epimenio Foot, MD, PhD 03/29/2015, 9:10 AM Certified in Neurology, Clinical Neurophysiology, Sleep Medicine,  Pain Medicine and Neuroimaging  [facility] [address] [place], [place] [address] [phone]

## 2015-04-02 ENCOUNTER — Other Ambulatory Visit: Payer: Self-pay | Admitting: *Deleted

## 2015-04-02 MED ORDER — DIAZEPAM 5 MG PO TABS: 5.0000 mg | ORAL_TABLET | Freq: Three times a day (TID) | ORAL | Status: DC | PRN

## 2015-04-02 MED ORDER — AMPHETAMINE-DEXTROAMPHET ER 25 MG PO CP24: 25.0000 mg | ORAL_CAPSULE | Freq: Every day | ORAL | Status: DC

## 2015-04-02 NOTE — Telephone Encounter (Signed)
Caitlin [name] is coming in for Tysabri infusion tomorrow.  Adderall and Diazepam rx's printed, signed, up front [facility] for her to pick up/fim

## 2015-04-03 ENCOUNTER — Other Ambulatory Visit: Payer: Self-pay | Admitting: Neurology

## 2015-04-03 MED ORDER — AMPHETAMINE-DEXTROAMPHET ER 25 MG PO CP24: 25.0000 mg | ORAL_CAPSULE | Freq: Every day | ORAL | Status: DC

## 2015-05-29 ENCOUNTER — Other Ambulatory Visit: Payer: Self-pay | Admitting: *Deleted

## 2015-05-29 ENCOUNTER — Ambulatory Visit (INDEPENDENT_AMBULATORY_CARE_PROVIDER_SITE_OTHER): Payer: Commercial Managed Care - PPO | Admitting: Neurology

## 2015-05-29 ENCOUNTER — Encounter: Payer: Self-pay | Admitting: Neurology

## 2015-05-29 VITALS — BP 121/74 | HR 91 | Resp 16 | Ht 67.5 in | Wt 202.0 lb

## 2015-05-29 DIAGNOSIS — R200 Anesthesia of skin: Secondary | ICD-10-CM | POA: Diagnosis not present

## 2015-05-29 DIAGNOSIS — R51 Headache: Secondary | ICD-10-CM | POA: Diagnosis not present

## 2015-05-29 DIAGNOSIS — R519 Headache, unspecified: Secondary | ICD-10-CM

## 2015-05-29 DIAGNOSIS — Z0289 Encounter for other administrative examinations: Secondary | ICD-10-CM

## 2015-05-29 DIAGNOSIS — M542 Cervicalgia: Secondary | ICD-10-CM

## 2015-05-29 MED ORDER — AMPHETAMINE-DEXTROAMPHET ER 25 MG PO CP24: 25.0000 mg | ORAL_CAPSULE | Freq: Every day | ORAL | Status: DC

## 2015-05-29 NOTE — Telephone Encounter (Signed)
Adderall rx. provided at infusion appt/fim 

## 2015-05-29 NOTE — Progress Notes (Signed)
[facility]  PATIENT: Caitlin [name] DOB: Jun 02, 1985     HISTORICAL  CHIEF COMPLAINT:  Chief Complaint  Patient presents with  . Multiple Sclerosis    Sts. onset last Thursday of temporary loss of vision, pressure feeling right side of head, right sided facial numbness.  Vision and facial numbness resolved, but she has had a constant h/a since then/fim  . Headache    HISTORY OF PRESENT ILLNESS:  About 6 days ago, she woke up with a right-sided headache that progressed as the day went on. As the pain intensified she felt that there was loss of vision on the right and she also had right facial numbness. The quality of the pain was pressure-like and was most intense in the occipital region. Pain intensified even further over the next couple of days as visual problems and the facial numbness resolved.  She continues to experience quite a bit of pain, predominantly in the occiput and radiating forward towards her for head.  The pain is at its worse when she wakes up and she takes Relpax. The pain gets a little bit better but that intensifies again. She has associated nausea but not vomiting. There is some photophobia.  An IM shot of toradol has helped her mildly.      In the past, she has had similar headaches that responded to occipital nerve blocks and/or trigger point injections.  ROS:  Out of a complete 14 system review of symptoms, the patient complains only of the following symptoms, and all other reviewed systems are negative.  Notes tingling in feet, fatigue, urinary frequency and hesitancy (unchanged).       ALLERGIES: Allergies  Allergen Reactions  . Ciprofloxacin Anaphylaxis    HOME MEDICATIONS:  Current outpatient prescriptions:  .  amphetamine-dextroamphetamine (ADDERALL XR) 25 MG 24 hr capsule, Take 1 capsule by mouth daily., Disp: 30 capsule, Rfl: 0 .  diazepam (VALIUM) 5 MG tablet, Take 1 tablet (5 mg total) by mouth 3 (three) times daily as  needed for anxiety., Disp: 90 tablet, Rfl: 1 .  eletriptan (RELPAX) 40 MG tablet, Take 1 tablet (40 mg total) by mouth as needed for migraine or headache. May repeat in 2 hours if headache persists or recurs., Disp: 10 tablet, Rfl: 5 .  escitalopram (LEXAPRO) 20 MG tablet, Take 1 tablet (20 mg total) by mouth daily., Disp: 30 tablet, Rfl: 5 .  gabapentin (NEURONTIN) 100 MG capsule, Take 3 capsules by mouth 3 times daily, Disp: 270 capsule, Rfl: 3 .  natalizumab (TYSABRI) 300 MG/15ML injection, Inject into the vein., Disp: , Rfl:  .  tamsulosin (FLOMAX) 0.4 MG CAPS capsule, Take 0.4 mg by mouth., Disp: , Rfl:  .  topiramate (TOPAMAX) 50 MG tablet, Take 1 tablet (50 mg total) by mouth at bedtime., Disp: 30 tablet, Rfl: 3 .  Vitamin D, Ergocalciferol, (DRISDOL) 50000 UNITS CAPS capsule, Take 1 capsule (50,000 Units total) by mouth every 7 (seven) days., Disp: 12 capsule, Rfl: 0 .  indomethacin (INDOCIN) 25 MG capsule, Take 1 capsule (25 mg total) by mouth 3 (three) times daily as needed. (Patient not taking: Reported on 03/29/2015), Disp: 90 capsule, Rfl: 1 .  methylPREDNISolone (MEDROL DOSEPAK) 4 MG tablet, follow package directions (Patient not taking: Reported on 11/26/2014), Disp: 21 tablet, Rfl: 0 .  methylPREDNISolone sodium succinate (SOLU-MEDROL) 1000 MG injection, Solumedrol 1 gram IV, day one at [facility], day 2 and day 3 meds to be obtained thru [facility] and administered at home  by pt.  She will have existing IV and training thru MD office/fim (Patient not taking: Reported on 11/26/2014), Disp: 1 each, Rfl: 2  Current facility-administered medications:  .  methylPREDNISolone sodium succinate (SOLU-MEDROL) 1,000 mg in sodium chloride 0.9 % 100 mL IVPB, 1,000 mg, Intravenous, Continuous, Asa Lente, MD, 1,000 mg at 07/11/14 1335 .  methylPREDNISolone sodium succinate (SOLU-MEDROL) 1,000 mg in sodium chloride 0.9 % 100 mL IVPB, 1,000 mg, Intravenous, Continuous, Asa Lente, MD,  Stopped at 07/12/14 1530  PAST MEDICAL HISTORY: Past Medical History  Diagnosis Date  . Headache   . Vision abnormalities     PAST SURGICAL HISTORY: Past Surgical History  Procedure Laterality Date  . Cholecystectomy, laparoscopic  2004  . Cesarean section  2012  . Vaginal hysterectomy      FAMILY HISTORY: Family History  Problem Relation Age of Onset  . Diabetes type II Mother   . Hypertension Mother   . Fibromyalgia Mother   . Cardiomyopathy Father   . Heart attack Father     SOCIAL HISTORY:  Social History   Social History  . Marital Status: Married    Spouse Name: N/A  . Number of Children: N/A  . Years of Education: N/A   Occupational History  . Not on file.   Social History Main Topics  . Smoking status: Current Every Day Smoker -- 1.00 packs/day    Types: Cigarettes  . Smokeless tobacco: Not on file  . Alcohol Use: No  . Drug Use: No  . Sexual Activity: Not on file   Other Topics Concern  . Not on file   Social History Narrative     PHYSICAL EXAM  Filed Vitals:   05/29/15 0919  BP: 121/74  Pulse: 91  Resp: 16  Height: 5' 7.5" (1.715 m)  Weight: 202 lb (91.627 kg)    Body mass index is 31.15 kg/(m^2).   General: The patient is well-developed and well-nourished and in no acute distress  Neck: The neck is tender at the right occiput  Neurologic Exam  Mental status: The patient is alert and oriented x 3 at the time of the examination.  Speech is normal.  Cranial nerves: Extraocular movements are full.  There is good facial sensation to soft touch bilaterally.Facial strength is normal.  Trapezius and sternocleidomastoid strength is normal. No dysarthria is noted.   Motor:  Muscle bulk is normal.   Tone is normal. Strength is  5 / 5 in all 4 extremities.   Sensory: Sensory testing is intact to touch in all 4 extremities.  Coordination: Cerebellar testing reveals good finger-nose-finger  Gait and station: Station is normal.   Gait is  normal. Tandem gait is normal.      DIAGNOSTIC DATA (LABS, IMAGING, TESTING) - I reviewed patient records, labs, notes, testing and imaging myself where available.  Lab Results  Component Value Date   WBC 10.2 06/20/2014   HGB 14.9 06/20/2014   HCT 43.7 06/20/2014   MCV 92 06/20/2014   PLT 241 06/20/2014      Component Value Date/Time   NA 140 06/20/2014 1202   K 4.2 06/20/2014 1202   CL 103 06/20/2014 1202   CO2 23 06/20/2014 1202   GLUCOSE 82 06/20/2014 1202   BUN 11 06/20/2014 1202   CREATININE 0.51* 06/20/2014 1202   CALCIUM 9.6 06/20/2014 1202   PROT 6.1 02/07/2015 1610   ALBUMIN 3.9 02/07/2015 1610   AST 16 02/07/2015 1610   ALT 14 02/07/2015 1610  ALKPHOS 58 02/07/2015 1610   BILITOT 0.3 02/07/2015 1610   GFRNONAA 129 06/20/2014 1202   GFRAA 149 06/20/2014 1202       ASSESSMENT AND PLAN  Headache disorder  Neck pain  Numbness   1.   Trigger point inject the right splenius capitis and C2C3 paraspinal muscles with 80 mg Depomedrol in Marcaine 2.   If headaches persist, consider Naratriptan bid x several days.   Return to clinic when necessary increased pain or as scheduled for other issues.   Veanna Dower A. Epimenio Foot, MD, PhD 05/29/2015, 9:39 AM Certified in Neurology, Clinical Neurophysiology, Sleep Medicine, Pain Medicine and Neuroimaging  [facility] [address], [address] [place], [place] [address] [phone]

## 2015-05-31 ENCOUNTER — Telehealth: Payer: Self-pay | Admitting: Neurology

## 2015-05-31 NOTE — Telephone Encounter (Signed)
Pt called and says she is feeling really dizzy and is wondering if it has to pertain to her infusion. She had to pull over on the side of the road because it was so bad. Pt is at work right now. She feels like she will pass out when she goes from a sitting to a standing position. She feels off balance . Feels dizzy even while sitting. Please call and advise [phone] work number.

## 2015-05-31 NOTE — Telephone Encounter (Signed)
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 I will try again later.  I called work # again and left VM  [phone] gives message that # disconnected or not in service

## 2015-06-03 ENCOUNTER — Telehealth: Payer: Self-pay | Admitting: Neurology

## 2015-06-03 NOTE — Telephone Encounter (Signed)
I spoke [place]. Her headache got much better after the shot at the last visit. About 45 days ago, she began to experience some vertigo. She does not feel that her gait is affected. Over-the-counter medications have not helped. He feels about the same today as she did 3 days ago.  She would prefer not to take and other prescription medicine. We discussed that if the dizziness worsens that she should give [place] a call back.

## 2015-06-03 NOTE — Telephone Encounter (Signed)
Patient returned Dr. Bonnita Hollow call, please call [phone].

## 2015-06-03 NOTE — Telephone Encounter (Signed)
RAS addressed this over the weekend/fim

## 2015-06-04 ENCOUNTER — Telehealth: Payer: Self-pay | Admitting: *Deleted

## 2015-06-04 NOTE — Telephone Encounter (Signed)
LMTC.  Received text message this morning that her dizziness is no better--per RAS, ok to offer Meclizine 25mg  tid prn/fim

## 2015-06-20 ENCOUNTER — Other Ambulatory Visit: Payer: Self-pay | Admitting: Neurology

## 2015-06-26 ENCOUNTER — Other Ambulatory Visit: Payer: Self-pay | Admitting: *Deleted

## 2015-06-26 MED ORDER — AMPHETAMINE-DEXTROAMPHET ER 25 MG PO CP24: 25.0000 mg | ORAL_CAPSULE | Freq: Every day | ORAL | Status: DC

## 2015-06-26 NOTE — Telephone Encounter (Signed)
Adderall rx. given at infusion appt/fim

## 2015-07-09 DIAGNOSIS — M545 Low back pain, unspecified: Secondary | ICD-10-CM | POA: Insufficient documentation

## 2015-07-12 ENCOUNTER — Encounter: Payer: Self-pay | Admitting: *Deleted

## 2015-07-22 ENCOUNTER — Other Ambulatory Visit: Payer: Self-pay | Admitting: *Deleted

## 2015-07-22 ENCOUNTER — Other Ambulatory Visit: Payer: Self-pay | Admitting: Neurology

## 2015-07-26 DIAGNOSIS — M47816 Spondylosis without myelopathy or radiculopathy, lumbar region: Secondary | ICD-10-CM | POA: Insufficient documentation

## 2015-07-26 DIAGNOSIS — M5090 Cervical disc disorder, unspecified, unspecified cervical region: Secondary | ICD-10-CM | POA: Insufficient documentation

## 2015-07-26 DIAGNOSIS — M5416 Radiculopathy, lumbar region: Secondary | ICD-10-CM | POA: Insufficient documentation

## 2015-07-26 DIAGNOSIS — M5136 Other intervertebral disc degeneration, lumbar region: Secondary | ICD-10-CM | POA: Insufficient documentation

## 2015-08-05 DIAGNOSIS — M48061 Spinal stenosis, lumbar region without neurogenic claudication: Secondary | ICD-10-CM | POA: Insufficient documentation

## 2015-08-20 ENCOUNTER — Other Ambulatory Visit: Payer: Self-pay | Admitting: *Deleted

## 2015-08-20 MED ORDER — AMPHETAMINE-DEXTROAMPHET ER 25 MG PO CP24: 25.0000 mg | ORAL_CAPSULE | Freq: Every day | ORAL | Status: DC

## 2015-08-20 NOTE — Telephone Encounter (Signed)
Adderall rx. provided at infusion appt/fim 

## 2015-08-26 ENCOUNTER — Other Ambulatory Visit: Payer: Self-pay | Admitting: Neurology

## 2015-09-10 DIAGNOSIS — Z09 Encounter for follow-up examination after completed treatment for conditions other than malignant neoplasm: Secondary | ICD-10-CM | POA: Insufficient documentation

## 2015-09-16 ENCOUNTER — Other Ambulatory Visit: Payer: Self-pay | Admitting: *Deleted

## 2015-09-16 DIAGNOSIS — Z79899 Other long term (current) drug therapy: Secondary | ICD-10-CM

## 2015-09-16 DIAGNOSIS — G35 Multiple sclerosis: Secondary | ICD-10-CM

## 2015-09-17 LAB — CBC WITH DIFFERENTIAL/PLATELET
BASOS: 0 %
Basophils Absolute: 0 10*3/uL (ref 0.0–0.2)
EOS (ABSOLUTE): 0.1 10*3/uL (ref 0.0–0.4)
EOS: 1 %
HEMATOCRIT: 41.8 % (ref 34.0–46.6)
Hemoglobin: 13.6 g/dL (ref 11.1–15.9)
Immature Grans (Abs): 0 10*3/uL (ref 0.0–0.1)
Immature Granulocytes: 0 %
LYMPHS ABS: 3.3 10*3/uL — AB (ref 0.7–3.1)
Lymphs: 31 %
MCH: 29.8 pg (ref 26.6–33.0)
MCHC: 32.5 g/dL (ref 31.5–35.7)
MCV: 92 fL (ref 79–97)
MONOCYTES: 6 %
Monocytes Absolute: 0.6 10*3/uL (ref 0.1–0.9)
NEUTROS PCT: 62 %
Neutrophils Absolute: 6.8 10*3/uL (ref 1.4–7.0)
PLATELETS: 211 10*3/uL (ref 150–379)
RBC: 4.56 x10E6/uL (ref 3.77–5.28)
RDW: 14.4 % (ref 12.3–15.4)
WBC: 10.9 10*3/uL — AB (ref 3.4–10.8)

## 2015-09-23 LAB — STRATIFY JCV AB (W/ INDEX) W/ RFLX
Index Value: 0.11
JCV ANTIBODY: NEGATIVE

## 2015-09-25 ENCOUNTER — Ambulatory Visit (INDEPENDENT_AMBULATORY_CARE_PROVIDER_SITE_OTHER): Payer: Commercial Managed Care - PPO | Admitting: Neurology

## 2015-09-25 ENCOUNTER — Encounter: Payer: Self-pay | Admitting: Neurology

## 2015-09-25 VITALS — BP 156/84 | HR 78 | Resp 16 | Ht 67.5 in | Wt 211.0 lb

## 2015-09-25 DIAGNOSIS — R269 Unspecified abnormalities of gait and mobility: Secondary | ICD-10-CM

## 2015-09-25 DIAGNOSIS — R5383 Other fatigue: Secondary | ICD-10-CM | POA: Diagnosis not present

## 2015-09-25 DIAGNOSIS — R2689 Other abnormalities of gait and mobility: Secondary | ICD-10-CM | POA: Insufficient documentation

## 2015-09-25 DIAGNOSIS — Z79899 Other long term (current) drug therapy: Secondary | ICD-10-CM

## 2015-09-25 DIAGNOSIS — M5136 Other intervertebral disc degeneration, lumbar region: Secondary | ICD-10-CM | POA: Diagnosis not present

## 2015-09-25 DIAGNOSIS — G35 Multiple sclerosis: Secondary | ICD-10-CM

## 2015-09-25 DIAGNOSIS — R200 Anesthesia of skin: Secondary | ICD-10-CM | POA: Diagnosis not present

## 2015-09-25 DIAGNOSIS — H469 Unspecified optic neuritis: Secondary | ICD-10-CM

## 2015-09-25 MED ORDER — AMPHETAMINE-DEXTROAMPHET ER 25 MG PO CP24: 25.0000 mg | ORAL_CAPSULE | Freq: Every day | ORAL | Status: DC

## 2015-09-25 MED ORDER — DIAZEPAM 10 MG PO TABS: 10.0000 mg | ORAL_TABLET | Freq: Every day | ORAL | Status: DC

## 2015-09-25 NOTE — Progress Notes (Signed)
[facility] PATIENT: Caitlin [name] DOB: 08/23/1985  REFERRING CLINICIAN: Fredderick Severance  HISTORY FROM: patient REASON FOR VISIT: MS   HISTORICAL  CHIEF COMPLAINT:  Chief Complaint  Patient presents with  . Multiple Sclerosis    Sts. she continues to tolerate Tysabri well.  JCV ab last drawn 09-16-15, but result is not back. She has had back surgery since last ov.  Today sts. left side of her face feels "off."  Not numb or tingly, just "off."/fim    HISTORY OF PRESENT ILLNESS:  Caitlin [name] is a 30 year old woman with MS.    She is on Tysabri and tolerates it well.  She had an episode of numbness in her left leg --- uncertain if that was MS exacerbation or related to her knee surgery she had during the same time.    Numbness started on the side of surgery below the knee.    There was a nerve block for surgery.   Recovery is incomplete  HA:   Her right sided headaches resolved on topiramate 50 mg daily.    Lumbar radiculopathy:   She had surgery (L4L5S1) for herniated disc and left sided radiculopathy.     Pain improved but the last few days, she has had more pain again.   She sees Dr. Petra Kuba next week.     Strength is mildly reduced in left leg (feels heavy) - could be MS or radiculpoathy.   There is also still numbness in her left leg,  Gait/strength/numbness:  She notes balance is a little off.   No recent falls.    Balance is worse with eyes closed.  She notes left face and left arm numbness x 1 day.   Nothing increases/decreases numbness.    Bladder/bowel: She has had urinary hesitancy and takes tamsulosin with benefit.     She feels she empties better with it.       Vision:   She had right optic neuritis and still notes light sensitivity in that eye. Night driving is bothersome when cars approach.     Colors are mildly desaturated out of right eye.    Fatigue/sleep:   Fatigue is better with Adderall but afternoons are sometimes still a problem.    Some  afternoons, she feels wiped out.  She sleeps well on diazepam (10 mg).   Without it she has trouble quieting her mind.  Sleep maintenance issues also better.  Mood/cognition:     She has some depression and anxiety.  Her health issues keep her down some.    She is on Lexapro and diazepam with benefit.     REVIEW OF SYSTEMS:  Constitutional: No fevers, chills, sweats, or change in appetite.   She has fatigue and insomnia Eyes: as above Ear, nose and throat: No hearing loss, ear pain, nasal congestion, sore throat Cardiovascular: No chest pain, palpitations Respiratory:  No shortness of breath at rest or with exertion.   No wheezes GastrointestinaI: No nausea, vomiting, diarrhea, abdominal pain, fecal incontinence Genitourinary:  She reports hesitancy.     She notes nocturia. Musculoskeletal:  No neck pain, some back pain Integumentary: No rash, pruritus, skin lesions Neurological: as above Psychiatric: Some depression at this time.  Some anxiety Endocrine: No palpitations, diaphoresis, change in appetite, change in weigh or increased thirst  ALLERGIES: Allergies  Allergen Reactions  . Ciprofloxacin Anaphylaxis  . Influenza Vaccines Other (See Comments)    Hx of Guillain Barre    HOME MEDICATIONS: Outpatient Prescriptions Prior  to Visit  Medication Sig Dispense Refill  . amphetamine-dextroamphetamine (ADDERALL XR) 25 MG 24 hr capsule Take 1 capsule by mouth daily. 30 capsule 0  . diazepam (VALIUM) 5 MG tablet TAKE 1 TABLET BY MOUTH 3 TIMES DAILY AS NEEDED FOR ANXIETY 90 tablet 1  . eletriptan (RELPAX) 40 MG tablet Take 1 tablet (40 mg total) by mouth as needed for migraine or headache. May repeat in 2 hours if headache persists or recurs. 10 tablet 5  . escitalopram (LEXAPRO) 20 MG tablet Take 1 tablet (20 mg total) by mouth daily. 30 tablet 5  . gabapentin (NEURONTIN) 100 MG capsule TAKE 3 CAPSULES BY MOUTH 3 TIMES A DAY 270 capsule 3  . natalizumab (TYSABRI) 300 MG/15ML injection  Inject into the vein.    . tamsulosin (FLOMAX) 0.4 MG CAPS capsule Take 0.4 mg by mouth.    . topiramate (TOPAMAX) 50 MG tablet TAKE 1 TABLET BY MOUTH EVERY NIGHT AT BEDTIME 30 tablet 11  . Vitamin D, Ergocalciferol, (DRISDOL) 50000 UNITS CAPS capsule Take 1 capsule (50,000 Units total) by mouth every 7 (seven) days. 12 capsule 0  . methylPREDNISolone sodium succinate (SOLU-MEDROL) 1000 MG injection Solumedrol 1 gram IV, day one at [facility], day 2 and day 3 meds to be obtained thru [facility] and administered at home by pt.  She will have existing IV and training thru MD office/fim (Patient not taking: Reported on 11/26/2014) 1 each 2  . indomethacin (INDOCIN) 25 MG capsule Take 1 capsule (25 mg total) by mouth 3 (three) times daily as needed. (Patient not taking: Reported on 03/29/2015) 90 capsule 1  . methylPREDNISolone (MEDROL DOSEPAK) 4 MG tablet follow package directions (Patient not taking: Reported on 11/26/2014) 21 tablet 0   Facility-Administered Medications Prior to Visit  Medication Dose Route Frequency Provider Last Rate Last Dose  . methylPREDNISolone sodium succinate (SOLU-MEDROL) 1,000 mg in sodium chloride 0.9 % 100 mL IVPB  1,000 mg Intravenous Continuous Asa Lente, MD   1,000 mg at 07/11/14 1335  . methylPREDNISolone sodium succinate (SOLU-MEDROL) 1,000 mg in sodium chloride 0.9 % 100 mL IVPB  1,000 mg Intravenous Continuous Asa Lente, MD   Stopped at 07/12/14 1530    PAST MEDICAL HISTORY: Past Medical History  Diagnosis Date  . Headache   . Vision abnormalities     PAST SURGICAL HISTORY: Past Surgical History  Procedure Laterality Date  . Cholecystectomy, laparoscopic  2004  . Cesarean section  2012  . Vaginal hysterectomy      FAMILY HISTORY: Family History  Problem Relation Age of Onset  . Diabetes type II Mother   . Hypertension Mother   . Fibromyalgia Mother   . Cardiomyopathy Father   . Heart attack Father     SOCIAL HISTORY:  Social  History   Social History  . Marital Status: Married    Spouse Name: N/A  . Number of Children: N/A  . Years of Education: N/A   Occupational History  . Not on file.   Social History Main Topics  . Smoking status: Current Every Day Smoker -- 1.00 packs/day    Types: Cigarettes  . Smokeless tobacco: Not on file  . Alcohol Use: No  . Drug Use: No  . Sexual Activity: Not on file   Other Topics Concern  . Not on file   Social History Narrative     PHYSICAL EXAM  Filed Vitals:   09/25/15 0831  BP: 156/84  Pulse: 78  Resp: 16  Height: 5' 7.5" (1.715 m)  Weight: 211 lb (95.709 kg)    Body mass index is 32.54 kg/(m^2).   General: The patient is well-developed and well-nourished and in no acute distress  Neck:   She has slight tenderness over the right splenius capitis muscle.    Slightly tender in lower back.      Neurologic Exam  Mental status: The patient is alert and oriented x 3 at the time of the examination.  Normal memory and attention.     Cranial nerves: EOM intact.  No ptosis.  Decreased color vision right.   Right APD.      Speech is normal.    Normal facial strength and slightly decreased left sensation.   Palatal elevation and tongue protrusion are normal.   SCM/trapezius strong.      Motor:  Muscle bulk and tone are normal. Strength is  5 / 5 in all 4 extremities.   Coordination:   Good RAM and FTN in arms.     Slightly reduced left Heel to shin  Sensory: Sensory testing shows decreased sensation to touch and vibration below the knee (sup peroneal) on the left.  Symmetric in arms       Gait and station: Station and gait are normal. Tandem gait is mildly wide.. Romberg is mildly positive.     DIAGNOSTIC DATA (LABS, IMAGING, TESTING) - I reviewed patient records, labs, notes, testing and imaging myself where available.     ASSESSMENT AND PLAN  Numbness  Multiple sclerosis (HCC) - Plan: Stratify JCV Ab (w/ Index) w/ Rflx, CBC with  Differential/Platelet  High risk medication use - Plan: Stratify JCV Ab (w/ Index) w/ Rflx, CBC with Differential/Platelet  Other fatigue  Gait disturbance  Degeneration of intervertebral disc of lumbar region  Optic neuritis    1.   Continue Tysabri as scheduled 300 mg IV q4weeks   2.   Continue Vit D supplements 3.   Renew Adderall for fatigue, valium for sleep/anxiety, tamsulosin for bladder.  4.    RTC for next Tysabri infusion and in 6 months and call sooner if there are problems.  Richard A. Epimenio Foot, MD, PhD 09/25/2015, 8:53 AM Certified in Neurology, Clinical Neurophysiology, Sleep Medicine, Pain Medicine and Neuroimaging  [facility] [address] [place], [place] [address] [phone]

## 2015-10-08 ENCOUNTER — Other Ambulatory Visit: Payer: Self-pay | Admitting: Neurology

## 2015-11-11 ENCOUNTER — Telehealth: Payer: Self-pay | Admitting: *Deleted

## 2015-11-11 MED ORDER — AMPHETAMINE-DEXTROAMPHET ER 30 MG PO CP24: 30.0000 mg | ORAL_CAPSULE | Freq: Every day | ORAL | Status: DC

## 2015-11-11 NOTE — Telephone Encounter (Signed)
Pt. sts. during hotter, more humid temps, she is still fatigued, despite Adderall XR  qd. Per RAS, ok to increase to Adderall XR  qd, or Adderall IR  po bid.  Pt. would like to try Adderall XR  qd. Rx. up front [facility]

## 2015-12-10 ENCOUNTER — Other Ambulatory Visit: Payer: Self-pay | Admitting: Neurology

## 2015-12-10 MED ORDER — AMPHETAMINE-DEXTROAMPHET ER 30 MG PO CP24: 30.0000 mg | ORAL_CAPSULE | Freq: Every day | ORAL | 0 refills | Status: DC

## 2016-01-07 ENCOUNTER — Telehealth: Payer: Self-pay | Admitting: *Deleted

## 2016-01-07 ENCOUNTER — Encounter: Payer: Self-pay | Admitting: *Deleted

## 2016-01-07 MED ORDER — AMPHETAMINE-DEXTROAMPHETAMINE 20 MG PO TABS: 20.0000 mg | ORAL_TABLET | Freq: Two times a day (BID) | ORAL | 0 refills | Status: DC

## 2016-01-07 NOTE — Telephone Encounter (Signed)
Pt. here for infusion appt.  Sts. Adderall XR 30mg  no longer helping fatigue.  Per RAS, ok for Adderall 20mg  po bid.  Rx. given to pt. today/fim

## 2016-01-31 ENCOUNTER — Telehealth: Payer: Self-pay | Admitting: Neurology

## 2016-01-31 NOTE — Telephone Encounter (Signed)
I received a message from Dr. Mariah Milling, neuro hospitalist at [facility] about Caitlin [name]. I called the # [phone]   3 times and was forwarded to a voicemail that was full.

## 2016-02-03 NOTE — Telephone Encounter (Signed)
Caitlin [name] was in the emergency room at [facility] Friday and was discharged.  I spoke to [name] this afternoon. She notes difficulty with swallowing and balance.  She comes in tomorrow for her Tysabri dose. When she is here we can evaluate her and she if she might benefit from a short steroid course.

## 2016-02-03 NOTE — Telephone Encounter (Signed)
Noted/fim 

## 2016-02-04 ENCOUNTER — Other Ambulatory Visit: Payer: Self-pay | Admitting: *Deleted

## 2016-02-04 ENCOUNTER — Other Ambulatory Visit (INDEPENDENT_AMBULATORY_CARE_PROVIDER_SITE_OTHER): Payer: Self-pay

## 2016-02-04 DIAGNOSIS — E559 Vitamin D deficiency, unspecified: Secondary | ICD-10-CM

## 2016-02-04 DIAGNOSIS — Z0289 Encounter for other administrative examinations: Secondary | ICD-10-CM

## 2016-02-04 DIAGNOSIS — G35 Multiple sclerosis: Secondary | ICD-10-CM

## 2016-02-04 DIAGNOSIS — Z79899 Other long term (current) drug therapy: Secondary | ICD-10-CM

## 2016-02-04 MED ORDER — TRAZODONE HCL 100 MG PO TABS: 100.0000 mg | ORAL_TABLET | Freq: Every day | ORAL | 3 refills | Status: DC

## 2016-02-04 NOTE — Telephone Encounter (Signed)
RAS saw pt. during Ty infusion this am.  JCV and Vit. D ordered, Trazodone added for difficulty going to sleep./fim

## 2016-02-07 ENCOUNTER — Telehealth: Payer: Self-pay | Admitting: *Deleted

## 2016-02-07 MED ORDER — AMPHETAMINE-DEXTROAMPHETAMINE 20 MG PO TABS: 20.0000 mg | ORAL_TABLET | Freq: Two times a day (BID) | ORAL | 0 refills | Status: DC

## 2016-02-07 NOTE — Telephone Encounter (Signed)
Rx. up front [facility] 

## 2016-02-10 LAB — VITAMIN D 1,25 DIHYDROXY
VITAMIN D 1, 25 (OH) TOTAL: 41 pg/mL
Vitamin D2 1, 25 (OH)2: <10 pg/mL
Vitamin D3 1, 25 (OH)2: 38 pg/mL

## 2016-02-14 ENCOUNTER — Encounter: Payer: Self-pay | Admitting: *Deleted

## 2016-02-17 ENCOUNTER — Telehealth: Payer: Self-pay | Admitting: Neurology

## 2016-02-17 NOTE — Telephone Encounter (Signed)
Patient reports worsening of MS that started around Sept 22nd, walking is getting worse, not sure if she needs an MRI.  An appointment was made for tomorrow October 10th with Dr. Epimenio FootSater (ok per nurse Pincus SanesMary C.)

## 2016-02-18 ENCOUNTER — Encounter: Payer: Self-pay | Admitting: Neurology

## 2016-02-18 ENCOUNTER — Ambulatory Visit (INDEPENDENT_AMBULATORY_CARE_PROVIDER_SITE_OTHER): Payer: Commercial Managed Care - PPO | Admitting: Neurology

## 2016-02-18 VITALS — BP 122/84 | HR 80 | Resp 20 | Ht 68.0 in | Wt 220.0 lb

## 2016-02-18 DIAGNOSIS — R200 Anesthesia of skin: Secondary | ICD-10-CM | POA: Diagnosis not present

## 2016-02-18 DIAGNOSIS — M5416 Radiculopathy, lumbar region: Secondary | ICD-10-CM | POA: Diagnosis not present

## 2016-02-18 DIAGNOSIS — G35 Multiple sclerosis: Secondary | ICD-10-CM | POA: Diagnosis not present

## 2016-02-18 DIAGNOSIS — R519 Headache, unspecified: Secondary | ICD-10-CM

## 2016-02-18 DIAGNOSIS — R51 Headache: Secondary | ICD-10-CM | POA: Diagnosis not present

## 2016-02-18 DIAGNOSIS — R269 Unspecified abnormalities of gait and mobility: Secondary | ICD-10-CM

## 2016-02-18 DIAGNOSIS — R5383 Other fatigue: Secondary | ICD-10-CM

## 2016-02-18 DIAGNOSIS — F4323 Adjustment disorder with mixed anxiety and depressed mood: Secondary | ICD-10-CM | POA: Diagnosis not present

## 2016-02-18 MED ORDER — DULOXETINE HCL 60 MG PO CPEP: 60.0000 mg | ORAL_CAPSULE | Freq: Every day | ORAL | 5 refills | Status: DC

## 2016-02-18 NOTE — Progress Notes (Signed)
[facility] PATIENT: Caitlin [name] DOB: 1985-07-17  REFERRING CLINICIAN: Fredderick Severance  HISTORY FROM: patient REASON FOR VISIT: MS   HISTORICAL  CHIEF COMPLAINT:  Chief Complaint  Patient presents with  . Follow-up    MS. JCV drawn 09/16/2015 was negative, tysabri, L sided weakness, brain fog    HISTORY OF PRESENT ILLNESS:  Caitlin [name] is a 30 year old woman with MS.    She is on Tysabri and tolerates it well. She has been on since March 2016.     She is reporting decreased balance and gait.   She feels her left side is worse than the right.    Symptoms started a few weeks ago and she improved some an then worsened again this weekend.    She notes weakness in the left arm and leg but not much numbness now on the left.   She also notes dribbling some when drinking last night.    She is mor etired as well.   Mood is also much worse.     During her hospital stay September, she had an MRI of the brain 01/31/2016. It was compared to the MRI dated 12/19/2014. It showed changes consistent with MS but it was felt to be stable and there were no enhancing lesions.     MRI of the cervical spine performed a 02/28/2015 showed a normal spinal cord.  Dysesthesia:  If she bends down, she gets a shooting electric shock down her spine.   She is not noting it if she just bends her neck.    HA:   Her right sided headaches resolved on topiramate 50 mg daily.    Lumbar radiculopathy:   She had surgery (L4L5S1) for herniated disc and left sided radiculopathy.  Pain improved but she felt numbness and weakness in her left lower leg still present.   Gait/strength/numbness:  She notes balance is worse and she has more of a left foot drop.   No recent falls.    Balance is worse with eyes closed.  She notes left face and left arm numbness x 1 day.   Nothing increases/decreases numbness.    Bladder/bowel: She has had urinary hesitancy and takes tamsulosin with benefit.   No recent UTI.         Vision:   She had right optic neuritis and still notes light sensitivity in that eye. Night driving is bothersome when cars approach.     Colors are mildly desaturated out of right eye.    Fatigue/sleep:   Fatigue is worse.    Adderall helps incompletely.    Some afternoons, she feels wiped out.  She sleeps well on diazepam (10 mg).   Without it she has trouble quieting her mind.  Sleep maintenance issues also better.  Mood/cognition:     She has worse depression and anxiety.  Her health issues keep her down some.    She is on Lexapro and diazepam with benefit.          REVIEW OF SYSTEMS:  Constitutional: No fevers, chills, sweats, or change in appetite.   She has fatigue and insomnia Eyes: as above Ear, nose and throat: No hearing loss, ear pain, nasal congestion, sore throat Cardiovascular: No chest pain, palpitations Respiratory:  No shortness of breath at rest or with exertion.   No wheezes GastrointestinaI: No nausea, vomiting, diarrhea, abdominal pain, fecal incontinence.   Notes mild dysphagia Genitourinary:  She reports hesitancy.     She notes nocturia. Musculoskeletal:  No neck pain, some back pain Integumentary: No rash, pruritus, skin lesions Neurological: as above Psychiatric: Depression and anxiety Endocrine: No palpitations, diaphoresis, change in appetite, change in weigh or increased thirst  ALLERGIES: Allergies  Allergen Reactions  . Ciprofloxacin Anaphylaxis  . Influenza Vaccines Other (See Comments)    Hx of Guillain Barre    HOME MEDICATIONS: Outpatient Medications Prior to Visit  Medication Sig Dispense Refill  . amphetamine-dextroamphetamine (ADDERALL) 20 MG tablet Take 1 tablet (20 mg total) by mouth 2 (two) times daily. 60 tablet 0  . diazepam (VALIUM) 10 MG tablet Take 1 tablet (10 mg total) by mouth at bedtime. 90 tablet 1  . eletriptan (RELPAX) 40 MG tablet Take 1 tablet (40 mg total) by mouth as needed for migraine or headache. May repeat in 2 hours  if headache persists or recurs. 10 tablet 5  . escitalopram (LEXAPRO) 20 MG tablet TAKE 1 TABLET BY MOUTH EVERY DAY 30 tablet 11  . gabapentin (NEURONTIN) 100 MG capsule TAKE 3 CAPSULES BY MOUTH 3 TIMES A DAY 270 capsule 3  . methylPREDNISolone sodium succinate (SOLU-MEDROL) 1000 MG injection Solumedrol 1 gram IV, day one at [facility], day 2 and day 3 meds to be obtained thru [facility] and administered at home by pt.  She will have existing IV and training thru MD office/fim 1 each 2  . natalizumab (TYSABRI) 300 MG/15ML injection Inject into the vein.    Marland Kitchen. topiramate (TOPAMAX) 50 MG tablet TAKE 1 TABLET BY MOUTH EVERY NIGHT AT BEDTIME 30 tablet 11  . traZODone (DESYREL) 100 MG tablet Take 1 tablet (100 mg total) by mouth at bedtime. 30 tablet 3  . Vitamin D, Ergocalciferol, (DRISDOL) 50000 UNITS CAPS capsule Take 1 capsule (50,000 Units total) by mouth every 7 (seven) days. 12 capsule 0   Facility-Administered Medications Prior to Visit  Medication Dose Route Frequency Provider Last Rate Last Dose  . methylPREDNISolone sodium succinate (SOLU-MEDROL) 1,000 mg in sodium chloride 0.9 % 100 mL IVPB  1,000 mg Intravenous Continuous Asa Lenteichard A Sater, MD   1,000 mg at 07/11/14 1335  . methylPREDNISolone sodium succinate (SOLU-MEDROL) 1,000 mg in sodium chloride 0.9 % 100 mL IVPB  1,000 mg Intravenous Continuous Asa Lenteichard A Sater, MD   Stopped at 07/12/14 1530    PAST MEDICAL HISTORY: Past Medical History:  Diagnosis Date  . Headache   . Vision abnormalities     PAST SURGICAL HISTORY: Past Surgical History:  Procedure Laterality Date  . CESAREAN SECTION  2012  . CHOLECYSTECTOMY, LAPAROSCOPIC  2004  . VAGINAL HYSTERECTOMY      FAMILY HISTORY: Family History  Problem Relation Age of Onset  . Diabetes type II Mother   . Hypertension Mother   . Fibromyalgia Mother   . Cardiomyopathy Father   . Heart attack Father     SOCIAL HISTORY:  Social History   Social History  . Marital  status: Married    Spouse name: N/A  . Number of children: N/A  . Years of education: N/A   Occupational History  . Not on file.   Social History Main Topics  . Smoking status: Current Every Day Smoker    Packs/day: 1.00    Types: Cigarettes  . Smokeless tobacco: Not on file  . Alcohol use No  . Drug use: No  . Sexual activity: Not on file   Other Topics Concern  . Not on file   Social History Narrative  . No narrative on file  PHYSICAL EXAM  Vitals:   02/18/16 0821  BP: 122/84  Pulse: 80  Resp: 20  Weight: 220 lb (99.8 kg)  Height: 5\' 8"  (1.727 m)    Body mass index is 33.45 kg/m.   General: The patient is well-developed and well-nourished and in no acute distress  Eyes:   Funduscopic examination showed right optic disc pallor.     Neck:   She has slight tenderness over the right splenius capitis muscle.       Neurologic Exam  Mental status: The patient is alert and oriented x 3 at the time of the examination.  Normal memory and attention.     Cranial nerves: EOM intact.  No ptosis.  Decreased color vision right.   Right APD.      Speech is normal.    Normal facial strength and slightly decreased left sensation.   Palatal elevation and tongue protrusion are normal.   SCM/trapezius strong.      Motor:  Muscle bulk and tone are normal. Strength is  5 / 5 in all 4 extremities.   Coordination:   Ok RAM and FTN in arms.     Reduced left Heel to shin  Sensory: Sensory testing shows decreased sensation to touch and vibration below the knee on the left.  Symmetric in arms   Gait and station: Station is normal.  Gait is wide with reduced stride.  Tandem gait is poor.. Romberg is mildly positive.     DIAGNOSTIC DATA (LABS, IMAGING, TESTING) - I reviewed patient records, labs, notes, testing and imaging myself where available.     ASSESSMENT AND PLAN  Multiple sclerosis (HCC) - Plan: MR Cervical Spine W Wo Contrast  Gait disturbance - Plan: MR  Cervical Spine W Wo Contrast  Numbness - Plan: MR Cervical Spine W Wo Contrast  Lumbar radiculopathy  Headache disorder  Other fatigue  Adjustment disorder with mixed anxiety and depressed mood     1.   Continue Tysabri as scheduled 300 mg IV q4weeks.   MRI of the cervical spine to assess for etiology of worsening gait and facial that there is not a prematurity or myelopathy. Further treatment based on results. 2.   Continue Vit D supplements 4000 U daily 3.   Renew Adderall for fatigue, valium for sleep/anxiety, tamsulosin for bladder.  4.    RTC for next Tysabri infusion and call sooner if there are problems.  40 minutes face-to-face evaluation with greater than one half at time counseling or coordinating care about her sclerosis and new symptoms.  Richard A. Epimenio Foot, MD, PhD 02/18/2016, 8:44 AM Certified in Neurology, Clinical Neurophysiology, Sleep Medicine, Pain Medicine and Neuroimaging  [facility] [address], [address] [place], [place] [address] [phone]

## 2016-02-20 ENCOUNTER — Telehealth: Payer: Self-pay | Admitting: Neurology

## 2016-02-20 NOTE — Telephone Encounter (Signed)
error 

## 2016-03-03 DIAGNOSIS — Z0289 Encounter for other administrative examinations: Secondary | ICD-10-CM

## 2016-03-13 ENCOUNTER — Ambulatory Visit (INDEPENDENT_AMBULATORY_CARE_PROVIDER_SITE_OTHER): Payer: Commercial Managed Care - PPO | Admitting: Neurology

## 2016-03-13 ENCOUNTER — Encounter: Payer: Self-pay | Admitting: Neurology

## 2016-03-13 VITALS — BP 124/80 | HR 72 | Resp 16 | Ht 68.0 in | Wt 226.0 lb

## 2016-03-13 DIAGNOSIS — R2689 Other abnormalities of gait and mobility: Secondary | ICD-10-CM

## 2016-03-13 DIAGNOSIS — R200 Anesthesia of skin: Secondary | ICD-10-CM

## 2016-03-13 DIAGNOSIS — R269 Unspecified abnormalities of gait and mobility: Secondary | ICD-10-CM

## 2016-03-13 DIAGNOSIS — R5383 Other fatigue: Secondary | ICD-10-CM

## 2016-03-13 DIAGNOSIS — G35 Multiple sclerosis: Secondary | ICD-10-CM | POA: Diagnosis not present

## 2016-03-13 MED ORDER — AMPHETAMINE-DEXTROAMPHETAMINE 20 MG PO TABS: 20.0000 mg | ORAL_TABLET | Freq: Two times a day (BID) | ORAL | 0 refills | Status: DC

## 2016-03-13 NOTE — Progress Notes (Signed)
[facility] PATIENT: Caitlin [name] DOB: Aug 11, 1985  REFERRING CLINICIAN: Fredderick [name]  HISTORY FROM: patient REASON FOR VISIT: MS   HISTORICAL  CHIEF COMPLAINT:  Chief Complaint  Patient presents with  . Multiple Sclerosis    Sts. she continues to tolerate Tysabri well.  JCV ab last checked 02-04-16 and was negative at 0.20.  Sts. she continues to have left leg/foot numbness/weakness.  Sts. she feels more depressed due to physical disability. She is currenlty on a leave of absence from work.  Sts. she has filed for permanent disability and needs a work note putting her out of work from 02-04-16 forward.  She also would like to discuss stem cell research.  Sts. she has found a center called [facility], in [place], [place], that   . Numbness    she is interested in/fim    HISTORY OF PRESENT ILLNESS:  Caitlin [name] is a 30 year old woman with MS.    She is on Tysabri and tolerates it well.  Her last Tysabri was 03/03/16 and she is getting it regularly on schedule.    She feels weak on her left side and she states she drags the left foot and gets worn out easily.   She went to [facility] ED 9/21 when she first noted some of these symptoms.   She reports swallowing worsened and she had more fatigue around 03/01/16 and we did an IV steroid dose.     MRI of the cervical spine 03/02/2016 was essentially normal. There was no evidence of cervical myelopathy and just disc bulge at 2 levels.   MRI of the brain 01/31/2016 was unchanged from her previous MRI showed no acute findings.  HA:   Her right sided headaches resolved on topiramate 50 mg daily.    Gait/strength/numbness:  She notes balance is off and she drags the left foot.   She stumbles and she reports falling twice.   She feels the left hand and foot are numb.   She denies face numbness.    She notes weakness in the left foot and she feels there ia also mild weakness of the left hand.      Bladder/bowel: She has had  urinary hesitancy and takes tamsulosin (as needed) with benefit.     She feels she empties better with it.       Vision:   She had right optic neuritis and still notes light sensitivity in that eye. Night driving is bothersome when cars approach. Colors are mildly desaturated out of right eye.    She notes reduced right peripheral vision.    She also notes, at times,  the right eye twitches.   Fatigue/sleep:  She reports a lot of fatigue and feels the Adderall is not helping her since mid September.     She sleeps well on diazepam (10 mg).      Sleep maintenance issues also better.  Mood/cognition:     She has depression and anxiety but feels she is crying less on Cymbalta.  .  Her health issues keep her down some.    She is on Lexapro and diazepam with benefit.     REVIEW OF SYSTEMS:  Constitutional: No fevers, chills, sweats, or change in appetite.   She has fatigue and insomnia Eyes: as above Ear, nose and throat: No hearing loss, ear pain, nasal congestion, sore throat Cardiovascular: No chest pain, palpitations Respiratory:  No shortness of breath at rest or with exertion.   No wheezes GastrointestinaI:  No nausea, vomiting, diarrhea, abdominal pain, fecal incontinence Genitourinary:  She reports hesitancy.     She notes nocturia. Musculoskeletal:  No neck pain, some back pain Integumentary: No rash, pruritus, skin lesions Neurological: as above Psychiatric: Some depression at this time.  Some anxiety Endocrine: No palpitations, diaphoresis, change in appetite, change in weigh or increased thirst  ALLERGIES: Allergies  Allergen Reactions  . Ciprofloxacin Anaphylaxis  . Influenza Vaccines Other (See Comments)    Hx of Guillain Barre    HOME MEDICATIONS: Outpatient Medications Prior to Visit  Medication Sig Dispense Refill  . amphetamine-dextroamphetamine (ADDERALL) 20 MG tablet Take 1 tablet (20 mg total) by mouth 2 (two) times daily. 60 tablet 0  . diazepam (VALIUM) 10 MG tablet  Take 1 tablet (10 mg total) by mouth at bedtime. 90 tablet 1  . DULoxetine (CYMBALTA) 60 MG capsule Take 1 capsule (60 mg total) by mouth daily. 30 capsule 5  . eletriptan (RELPAX) 40 MG tablet Take 1 tablet (40 mg total) by mouth as needed for migraine or headache. May repeat in 2 hours if headache persists or recurs. 10 tablet 5  . gabapentin (NEURONTIN) 100 MG capsule TAKE 3 CAPSULES BY MOUTH 3 TIMES A DAY 270 capsule 3  . methylPREDNISolone sodium succinate (SOLU-MEDROL) 1000 MG injection Solumedrol 1 gram IV, day one at [facility], day 2 and day 3 meds to be obtained thru [facility] and administered at home by pt.  She will have existing IV and training thru MD office/fim 1 each 2  . natalizumab (TYSABRI) 300 MG/15ML injection Inject into the vein.    Marland Kitchen. topiramate (TOPAMAX) 50 MG tablet TAKE 1 TABLET BY MOUTH EVERY NIGHT AT BEDTIME 30 tablet 11  . traZODone (DESYREL) 100 MG tablet Take 1 tablet (100 mg total) by mouth at bedtime. 30 tablet 3   Facility-Administered Medications Prior to Visit  Medication Dose Route Frequency Provider Last Rate Last Dose  . methylPREDNISolone sodium succinate (SOLU-MEDROL) 1,000 mg in sodium chloride 0.9 % 100 mL IVPB  1,000 mg Intravenous Continuous Caitlin Lenteichard A Lynda Capistran, MD   1,000 mg at 07/11/14 1335  . methylPREDNISolone sodium succinate (SOLU-MEDROL) 1,000 mg in sodium chloride 0.9 % 100 mL IVPB  1,000 mg Intravenous Continuous Caitlin Lenteichard A Anh Bigos, MD   Stopped at 07/12/14 1530    PAST MEDICAL HISTORY: Past Medical History:  Diagnosis Date  . Headache   . Vision abnormalities     PAST SURGICAL HISTORY: Past Surgical History:  Procedure Laterality Date  . CESAREAN SECTION  2012  . CHOLECYSTECTOMY, LAPAROSCOPIC  2004  . VAGINAL HYSTERECTOMY      FAMILY HISTORY: Family History  Problem Relation Age of Onset  . Diabetes type II Mother   . Hypertension Mother   . Fibromyalgia Mother   . Cardiomyopathy Father   . Heart attack Father     SOCIAL  HISTORY:  Social History   Social History  . Marital status: Married    Spouse name: N/A  . Number of children: N/A  . Years of education: N/A   Occupational History  . Not on file.   Social History Main Topics  . Smoking status: Current Every Day Smoker    Packs/day: 1.00    Types: Cigarettes  . Smokeless tobacco: Not on file  . Alcohol use No  . Drug use: No  . Sexual activity: Not on file   Other Topics Concern  . Not on file   Social History Narrative  . No narrative  on file     PHYSICAL EXAM  Vitals:   03/13/16 0837  BP: 124/80  Pulse: 72  Resp: 16  Weight: 226 lb (102.5 kg)  Height: 5\' 8"  (1.727 m)    Body mass index is 34.36 kg/m.   General: The patient is well-developed and well-nourished and in no acute distress  Neurologic Exam  Mental status: The patient is alert and oriented x 3 at the time of the examination.  Normal memory and attention.     Cranial nerves: EOM intact.  No ptosis.  Decreased color vision right.   Right APD.      Speech is normal.    Normal facial strength and slightly decreased left sensation.   Palatal elevation and tongue protrusion are normal.   SCM/trapezius are strong.   Hearing is symmetric         Motor:  Muscle bulk and tone are normal. Strength is  5 / 5 in all 4 extremities.   Coordination:   Good RAM and FTN in arms.     Slightly reduced left Heel to shin  Sensory: Sensory testing shows decreased sensation to touch and vibration below the knee on the left.  Symmetric in arms       Gait and station: Station is normal.    Gait is wide and off balanced. Tandem gait is poor.. Romberg is positive.     DIAGNOSTIC DATA (LABS, IMAGING, TESTING) - I reviewed patient records, labs, notes, testing and imaging myself where available.     ASSESSMENT AND PLAN  No diagnosis found.   1.   Continue Tysabri as scheduled 300 mg IV q4weeks   2.   If left ankle strength does not improve over the next few months, consider  an AFO.  3.   Renew Adderall for fatigue, valium for sleep/anxiety, tamsulosin for bladder.  4.   Note for work to be out x 2 more months.     5.   We had a very long conversation about the current state of stem cells and the treatment of MS. She was potentially interested in having a mesenchymal stem cell procedure at [facility] in [place]. It is my opinion that mesenchymal stem cell "transplants" have not reached a point where they are useful in MS and I do not think that any of the Liposuction procedures where "stem cells are reinject ed within a day or so have any value. There continues to be a lot of research. Hematopoietic stem cell transsplants may have value in MS though there is risk and they may not be covered by insurance as it is off label.  RTC for next Tysabri infusion and in 3 months and call sooner if there are problems.  48 minutes face-to-face evaluation with greater than one of the time counseling and coordinating care about her current impairments treatments and stem cells.  Kerith Sherley A. Epimenio Foot, MD, PhD 03/13/2016, 8:44 AM Certified in Neurology, Clinical Neurophysiology, Sleep Medicine, Pain Medicine and Neuroimaging  [facility] [address], [address] [place], [place] [address] [phone]

## 2016-03-31 ENCOUNTER — Other Ambulatory Visit: Payer: Self-pay | Admitting: Neurology

## 2016-03-31 MED ORDER — AMPHETAMINE-DEXTROAMPHETAMINE 20 MG PO TABS: 20.0000 mg | ORAL_TABLET | Freq: Two times a day (BID) | ORAL | 0 refills | Status: DC

## 2016-04-17 ENCOUNTER — Other Ambulatory Visit: Payer: Self-pay | Admitting: Neurology

## 2016-05-09 ENCOUNTER — Other Ambulatory Visit: Payer: Self-pay | Admitting: Neurology

## 2016-05-26 ENCOUNTER — Telehealth: Payer: Self-pay | Admitting: Neurology

## 2016-05-26 MED ORDER — AMPHETAMINE-DEXTROAMPHETAMINE 20 MG PO TABS: 20.0000 mg | ORAL_TABLET | Freq: Two times a day (BID) | ORAL | 0 refills | Status: DC

## 2016-05-26 NOTE — Telephone Encounter (Signed)
Adderall rx. given directly to pt. in lobby/fim

## 2016-05-26 NOTE — Telephone Encounter (Signed)
Patient requesting refill of Adderall. She is in the lobby. She came for infusion but Inetta Fermo called in sick today and did not reach her. Best call back [phone]

## 2016-06-02 ENCOUNTER — Other Ambulatory Visit: Payer: Self-pay | Admitting: Neurology

## 2016-06-08 ENCOUNTER — Other Ambulatory Visit: Payer: Self-pay | Admitting: Neurology

## 2016-06-15 ENCOUNTER — Encounter: Payer: Self-pay | Admitting: Neurology

## 2016-06-15 ENCOUNTER — Ambulatory Visit (INDEPENDENT_AMBULATORY_CARE_PROVIDER_SITE_OTHER): Payer: BLUE CROSS/BLUE SHIELD | Admitting: Neurology

## 2016-06-15 VITALS — BP 121/78 | HR 82 | Resp 18 | Ht 68.0 in | Wt 246.0 lb

## 2016-06-15 DIAGNOSIS — G35 Multiple sclerosis: Secondary | ICD-10-CM | POA: Diagnosis not present

## 2016-06-15 DIAGNOSIS — H469 Unspecified optic neuritis: Secondary | ICD-10-CM | POA: Diagnosis not present

## 2016-06-15 DIAGNOSIS — R200 Anesthesia of skin: Secondary | ICD-10-CM | POA: Diagnosis not present

## 2016-06-15 DIAGNOSIS — R269 Unspecified abnormalities of gait and mobility: Secondary | ICD-10-CM

## 2016-06-15 DIAGNOSIS — F445 Conversion disorder with seizures or convulsions: Secondary | ICD-10-CM | POA: Insufficient documentation

## 2016-06-15 DIAGNOSIS — F4323 Adjustment disorder with mixed anxiety and depressed mood: Secondary | ICD-10-CM

## 2016-06-15 MED ORDER — LAMOTRIGINE 100 MG PO TABS: 100.0000 mg | ORAL_TABLET | Freq: Two times a day (BID) | ORAL | 5 refills | Status: DC

## 2016-06-15 MED ORDER — CLONAZEPAM 1 MG PO TABS: 1.0000 mg | ORAL_TABLET | Freq: Two times a day (BID) | ORAL | 2 refills | Status: DC

## 2016-06-15 MED ORDER — AMPHETAMINE-DEXTROAMPHETAMINE 20 MG PO TABS: 20.0000 mg | ORAL_TABLET | Freq: Two times a day (BID) | ORAL | 0 refills | Status: DC

## 2016-06-15 NOTE — Progress Notes (Signed)
[facility] PATIENT: Caitlin [name] DOB: 08/25/1985  REFERRING CLINICIAN: Fredderick Severance  HISTORY FROM: patient REASON FOR VISIT: MS   HISTORICAL  CHIEF COMPLAINT:  Chief Complaint  Patient presents with  . Multiple Sclerosis    Sts. she continues to tolerate Tysabri well.  JCV ab last checked 02-04-16 and was negative at 0.20.  She is ambulatory with a rolling walker, dragging her left foot.  Was recently hospitalized after multiple episodes of altered awareness, and was dx. with pseudoseizures.  Sts. she would like to discuss med. for anxiety--doesn't think Valium is helping./fim  . 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      Recent hospitalisation:   She reports having 6 convulsions to home and 2 in the hospital .   EEG was c/w pseudoseizures.   The 6 spells at her home lasted 20 minutes and were associated with altered consciousness.   At the hospital, she was placed on Dilantin in there hospital but not discharged on any AED.   She has had a few more episodes since she's been home.  We discussed the pseudoseizures are often a stress reaction.   She denies a history of physical or psychological abuse.  Mood:  She has anxiety and depression.   She is on Cymbalta and diazepam (10 mg at bedtime) without much benefi.   She was seen once by psychiatry in the hospital.   No follow-up with psych was made.     MS:   MS was diagnosed after an episode of optic neuritis and having a couple small non-specific foci on brain MRI.    A cpulel years earlier she had a possible transverse myelitis (vs mild GBS).   She remains on Tysabri every 4 weeks. She tolerates it well. She is on Tysabri and tolerates it well and is very compliant.     She feels weak on her left side and she states she drags the left foot and gets worn out easily.  When her leg worsened, she went to the ED.,   MRI of the  cervical spine 03/02/2016 was essentially normal. There was no evidence of cervical myelopathy and just disc bulge at 2 levels.   Personally reviewed the MRI of the brain 06/01/2016. It is unchanged compared to the MRI of the brain 01/31/2016 and showed no acute findings.  Gait/strength/numbness:  She notes balance is off and she drags the left foot.   She stumbles and she reports falling twice.   SShe has tingling in the left foot > left hand.    She denies face numbness.    She notes weakness in the left foot and she feels there ia also mild weakness of the left hand.      Bladder/bowel: She feels bowel and bladder are doing well.  Vision:   She had right optic neuritis.   Acuity improved but bright lights bother that eye.   Colors are mildly desaturated out of right eye.    She notes reduced right peripheral vision.      Fatigue/sleep:  She reports a lot of fatigue and feels the Adderall is not helping her much (still helps some).     She sleeps well on diazepam (10 mg).      Sleep maintenance issues also better.   REVIEW OF SYSTEMS:  Constitutional: No fevers, chills, sweats, or change in appetite.   She has fatigue and  insomnia Eyes: as above Ear, nose and throat: No hearing loss, ear pain, nasal congestion, sore throat Cardiovascular: No chest pain, palpitations Respiratory:  No shortness of breath at rest or with exertion.   No wheezes GastrointestinaI: No nausea, vomiting, diarrhea, abdominal pain, fecal incontinence Genitourinary:  She reports hesitancy.     She notes nocturia. Musculoskeletal:  No neck pain, some back pain Integumentary: No rash, pruritus, skin lesions Neurological: as above Psychiatric: Some depression at this time.  Some anxiety Endocrine: No palpitations, diaphoresis, change in appetite, change in weigh or increased thirst  ALLERGIES: Allergies  Allergen Reactions  . Ciprofloxacin Anaphylaxis  . Influenza Vaccines Other (See Comments)    Hx of Guillain Barre     HOME MEDICATIONS: Outpatient Medications Prior to Visit  Medication Sig Dispense Refill  . amphetamine-dextroamphetamine (ADDERALL) 20 MG tablet Take 1 tablet (20 mg total) by mouth 2 (two) times daily. 60 tablet 0  . diazepam (VALIUM) 10 MG tablet TAKE 1 TABLET BY MOUTH AT BEDTIME 90 tablet 1  . DULoxetine (CYMBALTA) 60 MG capsule Take 1 capsule (60 mg total) by mouth daily. 30 capsule 5  . eletriptan (RELPAX) 40 MG tablet Take 1 tablet (40 mg total) by mouth as needed for migraine or headache. May repeat in 2 hours if headache persists or recurs. 10 tablet 5  . gabapentin (NEURONTIN) 100 MG capsule TAKE 3 CAPSULES BY MOUTH 3 TIMES A DAY 270 capsule 3  . methylPREDNISolone sodium succinate (SOLU-MEDROL) 1000 MG injection Solumedrol 1 gram IV, day one at [facility], day 2 and day 3 meds to be obtained thru [facility] and administered at home by pt.  She will have existing IV and training thru MD office/fim 1 each 2  . natalizumab (TYSABRI) 300 MG/15ML injection Inject into the vein.    Marland Kitchen topiramate (TOPAMAX) 50 MG tablet TAKE 1 TABLET BY MOUTH EVERY NIGHT AT BEDTIME 30 tablet 11  . traZODone (DESYREL) 100 MG tablet TAKE 1 TABLET BY MOUTH AT BEDTIME 30 tablet 5   Facility-Administered Medications Prior to Visit  Medication Dose Route Frequency Provider Last Rate Last Dose  . methylPREDNISolone sodium succinate (SOLU-MEDROL) 1,000 mg in sodium chloride 0.9 % 100 mL IVPB  1,000 mg Intravenous Continuous Asa Lente, MD   1,000 mg at 07/11/14 1335  . methylPREDNISolone sodium succinate (SOLU-MEDROL) 1,000 mg in sodium chloride 0.9 % 100 mL IVPB  1,000 mg Intravenous Continuous Asa Lente, MD   Stopped at 07/12/14 1530    PAST MEDICAL HISTORY: Past Medical History:  Diagnosis Date  . Headache   . Vision abnormalities     PAST SURGICAL HISTORY: Past Surgical History:  Procedure Laterality Date  . CESAREAN SECTION  2012  . CHOLECYSTECTOMY, LAPAROSCOPIC  2004  . VAGINAL  HYSTERECTOMY      FAMILY HISTORY: Family History  Problem Relation Age of Onset  . Diabetes type II Mother   . Hypertension Mother   . Fibromyalgia Mother   . Cardiomyopathy Father   . Heart attack Father     SOCIAL HISTORY:  Social History   Social History  . Marital status: Married    Spouse name: N/A  . Number of children: N/A  . Years of education: N/A   Occupational History  . Not on file.   Social History Main Topics  . Smoking status: Current Every Day Smoker    Packs/day: 1.00    Types: Cigarettes  . Smokeless tobacco: Never Used  . Alcohol use No  .  Drug use: No  . Sexual activity: Not on file   Other Topics Concern  . Not on file   Social History Narrative  . No narrative on file     PHYSICAL EXAM  Vitals:   06/15/16 0826  BP: 121/78  Pulse: 82  Resp: 18  Weight: 246 lb (111.6 kg)  Height: 5\' 8"  (1.727 m)    Body mass index is 37.4 kg/m.   General: The patient is well-developed and well-nourished and in no acute distress  Neurologic Exam  Mental status: The patient is alert and oriented x 3 at the time of the examination.  Normal memory and attention.     Cranial nerves: EOM intact.  No ptosis.  Decreased color vision right.   Right APD.      Speech is normal.    Normal facial strength and slightly decreased left sensation.   Palatal elevation and tongue protrusion are normal.   SCM/trapezius are strong.   Hearing is symmetric         Motor:  Muscle bulk and tone are normal. Strength is  5 / 5 in all 4 extremities.   Coordination:   Good RAM and FTN in arms.     Slightly reduced left Heel to shin  Sensory: On Sensory testing she reports decreased sensation to touch and vibration below the knee on the left.  Symmetric in arms       Gait and station: Station is normal.    Gait is wide and off balanced. Tandem gait is poor.. Romberg is positive.     DIAGNOSTIC DATA (LABS, IMAGING, TESTING) - I reviewed patient records, labs, notes,  testing and imaging myself where available.     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   Continue Tysabri as scheduled 300 mg IV q4weeks..   Her MRI of the brain shows only a couple of T2/FLAIR hyperintense foci and no atrophy. I am uncertain why she is having more physical use.  2.   The etiology of her pseudoseizures is uncertain but I would like her to see psychiatry as pseudoseizures are usually a maladaptive stress response. I will start lamotrigine as a mood stabilizer. 3.   Renew Adderall for fatigue, valium for sleep/anxiety, tamsulosin for bladder.  4.   Return in 3 months or sooner if there are new or worsening neurologic symptoms.     Richard A. Epimenio Foot, MD, PhD 06/15/2016, 8:36 AM Certified in Neurology, Clinical Neurophysiology, Sleep Medicine, Pain Medicine and Neuroimaging  [facility] [address], [address] [place], [place] [address] [phone]

## 2016-07-01 ENCOUNTER — Encounter: Payer: Self-pay | Admitting: *Deleted

## 2016-07-03 ENCOUNTER — Telehealth: Payer: Self-pay | Admitting: *Deleted

## 2016-07-03 NOTE — Telephone Encounter (Signed)
Pt. in for infusion appt. this morning and wanted to check on psych referral--she has not heard back about scheduling an appt. yet.  Will check with Dana/fim

## 2016-07-07 ENCOUNTER — Other Ambulatory Visit: Payer: Self-pay | Admitting: *Deleted

## 2016-07-07 DIAGNOSIS — F329 Major depressive disorder, single episode, unspecified: Secondary | ICD-10-CM

## 2016-07-07 DIAGNOSIS — F445 Conversion disorder with seizures or convulsions: Secondary | ICD-10-CM

## 2016-07-07 DIAGNOSIS — F419 Anxiety disorder, unspecified: Secondary | ICD-10-CM

## 2016-07-07 DIAGNOSIS — G35 Multiple sclerosis: Secondary | ICD-10-CM

## 2016-07-07 DIAGNOSIS — F32A Depression, unspecified: Secondary | ICD-10-CM

## 2016-07-07 DIAGNOSIS — R569 Unspecified convulsions: Secondary | ICD-10-CM

## 2016-07-07 NOTE — Telephone Encounter (Signed)
Faith Please place order I cant see order on my end . Thanks Annabelle Harman.

## 2016-07-08 NOTE — Telephone Encounter (Signed)
Referral has been sent to [facility]. Telephone [phone] - [phone]. [facility] will call patient to schedule. Tried to leave patient a message her lines are just ringing busy accept her work number did not want to leave details on her work [employer]. Thanks Annabelle Harman.

## 2016-07-08 NOTE — Telephone Encounter (Signed)
Noted/fim 

## 2016-07-22 DIAGNOSIS — Z0271 Encounter for disability determination: Secondary | ICD-10-CM

## 2016-07-31 ENCOUNTER — Other Ambulatory Visit: Payer: Self-pay | Admitting: *Deleted

## 2016-07-31 MED ORDER — AMPHETAMINE-DEXTROAMPHETAMINE 20 MG PO TABS: 20.0000 mg | ORAL_TABLET | Freq: Two times a day (BID) | ORAL | 0 refills | Status: DC

## 2016-08-31 ENCOUNTER — Telehealth: Payer: Self-pay | Admitting: *Deleted

## 2016-08-31 ENCOUNTER — Other Ambulatory Visit: Payer: Self-pay | Admitting: *Deleted

## 2016-08-31 ENCOUNTER — Other Ambulatory Visit (INDEPENDENT_AMBULATORY_CARE_PROVIDER_SITE_OTHER): Payer: Self-pay

## 2016-08-31 ENCOUNTER — Telehealth: Payer: Self-pay | Admitting: Neurology

## 2016-08-31 DIAGNOSIS — Z0289 Encounter for other administrative examinations: Secondary | ICD-10-CM

## 2016-08-31 DIAGNOSIS — G35 Multiple sclerosis: Secondary | ICD-10-CM

## 2016-08-31 DIAGNOSIS — Z79899 Other long term (current) drug therapy: Secondary | ICD-10-CM

## 2016-08-31 MED ORDER — AMPHETAMINE-DEXTROAMPHETAMINE 20 MG PO TABS: 20.0000 mg | ORAL_TABLET | Freq: Two times a day (BID) | ORAL | 0 refills | Status: DC

## 2016-08-31 NOTE — Telephone Encounter (Signed)
Adderall rx. provided during infusion appt.  YY signed as RAS is ooo this week/fim

## 2016-08-31 NOTE — Telephone Encounter (Signed)
Patient presented this morning routinely for her Tysabri infusion, accompanied by mother. Shortly after the infusion was started, she was noted to have muscle stiffening and proceeded to have decrease attentiveness, speech arrest, crying, and complained of nausea. She did not have any vomiting. She has been on Tysabri for the past 2 years without problems. On my examination, patient was crying and moaning, she was resisting passive movements and had stiffening of her hands, with fingers fanned out. After a few moments, she became more responsive and denied any nausea or headache and was willing to complete her Tysabri infusion. She had stable vital signs at the time. Altogether, this brief spell was most likely in keeping with a nonepileptic spell.

## 2016-09-01 LAB — CBC WITH DIFFERENTIAL/PLATELET
BASOS ABS: 0 10*3/uL (ref 0.0–0.2)
Basos: 0 %
EOS (ABSOLUTE): 0.1 10*3/uL (ref 0.0–0.4)
Eos: 1 %
Hematocrit: 43.4 % (ref 34.0–46.6)
Hemoglobin: 14.6 g/dL (ref 11.1–15.9)
Immature Grans (Abs): 0 10*3/uL (ref 0.0–0.1)
Immature Granulocytes: 0 %
LYMPHS ABS: 4.1 10*3/uL — AB (ref 0.7–3.1)
Lymphs: 42 %
MCH: 30 pg (ref 26.6–33.0)
MCHC: 33.6 g/dL (ref 31.5–35.7)
MCV: 89 fL (ref 79–97)
MONOS ABS: 0.7 10*3/uL (ref 0.1–0.9)
Monocytes: 7 %
Neutrophils Absolute: 4.8 10*3/uL (ref 1.4–7.0)
Neutrophils: 50 %
PLATELETS: 222 10*3/uL (ref 150–379)
RBC: 4.86 x10E6/uL (ref 3.77–5.28)
RDW: 15 % (ref 12.3–15.4)
WBC: 9.8 10*3/uL (ref 3.4–10.8)

## 2016-09-01 NOTE — Telephone Encounter (Signed)
Pt. has been seen mult. times in the ER for similar episodes, dx. with pseudoseizures.  Has also discussed this dx. with RAS in past appt.  Has pending appt. with psych to investigate underlying causes/fim

## 2016-09-09 ENCOUNTER — Emergency Department (HOSPITAL_COMMUNITY)
Admission: EM | Admit: 2016-09-09 | Discharge: 2016-09-10 | Disposition: A | Discharge status: Home / Self Care | Payer: BLUE CROSS/BLUE SHIELD | Attending: Emergency Medicine | Admitting: Emergency Medicine

## 2016-09-09 ENCOUNTER — Encounter (HOSPITAL_COMMUNITY): Payer: Self-pay | Admitting: Behavioral Health

## 2016-09-09 DIAGNOSIS — Z79899 Other long term (current) drug therapy: Secondary | ICD-10-CM | POA: Diagnosis not present

## 2016-09-09 DIAGNOSIS — R443 Hallucinations, unspecified: Secondary | ICD-10-CM | POA: Insufficient documentation

## 2016-09-09 DIAGNOSIS — F17210 Nicotine dependence, cigarettes, uncomplicated: Secondary | ICD-10-CM | POA: Diagnosis not present

## 2016-09-09 DIAGNOSIS — R569 Unspecified convulsions: Secondary | ICD-10-CM | POA: Diagnosis present

## 2016-09-09 DIAGNOSIS — F445 Conversion disorder with seizures or convulsions: Secondary | ICD-10-CM

## 2016-09-09 LAB — CBC WITH DIFFERENTIAL/PLATELET
Basophils Absolute: 0 10*3/uL (ref 0.0–0.1)
Basophils Relative: 0 %
Eosinophils Absolute: 0.2 10*3/uL (ref 0.0–0.7)
Eosinophils Relative: 2 %
HCT: 38.2 % (ref 36.0–46.0)
Hemoglobin: 12.8 g/dL (ref 12.0–15.0)
Lymphocytes Relative: 49 %
Lymphs Abs: 4.3 10*3/uL — ABNORMAL HIGH (ref 0.7–4.0)
MCH: 28.9 pg (ref 26.0–34.0)
MCHC: 33.5 g/dL (ref 30.0–36.0)
MCV: 86.2 fL (ref 78.0–100.0)
Monocytes Absolute: 0.6 10*3/uL (ref 0.1–1.0)
Monocytes Relative: 6 %
Neutro Abs: 3.8 10*3/uL (ref 1.7–7.7)
Neutrophils Relative %: 43 %
Platelets: 200 10*3/uL (ref 150–400)
RBC: 4.43 MIL/uL (ref 3.87–5.11)
RDW: 14.1 % (ref 11.5–15.5)
WBC: 8.9 10*3/uL (ref 4.0–10.5)

## 2016-09-09 LAB — URINALYSIS, ROUTINE W REFLEX MICROSCOPIC
Bilirubin Urine: NEGATIVE
Glucose, UA: NEGATIVE mg/dL
Hgb urine dipstick: NEGATIVE
Ketones, ur: NEGATIVE mg/dL
Leukocytes, UA: NEGATIVE
Nitrite: NEGATIVE
Protein, ur: NEGATIVE mg/dL
Specific Gravity, Urine: 1.015 (ref 1.005–1.030)
pH: 6 (ref 5.0–8.0)

## 2016-09-09 LAB — BASIC METABOLIC PANEL
Anion gap: 7 (ref 5–15)
BUN: 11 mg/dL (ref 6–20)
CO2: 23 mmol/L (ref 22–32)
Calcium: 8.5 mg/dL — ABNORMAL LOW (ref 8.9–10.3)
Chloride: 108 mmol/L (ref 101–111)
Creatinine, Ser: 0.76 mg/dL (ref 0.44–1.00)
GFR calc Af Amer: >60 mL/min (ref >60)
GFR calc non Af Amer: >60 mL/min (ref >60)
Glucose, Bld: 87 mg/dL (ref 65–99)
Potassium: 3.9 mmol/L (ref 3.5–5.1)
Sodium: 138 mmol/L (ref 135–145)

## 2016-09-09 LAB — RAPID URINE DRUG SCREEN, HOSP PERFORMED
Amphetamines: POSITIVE — AB
Barbiturates: NOT DETECTED
Benzodiazepines: NOT DETECTED
Cocaine: NOT DETECTED
Opiates: NOT DETECTED
Tetrahydrocannabinol: NOT DETECTED

## 2016-09-09 LAB — ETHANOL: Alcohol, Ethyl (B): <5 mg/dL (ref <5)

## 2016-09-09 MED ORDER — KETOROLAC TROMETHAMINE 30 MG/ML IJ SOLN
30.0000 mg | Freq: Once | INTRAMUSCULAR | Status: AC
  Administered 2016-09-09: 30 mg via INTRAVENOUS
  Filled 2016-09-09: qty 1

## 2016-09-09 MED ORDER — DIPHENHYDRAMINE HCL 50 MG/ML IJ SOLN
25.0000 mg | Freq: Once | INTRAMUSCULAR | Status: AC
  Administered 2016-09-09: 25 mg via INTRAVENOUS
  Filled 2016-09-09: qty 1

## 2016-09-09 MED ORDER — METOCLOPRAMIDE HCL 5 MG/ML IJ SOLN
10.0000 mg | Freq: Once | INTRAMUSCULAR | Status: AC
  Administered 2016-09-09: 10 mg via INTRAVENOUS
  Filled 2016-09-09: qty 2

## 2016-09-09 NOTE — ED Notes (Addendum)
Explained to pt process of being placed inpatient for psychiatric treatment. Pt requesting time to speak with her husband and family. Will allow pt privacy to discuss plan with family, will have pt change into purple scrubs and remove cords from pt's room. Pt denies SI/HI

## 2016-09-09 NOTE — ED Notes (Signed)
TTS at bedside. 

## 2016-09-09 NOTE — ED Notes (Signed)
Main lab called about pt blood results. Per main lab, no specimens were received. This nurse spoke with phlebotomy, will recollect labs.

## 2016-09-09 NOTE — ED Notes (Signed)
Dinner tray ordered.

## 2016-09-09 NOTE — ED Notes (Addendum)
Pt asleep at this time, family updated with plan of care. Will move pt to next empty available room for TTS assessment.

## 2016-09-09 NOTE — ED Notes (Signed)
Pt belongings inventoried by this nurse. Includes 1 grey tshirt, 1 pair of black pants, 1 pair of grey sneakers, and 1 pair of socks. Inventory verified with Caitlynn, RN. Will also have pt verify when pt is awake.

## 2016-09-09 NOTE — BH Assessment (Signed)
Tele Assessment Note   Caitlin [name] is an 31 y.o. female. Pt denies SI/HI. Pt reports visual and auditory hallucinations. Pt states she sees bugs crawling from the carpet up the wall but no one sees them. The Pt also states that she's paranoid and hears people saying they are coming to get her. Pt states she was diagnosed with MS in 2015 and began having psuedo-seizures in 2016. Pt states her psuedo-seizures are occurring more often. Pt reports chronic fatigue as well. Pt denies SA and abuse. Pt denies previous hospitalization. Pt states she is currently prescribed Cymbalta, Klonopin, Lamictal, and Adderall.  Per Jacki Cones, NP Pt meets inpatient criteria. TTS to seek placement.  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 Headache   . Vision abnormalities     Past Surgical History:  Procedure Laterality Date  . CESAREAN SECTION  2012  . CHOLECYSTECTOMY, LAPAROSCOPIC  2004  . VAGINAL HYSTERECTOMY      Family History:  Family History  Problem Relation Age of Onset  . Diabetes type II Mother   . Hypertension Mother   . Fibromyalgia Mother   . Cardiomyopathy Father   . Heart attack Father     Social History:  reports that she has been smoking Cigarettes.  She has been smoking about 1.00 pack per day. She has never used smokeless tobacco. She reports that she does not drink alcohol or use drugs.  Additional Social History:  Alcohol / Drug Use Pain Medications: please see mar Prescriptions: please see mar Over the Counter: please see mar History of alcohol / drug use?: No history of alcohol / drug abuse Longest period of sobriety (when/how long): NA  CIWA: CIWA-Ar BP: 102/72 Pulse Rate: (!) 59 COWS:    PATIENT STRENGTHS: (choose at least two) Active sense of humor Communication skills  Allergies:  Allergies  Allergen Reactions  . Ciprofloxacin Anaphylaxis  . Influenza Vaccines Other (See Comments)     Hx of Guillain Barre    Home Medications:  (Not in a hospital admission)  OB/GYN Status:  No LMP recorded. Patient has had a hysterectomy.  General Assessment Data Location of Assessment: [facility] ED TTS Assessment: In system Is this a Tele or Face-to-Face Assessment?: Tele Assessment Is this an Initial Assessment or a Re-assessment for this encounter?: Initial Assessment Marital status: Married [place] name: NA Is patient pregnant?: No Pregnancy Status: No Living Arrangements: Spouse/significant other, Children Can pt return to current living arrangement?: Yes Admission Status: Voluntary Is patient capable of signing voluntary admission?: Yes Referral Source: Self/Family/Friend Insurance type: BCBS     Crisis Care Plan Living Arrangements: Spouse/significant other, Children Legal Guardian: Other: (self) Name of Psychiatrist: NA (has 1st appointment in June 2018) Name of Therapist: NA  Education Status Is patient currently in school?: No Current Grade: NA Highest grade of school patient has completed: some college Name of school: NA Contact person: NA  Risk to self with the past 6 months Suicidal Ideation: No Has patient been a risk to self within the past 6 months prior to admission? : No Suicidal Intent: No Has patient had any suicidal intent within the past 6 months prior to admission? : No Is patient at risk for suicide?: No Suicidal Plan?: No Has patient had any suicidal plan within the past 6 months prior to admission? : No Access to Means: No What has been your use of drugs/alcohol within the last 12 months?: NA Previous Attempts/Gestures:  No How many times?: 0 Other Self Harm Risks: NA Triggers for Past Attempts: None known Intentional Self Injurious Behavior: None Family Suicide History: No Recent stressful life event(s): Other (Comment) (MS) Persecutory voices/beliefs?: No Depression: Yes Depression Symptoms: Despondent, Tearfulness, Isolating, Fatigue,  Guilt, Loss of interest in usual pleasures, Feeling worthless/self pity, Feeling angry/irritable Substance abuse history and/or treatment for substance abuse?: No Suicide prevention information given to non-admitted patients: Not applicable  Risk to Others within the past 6 months Homicidal Ideation: No Does patient have any lifetime risk of violence toward others beyond the six months prior to admission? : No Thoughts of Harm to Others: No Current Homicidal Intent: No Current Homicidal Plan: No Access to Homicidal Means: No Identified Victim: NA History of harm to others?: No Assessment of Violence: None Noted Violent Behavior Description: NA Does patient have access to weapons?: No Criminal Charges Pending?: No Does patient have a court date: No Is patient on probation?: No  Psychosis Hallucinations: Auditory, Visual Delusions: None noted  Mental Status Report Appearance/Hygiene: Unremarkable Eye Contact: Poor Motor Activity: Freedom of movement Speech: Slow, Slurred Level of Consciousness: Drowsy, Crying Mood: Depressed Affect: Depressed Anxiety Level: Minimal Thought Processes: Coherent, Relevant Judgement: Impaired Orientation: Person, Place, Time, Situation Obsessive Compulsive Thoughts/Behaviors: None  Cognitive Functioning Concentration: Normal Memory: Recent Intact, Remote Intact IQ: Average Insight: Fair Impulse Control: Fair Appetite: Fair Weight Loss: 0 Weight Gain: 0 Sleep: Decreased Total Hours of Sleep: 6 Vegetative Symptoms: None  ADLScreening [facility] Assessment Services) Patient's cognitive ability adequate to safely complete daily activities?: Yes Patient able to express need for assistance with ADLs?: Yes Independently performs ADLs?: Yes (appropriate for developmental age)  Prior Inpatient Therapy Prior Inpatient Therapy: No Prior Therapy Dates: NA Prior Therapy Facilty/Provider(s): NA Reason for Treatment: NA  Prior Outpatient  Therapy Prior Outpatient Therapy: No Prior Therapy Dates: NA Prior Therapy Facilty/Provider(s): NA Reason for Treatment: NA Does patient have an ACCT team?: No Does patient have Intensive In-House Services?  : No Does patient have [facility] services? : No Does patient have P4CC services?: No  ADL Screening (condition at time of admission) Patient's cognitive ability adequate to safely complete daily activities?: Yes Is the patient deaf or have difficulty hearing?: No Does the patient have difficulty seeing, even when wearing glasses/contacts?: No Does the patient have difficulty concentrating, remembering, or making decisions?: No Patient able to express need for assistance with ADLs?: Yes Does the patient have difficulty dressing or bathing?: No Independently performs ADLs?: Yes (appropriate for developmental age) Does the patient have difficulty walking or climbing stairs?: No Weakness of Legs: None Weakness of Arms/Hands: None       Abuse/Neglect Assessment (Assessment to be complete while patient is alone) Physical Abuse: Denies Verbal Abuse: Denies Sexual Abuse: Denies Exploitation of patient/patient's resources: Denies Self-Neglect: Denies     Merchant navy officer (For Healthcare) Does Patient Have a Medical Advance Directive?: No    Additional Information 1:1 In Past 12 Months?: No CIRT Risk: No Elopement Risk: No Does patient have medical clearance?: Yes     Disposition:  Disposition Initial Assessment Completed for this Encounter: Yes Disposition of Patient: Inpatient treatment program Type of inpatient treatment program: Adult  Emmit Pomfret 09/09/2016 6:29 PM

## 2016-09-09 NOTE — ED Notes (Signed)
Pt dinner tray by the bedside. Valuables inventoried and signed by patient and this nurse. Pt wanded by security. No one currently in security office at this time, told by security to call back in approx 30 mins. Pt valuables left in the sealed envelope by the bedside.

## 2016-09-09 NOTE — ED Notes (Signed)
Phlebotomy at bedside.

## 2016-09-09 NOTE — ED Provider Notes (Signed)
Assumed care from PA Hedges @ 1700; see that note for full H&P. Pt in stable condition at the time of transfer. Currently awaiting psych labs & TTS consult for pseudoseizures & hallucinations.  UPDATE: Medically clear for Psych dispo. TTS will hold the Pt for inpatient treatment.   Forest Becker, MD 09/09/16 2209    Laurence Spates, MD 09/10/16 [phone]

## 2016-09-09 NOTE — ED Triage Notes (Addendum)
Patient from primary care for seizure, history of pseudoseizure.  Patient is lethargic and oriented at this time.  20g saline lock in right hand.  Family member at bedside states patient has been having hallucinations since last week, and last night became paranoid thinking someone was going to break into the home and hurt her.  The man that lives with her states he has locked up his weapons.

## 2016-09-09 NOTE — ED Provider Notes (Signed)
[facility]-EMERGENCY DEPT Provider Note   CSN: [id number] Arrival date & time: 09/09/16  1357     History   Chief Complaint Chief Complaint  Patient presents with  . Seizures    HPI Caitlin [name] is a 31 y.o. female.  HPI   31 year old female presents today with complaints of seizure and hallucinations.  Patient has a history of multiple sclerosis, pseudoseizures.  Patient currently being followed as an outpatient by [facility].  Patient has been diagnosed with pseudoseizures, she has had EEG consistent with pseudoseizures.  Husband notes that she has had numerous seizures today, reporting this is not abnormal for her.  He reports generalized shaking approximately 10 minutes.  EMS notes that they gave her normal saline which promptly resolved her pseudoseizure in route.  At the time of evaluation patient reports that she is having a generalized headache which is typical status post seizure.  She reports left-sided numbness and weakness that has been present since September, no changes in baseline mental status nor neurological functioning.     Husband notes that patient has been hallucinating recently.  He notes last week she was seeing bugs crawling up the wall, he did not see any bugs.  He notes patient was very paranoid yesterday reporting that she was hearing people breaking in the house, and made has been locked the doors.  He notes this is new behavior over the last several weeks.  She denies any suicidal or homicidal idea   Past Medical History:  Diagnosis Date  . Headache   . Vision abnormalities     Patient Active Problem List   Diagnosis Date Noted  . 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 Imbalance 09/25/2015  . Encounter for follow-up examination after completed treatment for conditions other than malignant neoplasm 09/10/2015  . 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 Cervical disc disease 07/26/2015  . Degeneration of intervertebral disc of  lumbar region 07/26/2015  . Lumbar radiculopathy 07/26/2015  . 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 Acute low back pain 07/09/2015  . Elevated lymphocyte count 02/12/2015  . Pain of upper abdomen 02/12/2015  . Hemorrhage, petechial 02/12/2015  . Gonalgia 01/29/2015  . Knee MCL sprain 01/29/2015  . Other headache syndrome 11/26/2014  . Neck pain 11/26/2014  . Headache disorder 11/26/2014  . Encounter for surgical follow-up care 11/14/2014  . Other fatigue 11/13/2014  . Fatigue 11/13/2014  . Incomplete bladder emptying 11/09/2014  . 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 Gait disturbance 07/11/2014  . Abnormal gait 07/11/2014  . Multiple sclerosis (HCC) 06/20/2014  . Optic neuritis 06/20/2014  . Numbness 06/20/2014  . 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 Adjustment disorder with mixed anxiety and depressed mood 06/20/2014  . Urinary frequency 06/20/2014  . FOM (frequency of micturition) 06/20/2014  . Dislocated patella 11/28/2013    Past Surgical History:  Procedure Laterality Date  . CESAREAN SECTION  2012  . CHOLECYSTECTOMY, LAPAROSCOPIC  2004  . VAGINAL HYSTERECTOMY      OB History    No data available       Home Medications    Prior to Admission medications   Medication Sig Start Date End Date Taking? Authorizing Provider  amphetamine-dextroamphetamine (ADDERALL) 20 MG tablet Take 1 tablet (20 mg total) by mouth 2 (two) times daily. 08/31/16   Levert Feinstein, MD  clonazePAM (KLONOPIN) 1 MG tablet Take 1 tablet (1 mg total) by mouth 2 (two) times daily. 06/15/16  Asa Lente, MD  DULoxetine (CYMBALTA) 60 MG capsule Take 1 capsule (60 mg total) by mouth daily. 02/18/16   Asa Lente, MD  eletriptan (RELPAX) 40 MG tablet Take 1 tablet (40 mg total) by mouth as needed for migraine or headache. May repeat in 2 hours if headache persists or recurs. 12/07/14   Asa Lente, MD  gabapentin  (NEURONTIN) 100 MG capsule TAKE 3 CAPSULES BY MOUTH 3 TIMES A DAY 05/13/16   Asa Lente, MD  lamoTRIgine (LAMICTAL) 100 MG tablet Take 1 tablet (100 mg total) by mouth 2 (two) times daily. 06/15/16   Asa Lente, MD  methylPREDNISolone sodium succinate (SOLU-MEDROL) 1000 MG injection Solumedrol 1 gram IV, day one at [facility], day 2 and day 3 meds to be obtained thru [facility] and administered at home by pt.  She will have existing IV and training thru MD office/fim 11/23/14   Asa Lente, MD  natalizumab (TYSABRI) 300 MG/15ML injection Inject into the vein.    Historical Provider, MD  topiramate (TOPAMAX) 50 MG tablet TAKE 1 TABLET BY MOUTH EVERY NIGHT AT BEDTIME 06/02/16   Asa Lente, MD  traZODone (DESYREL) 100 MG tablet TAKE 1 TABLET BY MOUTH AT BEDTIME 06/08/16   Asa Lente, MD    Family History Family History  Problem Relation Age of Onset  . Diabetes type II Mother   . Hypertension Mother   . Fibromyalgia Mother   . Cardiomyopathy Father   . Heart attack Father     Social History Social History  Substance Use Topics  . Smoking status: Current Every Day Smoker    Packs/day: 1.00    Types: Cigarettes  . Smokeless tobacco: Never Used  . Alcohol use No     Allergies   Ciprofloxacin and Influenza vaccines   Review of Systems Review of Systems   Physical Exam Updated Vital Signs BP 124/85 (BP Location: Right Arm)   Pulse 79   Temp 98.1 F (36.7 C) (Oral)   Resp 16   SpO2 94%   Physical Exam  Constitutional: She is oriented to person, place, and time. She appears well-developed and well-nourished.  HENT:  Head: Normocephalic and atraumatic.  Eyes: Conjunctivae are normal. Pupils are equal, round, and reactive to light. Right eye exhibits no discharge. Left eye exhibits no discharge. No scleral icterus.  Neck: Normal range of motion. No JVD present. No tracheal deviation present.  Pulmonary/Chest: Effort normal. No stridor.  Neurological: She  is alert and oriented to person, place, and time. She has normal strength. A sensory deficit is present. No cranial nerve deficit. Coordination normal. GCS eye subscore is 4. GCS verbal subscore is 5. GCS motor subscore is 6.  Decreased sensation to the left upper and lower extremities.  Strength 5 out of 5  Psychiatric: She has a normal mood and affect. Her behavior is normal. Judgment and thought content normal.  Nursing note and vitals reviewed.    ED Treatments / Results  Labs (all labs ordered are listed, but only abnormal results are displayed) Labs Reviewed  URINALYSIS, ROUTINE W REFLEX MICROSCOPIC - Abnormal; Notable for the following:       Result Value   APPearance HAZY (*)    All other components within normal limits  CBC WITH DIFFERENTIAL/PLATELET  BASIC METABOLIC PANEL  RAPID URINE DRUG SCREEN, HOSP PERFORMED  ETHANOL    EKG  EKG Interpretation None       Radiology No results found.  Procedures Procedures (including critical care time)  Medications Ordered in ED Medications  ketorolac (TORADOL) 30 MG/ML injection 30 mg (30 mg Intravenous Given 09/09/16 1604)  metoCLOPramide (REGLAN) injection 10 mg (10 mg Intravenous Given 09/09/16 1604)  diphenhydrAMINE (BENADRYL) injection 25 mg (25 mg Intravenous Given 09/09/16 1604)     Initial Impression / Assessment and Plan / ED Course  I have reviewed the triage vital signs and the nursing notes.  Pertinent labs & imaging results that were available during my care of the patient were reviewed by me and considered in my medical decision making (see chart for details).    Final Clinical Impressions(s) / ED Diagnoses   Final diagnoses:  Pseudoseizure  Hallucination   Labs: CBC, CMP, rapid urine drug screen, urinalysis  Imaging:  Consults:  Therapeutics:  Discharge Meds:   Assessment/Plan: 31 year old female with history of pseudoseizures presents status post seizure.  This is not atypical for the patient.   She is currently being followed by neurology as an outpatient.  No need for further evaluation of seizure.  Patient also having hallucinations which is new over the last several weeks.  Patient will have clearing labs and likely TTS consultation after medical clearance.   New Prescriptions New Prescriptions   No medications on file     Eyvonne Mechanic, PA-C 09/09/16 1635    Rolland Porter, MD 09/22/16 [phone]

## 2016-09-09 NOTE — ED Notes (Signed)
ED Provider at bedside. 

## 2016-09-10 MED ORDER — AMPHETAMINE-DEXTROAMPHETAMINE 10 MG PO TABS
20.0000 mg | ORAL_TABLET | Freq: Two times a day (BID) | ORAL | Status: DC
  Administered 2016-09-10: 20 mg via ORAL
  Filled 2016-09-10: qty 2

## 2016-09-10 MED ORDER — TOPIRAMATE 25 MG PO TABS: 50.0000 mg | ORAL_TABLET | Freq: Every day | ORAL | Status: DC

## 2016-09-10 MED ORDER — DULOXETINE HCL 60 MG PO CPEP
60.0000 mg | ORAL_CAPSULE | Freq: Every day | ORAL | Status: DC
  Administered 2016-09-10: 60 mg via ORAL
  Filled 2016-09-10 (×2): qty 1

## 2016-09-10 MED ORDER — TRAZODONE HCL 100 MG PO TABS: 100.0000 mg | ORAL_TABLET | Freq: Every day | ORAL | Status: DC

## 2016-09-10 MED ORDER — GABAPENTIN 300 MG PO CAPS
300.0000 mg | ORAL_CAPSULE | Freq: Three times a day (TID) | ORAL | Status: DC
  Administered 2016-09-10: 300 mg via ORAL
  Filled 2016-09-10: qty 1

## 2016-09-10 MED ORDER — CLONAZEPAM 0.5 MG PO TABS
1.0000 mg | ORAL_TABLET | Freq: Two times a day (BID) | ORAL | Status: DC
  Filled 2016-09-10 (×2): qty 2

## 2016-09-10 MED ORDER — LAMOTRIGINE 100 MG PO TABS
100.0000 mg | ORAL_TABLET | Freq: Two times a day (BID) | ORAL | Status: DC
  Administered 2016-09-10: 100 mg via ORAL
  Filled 2016-09-10 (×2): qty 1

## 2016-09-10 NOTE — ED Notes (Signed)
Pt on phone with [facility] -- Diet Coke given -- breakfast tray delivered -- lunch tray ordered [id number]

## 2016-09-10 NOTE — BHH Counselor (Signed)
Re-assessment- Pt denies SI/HI and AVH.  Pt states she would like to begin outpatient services in [place].  Per Jacki Cones, NP can be D/C to f/u with outpatient services in [place]. [place] resources faxed to Pt.  Wolfgang Phoenix, [facility] Triage Specialist

## 2016-09-10 NOTE — ED Provider Notes (Signed)
Patient seen and evaluated by the psychiatric team and deemed safe for discharge from a psychiatric standpoint.  Patient is safe for discharge from the ER from a medical standpoint  Please see psychiatric team consultation note for complete details   Azalia Bilis, MD 09/10/16 1100

## 2016-09-10 NOTE — ED Notes (Signed)
Called service response for pt tray as it has still not been delivered.

## 2016-09-10 NOTE — ED Notes (Signed)
Ordering breakfast tray for patient. To give daily meds with breakfast

## 2016-09-11 ENCOUNTER — Other Ambulatory Visit: Payer: Self-pay | Admitting: Neurology

## 2016-09-25 ENCOUNTER — Encounter: Payer: Self-pay | Admitting: Neurology

## 2016-09-25 ENCOUNTER — Other Ambulatory Visit (INDEPENDENT_AMBULATORY_CARE_PROVIDER_SITE_OTHER): Payer: Self-pay

## 2016-09-25 ENCOUNTER — Telehealth: Payer: Self-pay | Admitting: *Deleted

## 2016-09-25 ENCOUNTER — Other Ambulatory Visit: Payer: Self-pay | Admitting: *Deleted

## 2016-09-25 DIAGNOSIS — Z79899 Other long term (current) drug therapy: Secondary | ICD-10-CM

## 2016-09-25 DIAGNOSIS — G35 Multiple sclerosis: Secondary | ICD-10-CM

## 2016-09-25 DIAGNOSIS — R269 Unspecified abnormalities of gait and mobility: Secondary | ICD-10-CM

## 2016-09-25 DIAGNOSIS — Z0289 Encounter for other administrative examinations: Secondary | ICD-10-CM

## 2016-09-25 MED ORDER — AMPHETAMINE-DEXTROAMPHETAMINE 20 MG PO TABS: 20.0000 mg | ORAL_TABLET | Freq: Two times a day (BID) | ORAL | 0 refills | Status: DC

## 2016-09-25 NOTE — Telephone Encounter (Signed)
Adderall rx. provided during infusion appt/fim 

## 2016-09-26 LAB — HEPATIC FUNCTION PANEL
ALK PHOS: 62 IU/L (ref 39–117)
ALT: 14 IU/L (ref 0–32)
AST: 18 IU/L (ref 0–40)
Albumin: 4.3 g/dL (ref 3.5–5.5)
BILIRUBIN, DIRECT: 0.1 mg/dL (ref 0.00–0.40)
Bilirubin Total: 0.3 mg/dL (ref 0.0–1.2)
Total Protein: 6.5 g/dL (ref 6.0–8.5)

## 2016-09-30 ENCOUNTER — Telehealth: Payer: Self-pay | Admitting: *Deleted

## 2016-09-30 NOTE — Telephone Encounter (Signed)
I have spoken with Caitlin [name] this morning and per RAS, advised labs done in our office are ok--will call her when JCV is back.  She verbalized understanding of same/fim

## 2016-09-30 NOTE — Telephone Encounter (Signed)
-----   Message from Asa Lente, MD sent at 09/26/2016  5:23 PM EDT ----- Please let the patient know that the lab work is fine.

## 2016-10-06 ENCOUNTER — Encounter: Payer: Self-pay | Admitting: *Deleted

## 2016-10-12 ENCOUNTER — Other Ambulatory Visit: Payer: Self-pay | Admitting: Neurology

## 2016-10-23 ENCOUNTER — Other Ambulatory Visit: Payer: Self-pay | Admitting: Neurology

## 2016-10-23 MED ORDER — AMPHETAMINE-DEXTROAMPHETAMINE 20 MG PO TABS: 20.0000 mg | ORAL_TABLET | Freq: Two times a day (BID) | ORAL | 0 refills | Status: DC

## 2016-11-02 ENCOUNTER — Telehealth: Payer: Self-pay | Admitting: Neurology

## 2016-11-02 DIAGNOSIS — M21372 Foot drop, left foot: Secondary | ICD-10-CM

## 2016-11-02 DIAGNOSIS — R269 Unspecified abnormalities of gait and mobility: Secondary | ICD-10-CM

## 2016-11-02 NOTE — Telephone Encounter (Signed)
LMTC.  I need a fax # to send order to/fim

## 2016-11-02 NOTE — Telephone Encounter (Signed)
Steward Drone with [facility] called office in reference to needing an order for left foot AFO.  Please call with any questions.

## 2016-11-02 NOTE — Addendum Note (Signed)
Addended by: Candis Schatz I on: 11/02/2016 03:41 PM   Modules accepted: Orders

## 2016-11-03 NOTE — Telephone Encounter (Signed)
2nd message left for Steward Drone to call back with fax# I can send order to/fim

## 2016-11-03 NOTE — Telephone Encounter (Signed)
Steward Drone with [facility] called to give fax number to fax order for left foot AFO.  Fax #- [phone].

## 2016-11-04 NOTE — Telephone Encounter (Signed)
Order for AFO faxed as requested/fim

## 2016-11-09 ENCOUNTER — Other Ambulatory Visit: Payer: Self-pay | Admitting: Neurology

## 2016-11-13 ENCOUNTER — Other Ambulatory Visit: Payer: Self-pay | Admitting: Neurology

## 2016-11-20 ENCOUNTER — Telehealth: Payer: Self-pay | Admitting: *Deleted

## 2016-11-20 MED ORDER — AMPHETAMINE-DEXTROAMPHETAMINE 20 MG PO TABS: 20.0000 mg | ORAL_TABLET | Freq: Two times a day (BID) | ORAL | 0 refills | Status: DC

## 2016-11-20 NOTE — Telephone Encounter (Signed)
Rx. provided during infusion appt./fim

## 2016-11-30 ENCOUNTER — Telehealth: Payer: Self-pay | Admitting: Neurology

## 2016-11-30 NOTE — Telephone Encounter (Signed)
Pt calling to inform that all weekend and currently there is a pain to the bone from her left lower leg to her foot.There is a bone pain in left arm to fingers.  The Gabapentin is not working she is asking to be called as soon as possible.

## 2016-11-30 NOTE — Telephone Encounter (Signed)
I have spoken with Caitlin [name] this morning.  She c/o neck, left arm, left leg pain onset 2-3 days ago.  Appt. for eval given tomorrow 9am/fim

## 2016-12-01 ENCOUNTER — Ambulatory Visit (INDEPENDENT_AMBULATORY_CARE_PROVIDER_SITE_OTHER): Payer: BLUE CROSS/BLUE SHIELD | Admitting: Neurology

## 2016-12-01 ENCOUNTER — Encounter: Payer: Self-pay | Admitting: Neurology

## 2016-12-01 VITALS — BP 146/97 | HR 84 | Resp 18 | Ht 68.0 in | Wt 246.0 lb

## 2016-12-01 DIAGNOSIS — R269 Unspecified abnormalities of gait and mobility: Secondary | ICD-10-CM

## 2016-12-01 DIAGNOSIS — G35 Multiple sclerosis: Secondary | ICD-10-CM

## 2016-12-01 DIAGNOSIS — F4323 Adjustment disorder with mixed anxiety and depressed mood: Secondary | ICD-10-CM | POA: Diagnosis not present

## 2016-12-01 DIAGNOSIS — F445 Conversion disorder with seizures or convulsions: Secondary | ICD-10-CM

## 2016-12-01 DIAGNOSIS — M542 Cervicalgia: Secondary | ICD-10-CM

## 2016-12-01 MED ORDER — ETODOLAC 400 MG PO TABS: 400.0000 mg | ORAL_TABLET | Freq: Two times a day (BID) | ORAL | 5 refills | Status: DC

## 2016-12-01 NOTE — Progress Notes (Signed)
[facility] PATIENT: Caitlin [name] DOB: 12/13/1985  REFERRING CLINICIAN: Fredderick Severance  HISTORY FROM: patient REASON FOR VISIT: MS   HISTORICAL  CHIEF COMPLAINT:  Chief Complaint  Patient presents with  . Multiple Sclerosis    Sts. she continues to tolerate Tysabri well.  JCV ab last checked 09/25/16, and was indeterminate, but assay was negative.  Sts. onset 3 days ago of neck pain, pain in left hand, left lower outer leg/ankle into foot.  Today sts. pain is also in right hand, right lower leg.  No relief with Gabapentin, Ibuprofen.  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      MS:   She is on Tysabri and tolerates it well. JCV antibodies 09/27/2016 were indeterminate (0.2 to 0.4). She reports weakness in the left leg. Despite a lot of symptoms, she has a very low plaque burden on the brain MRI and no lesions have been seen in her spine.   MS was diagnosed after an episode of optic neuritis and having a couple small non-specific foci on brain MRI.    A couple years earlier she had a possible transverse myelitis (vs mild GBS).        MRI of the cervical spine 03/02/2016 was essentially normal. There was no evidence of cervical myelopathy and just disc bulge at 2 levels.  The MRI of the brain 06/01/2016 is unchanged compared to the MRI of the brain 01/31/2016 and showed no acute findings.     Pain:  She reports being in a lot more pain and is sitting in a wheelchair today.   Her worse pain is in her hands and legs and neck into her shoulders.   She does not feel she is getting any benefit from her medications.   She had lumbar surgery at L5S1 April 2017.     PseudoseizuresShe went to the [facility] ED 09/09/16 and was felt to be having pseudoseizures and was d/c home.   Earlier in the year, she was hospitalized and had EEG during one of the  spells c/w pseudoseizures.    Of note, she is on lamotrigine and Topamax for pain and psych issues.     Mood:  She has anxiety and depression. She has pseudoseizures.   She is on Cymbalta (10 mg at bedtime) without much benefit.   She was seen once by psychiatry in the hospital.   She sees Behavioral Health at [facility] every 2-3 weeks.   .     Gait/strength/numbness:  She reports poor balance and drags the left foot. She reports falling several times.    She notes weakness in the left foot and she feels there ia also mild weakness of the left hand.     She is doing therapy twice weekly  Bladder/bowel: She feels bowel and bladder are doing well.  Vision:   She had right optic neuritis.   Acuity improved but bright lights bother that eye.   Colors are mildly desaturated out of right eye.    She notes reduced right peripheral vision.      Fatigue/sleep:  She reports a lot of fatigue despite 20 mg x 2 Adderall daily.     She falls asleep well with clonazepam 1 mg and trazodone 100 mg but still has sleep maintenance issues.      REVIEW OF SYSTEMS:  Constitutional: No  fevers, chills, sweats, or change in appetite.   She has fatigue and insomnia Eyes: as above Ear, nose and throat: No hearing loss, ear pain, nasal congestion, sore throat Cardiovascular: No chest pain, palpitations Respiratory:  No shortness of breath at rest or with exertion.   No wheezes GastrointestinaI: No nausea, vomiting, diarrhea, abdominal pain, fecal incontinence Genitourinary:  She reports hesitancy.     She notes nocturia. Musculoskeletal:  No neck pain, some back pain Integumentary: No rash, pruritus, skin lesions Neurological: as above Psychiatric: Some depression at this time.  Some anxiety Endocrine: No palpitations, diaphoresis, change in appetite, change in weigh or increased thirst  ALLERGIES: Allergies  Allergen Reactions  . Ciprofloxacin Anaphylaxis  . Influenza Vaccines Other (See Comments)    Hx of  Guillain Barre    HOME MEDICATIONS: Outpatient Medications Prior to Visit  Medication Sig Dispense Refill  . amphetamine-dextroamphetamine (ADDERALL) 20 MG tablet Take 1 tablet (20 mg total) by mouth 2 (two) times daily. 60 tablet 0  . clonazePAM (KLONOPIN) 1 MG tablet TAKE ONE TABLET TWICE DAILY 60 tablet 0  . eletriptan (RELPAX) 40 MG tablet Take 1 tablet (40 mg total) by mouth as needed for migraine or headache. May repeat in 2 hours if headache persists or recurs. 10 tablet 5  . gabapentin (NEURONTIN) 100 MG capsule TAKE 3 CAPSULES BY MOUTH 3 TIMES A DAY 270 capsule 3  . lamoTRIgine (LAMICTAL) 100 MG tablet Take 1 tablet (100 mg total) by mouth 2 (two) times daily. 60 tablet 5  . natalizumab (TYSABRI) 300 MG/15ML injection Inject into the vein.    Marland Kitchen topiramate (TOPAMAX) 50 MG tablet TAKE 1 TABLET BY MOUTH EVERY NIGHT AT BEDTIME 30 tablet 11  . traZODone (DESYREL) 100 MG tablet TAKE 1 TABLET BY MOUTH AT BEDTIME 30 tablet 5  . DULoxetine (CYMBALTA) 60 MG capsule Take 1 capsule (60 mg total) by mouth daily. 30 capsule 5  . methylPREDNISolone sodium succinate (SOLU-MEDROL) 1000 MG injection Solumedrol 1 gram IV, day one at [facility], day 2 and day 3 meds to be obtained thru [facility] and administered at home by pt.  She will have existing IV and training thru MD office/fim (Patient not taking: Reported on 12/01/2016) 1 each 2   Facility-Administered Medications Prior to Visit  Medication Dose Route Frequency Provider Last Rate Last Dose  . methylPREDNISolone sodium succinate (SOLU-MEDROL) 1,000 mg in sodium chloride 0.9 % 100 mL IVPB  1,000 mg Intravenous Continuous Perkins Molina A, MD   1,000 mg at 07/11/14 1335  . methylPREDNISolone sodium succinate (SOLU-MEDROL) 1,000 mg in sodium chloride 0.9 % 100 mL IVPB  1,000 mg Intravenous Continuous Asa Lente, MD   Stopped at 07/12/14 1530    PAST MEDICAL HISTORY: Past Medical History:  Diagnosis Date  . Headache   . Vision  abnormalities     PAST SURGICAL HISTORY: Past Surgical History:  Procedure Laterality Date  . CESAREAN SECTION  2012  . CHOLECYSTECTOMY, LAPAROSCOPIC  2004  . VAGINAL HYSTERECTOMY      FAMILY HISTORY: Family History  Problem Relation Age of Onset  . Diabetes type II Mother   . Hypertension Mother   . Fibromyalgia Mother   . Cardiomyopathy Father   . Heart attack Father     SOCIAL HISTORY:  Social History   Social History  . Marital status: Married    Spouse name: N/A  . Number of children: N/A  . Years of education: N/A   Occupational History  .  Not on file.   Social History Main Topics  . Smoking status: Current Every Day Smoker    Packs/day: 1.00    Types: Cigarettes  . Smokeless tobacco: Never Used  . Alcohol use No  . Drug use: No  . Sexual activity: Not on file   Other Topics Concern  . Not on file   Social History Narrative  . No narrative on file     PHYSICAL EXAM  Vitals:   12/01/16 0855  BP: (!) 146/97  Pulse: 84  Resp: 18  Weight: 246 lb (111.6 kg)  Height: 5\' 8"  (1.727 m)    Body mass index is 37.4 kg/m.   General: The patient is well-developed and well-nourished and in no acute distress  Neurologic Exam  Mental status: The patient is alert and oriented x 3 at the time of the examination.  Normal memory and attention.     Cranial nerves: EOM intact.  No ptosis.  Decreased color vision right.   Right APD.      Speech is normal.    Normal facial strength and slightly decreased left sensation.   Palatal elevation and tongue protrusion are normal.   SCM/trapezius are strong.   Hearing is symmetric         Motor:  Muscle bulk and tone are normal. Strength is  5 / 5 in the arms.  There is borderline weakness in the left ankle.   Coordination:   Good RAM and FTN in arms.     Slightly reduced left Heel to shin  Sensory: On sensory testing she reports decreased sensation to touch and vibration below the knee on the left.  Symmetric in  arms       Gait and station: Today, she is sitting in a wheelchair and took a while to stand up. She is using a cane.   l.    Gait is wide and off balanced. She is dragging her left foot.  Marland Kitchen    DIAGNOSTIC DATA (LABS, IMAGING, TESTING) - I reviewed patient records, labs, notes, testing and imaging myself where available.     ASSESSMENT AND PLAN  Multiple sclerosis (HCC)  Adjustment disorder with mixed anxiety and depressed mood  Gait disturbance  Neck pain  Pseudoseizure   1.    For the MS, she will continue Tysabri 300 mg every 4 weeks IV.   She has a low lesion burden with just a few T2/FLAIR hyperintense foci in the brain and none noted in the spine, despite the symptoms.    For this reason, we will check an MRI of the cervical spine to determine if there has been interval development of a plaque or any other myelopathy. 2.    She is seeing psychiatry for depression and her history of pseudoseizures.   She is on lamotrigine for a mood stabilizer and Topamax for migraines.  3.  Continue Adderall for fatigue, clonazepam for sleep/anxiety 4.   Change ibuprofen to etodolac 400 mg by mouth twice a day to have fewer pills  Return in 4 months or sooner if there are new or worsening neurologic symptoms.     Damita Eppard A. Epimenio Foot, MD, PhD 12/01/2016, 9:34 AM Certified in Neurology, Clinical Neurophysiology, Sleep Medicine, Pain Medicine and Neuroimaging  [facility] [address], [address] [place], [place] [address] [phone]

## 2016-12-03 ENCOUNTER — Other Ambulatory Visit: Payer: Self-pay | Admitting: Neurology

## 2016-12-07 ENCOUNTER — Other Ambulatory Visit: Payer: Self-pay | Admitting: Neurology

## 2016-12-10 ENCOUNTER — Other Ambulatory Visit: Payer: Self-pay | Admitting: Neurology

## 2016-12-16 ENCOUNTER — Other Ambulatory Visit: Payer: Self-pay | Admitting: Neurology

## 2017-01-12 ENCOUNTER — Telehealth: Payer: Self-pay | Admitting: *Deleted

## 2017-01-12 MED ORDER — AMPHETAMINE-DEXTROAMPHETAMINE 20 MG PO TABS: 20.0000 mg | ORAL_TABLET | Freq: Two times a day (BID) | ORAL | 0 refills | Status: DC

## 2017-01-12 MED ORDER — CLONAZEPAM 1 MG PO TABS: 1.0000 mg | ORAL_TABLET | Freq: Two times a day (BID) | ORAL | 0 refills | Status: DC

## 2017-01-12 NOTE — Addendum Note (Signed)
Addended by: Candis Schatz I on: 01/12/2017 10:01 AM   Modules accepted: Orders

## 2017-01-12 NOTE — Telephone Encounter (Signed)
Clonazepam rx. faxed to [facility] per pt's request/fim

## 2017-01-12 NOTE — Telephone Encounter (Signed)
Adderall rx. provided during infusion appt/fim 

## 2017-02-09 ENCOUNTER — Other Ambulatory Visit: Payer: Self-pay | Admitting: Neurology

## 2017-02-09 ENCOUNTER — Telehealth: Payer: Self-pay | Admitting: Neurology

## 2017-02-09 MED ORDER — AMPHETAMINE-DEXTROAMPHETAMINE 20 MG PO TABS: 20.0000 mg | ORAL_TABLET | Freq: Two times a day (BID) | ORAL | 0 refills | Status: DC

## 2017-02-09 MED ORDER — FLUOXETINE HCL 20 MG PO CAPS: 20.0000 mg | ORAL_CAPSULE | Freq: Every day | ORAL | 11 refills | Status: DC

## 2017-02-09 NOTE — Telephone Encounter (Signed)
Matt with [facility] called office in reference to recent RX for FLUoxetine (PROZAC) 20 MG capsule that was sent in.  He advised patient is also on Cymbalta  I spoke with nurse Faith and she advised I told Matt to not fill the medication.  Pharmacy is going to contact patient directly and advise her to contact our office.  FYI

## 2017-02-09 NOTE — Telephone Encounter (Signed)
Spoke with Matt and explained RAS stopped Cymbalta, started Prozac today/fim

## 2017-02-11 ENCOUNTER — Other Ambulatory Visit: Payer: Self-pay | Admitting: Neurology

## 2017-02-12 NOTE — Telephone Encounter (Signed)
Last filled on 01/12/17.

## 2017-03-09 ENCOUNTER — Other Ambulatory Visit: Payer: Self-pay | Admitting: Neurology

## 2017-03-09 ENCOUNTER — Other Ambulatory Visit: Payer: Self-pay

## 2017-03-09 ENCOUNTER — Telehealth: Payer: Self-pay | Admitting: *Deleted

## 2017-03-09 DIAGNOSIS — Z79899 Other long term (current) drug therapy: Secondary | ICD-10-CM

## 2017-03-09 DIAGNOSIS — G35 Multiple sclerosis: Secondary | ICD-10-CM

## 2017-03-09 MED ORDER — AMPHETAMINE-DEXTROAMPHETAMINE 20 MG PO TABS: 20.0000 mg | ORAL_TABLET | Freq: Two times a day (BID) | ORAL | 0 refills | Status: DC

## 2017-03-09 NOTE — Telephone Encounter (Signed)
Adderall rx. provided during infusion appt.  JCV ab and CBC with diff ordered/fim

## 2017-03-10 LAB — CBC WITH DIFFERENTIAL/PLATELET
BASOS ABS: 0 10*3/uL (ref 0.0–0.2)
Basos: 0 %
EOS (ABSOLUTE): 0.2 10*3/uL (ref 0.0–0.4)
Eos: 2 %
Hematocrit: 39.6 % (ref 34.0–46.6)
Hemoglobin: 13.4 g/dL (ref 11.1–15.9)
IMMATURE GRANS (ABS): 0 10*3/uL (ref 0.0–0.1)
IMMATURE GRANULOCYTES: 1 %
LYMPHS: 45 %
Lymphocytes Absolute: 3.8 10*3/uL — ABNORMAL HIGH (ref 0.7–3.1)
MCH: 30.1 pg (ref 26.6–33.0)
MCHC: 33.8 g/dL (ref 31.5–35.7)
MCV: 89 fL (ref 79–97)
MONOS ABS: 0.5 10*3/uL (ref 0.1–0.9)
Monocytes: 6 %
Neutrophils Absolute: 3.9 10*3/uL (ref 1.4–7.0)
Neutrophils: 46 %
PLATELETS: 175 10*3/uL (ref 150–379)
RBC: 4.45 x10E6/uL (ref 3.77–5.28)
RDW: 14.3 % (ref 12.3–15.4)
WBC: 8.5 10*3/uL (ref 3.4–10.8)

## 2017-03-15 ENCOUNTER — Encounter: Payer: Self-pay | Admitting: *Deleted

## 2017-04-08 ENCOUNTER — Ambulatory Visit (INDEPENDENT_AMBULATORY_CARE_PROVIDER_SITE_OTHER): Payer: BLUE CROSS/BLUE SHIELD | Admitting: Neurology

## 2017-04-08 ENCOUNTER — Encounter: Payer: Self-pay | Admitting: Neurology

## 2017-04-08 VITALS — BP 122/72 | HR 80 | Ht 68.0 in | Wt 237.5 lb

## 2017-04-08 DIAGNOSIS — G35 Multiple sclerosis: Secondary | ICD-10-CM | POA: Diagnosis not present

## 2017-04-08 DIAGNOSIS — R269 Unspecified abnormalities of gait and mobility: Secondary | ICD-10-CM | POA: Diagnosis not present

## 2017-04-08 DIAGNOSIS — G2581 Restless legs syndrome: Secondary | ICD-10-CM | POA: Diagnosis not present

## 2017-04-08 DIAGNOSIS — H469 Unspecified optic neuritis: Secondary | ICD-10-CM | POA: Diagnosis not present

## 2017-04-08 DIAGNOSIS — R350 Frequency of micturition: Secondary | ICD-10-CM | POA: Diagnosis not present

## 2017-04-08 DIAGNOSIS — R5383 Other fatigue: Secondary | ICD-10-CM

## 2017-04-08 DIAGNOSIS — G4489 Other headache syndrome: Secondary | ICD-10-CM | POA: Diagnosis not present

## 2017-04-08 MED ORDER — AMPHETAMINE-DEXTROAMPHETAMINE 20 MG PO TABS: 20.0000 mg | ORAL_TABLET | Freq: Two times a day (BID) | ORAL | 0 refills | Status: DC

## 2017-04-08 MED ORDER — TOPIRAMATE 100 MG PO TABS: 50.0000 mg | ORAL_TABLET | Freq: Every day | ORAL | 11 refills | Status: DC

## 2017-04-08 MED ORDER — CLONAZEPAM 1 MG PO TABS: 1.0000 mg | ORAL_TABLET | Freq: Two times a day (BID) | ORAL | 0 refills | Status: DC

## 2017-04-08 NOTE — Progress Notes (Signed)
[facility] PATIENT: Caitlin [name] DOB: 04-14-1986  REFERRING CLINICIAN: Fredderick Severance  HISTORY FROM: patient REASON FOR VISIT: MS   HISTORICAL  CHIEF COMPLAINT:  Chief Complaint  Patient presents with  . Follow-up    Patient reports that her migraines are coming back.     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      Update 04/08/2017:   Her MS has been stable on Tysabri.   She denies any change in gait, strength or sensation.   Balance improved with vestibular therapy.   She wears a left AFO brace for longer distances.    Bladder seems stable with some urgency.  She takes Flomax if any hesitancy (rare now) but has had a few UTI's.  Vision is fine.    She still has had a few spells with the shaking spells (last one 3 weeks ago and prior one three weeks earlier).    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      Balance is better with Rehab at the [facility].    Migraines have been much better on Topamax.   However, she has had more the past month.    If one occurs she takes Relpax.   About 3-4 weeks ago, a daily right sided occipital headache has occurred.  She wakes up with this headache.   It has intensified into a migraine every couple days the past 2-3 weeks  From 12/01/2016:  MS:   She is on Tysabri and tolerates it well. JCV antibodies 09/27/2016 were indeterminate (0.2 to 0.4). She reports weakness in the left leg. Despite a lot of symptoms, she has a very low plaque burden on the brain MRI and no lesions have been seen in her spine.   MS was diagnosed after an episode of optic neuritis and having a couple small non-specific foci on brain MRI.    A couple years earlier she had a possible transverse myelitis (vs mild GBS).        MRI of the cervical spine 03/02/2016 was essentially normal. There was no evidence of cervical myelopathy and just disc bulge at 2  levels.  The MRI of the brain 06/01/2016 is unchanged compared to the MRI of the brain 01/31/2016 and showed no acute findings.     Pain:  She reports being in a lot more pain and is sitting in a wheelchair today.   Her worse pain is in her hands and legs and neck into her shoulders.   She does not feel she is getting any benefit from her medications.   She had lumbar surgery at L5S1 April 2017.     PseudoseizuresShe went to the [facility] ED 09/09/16 and was felt to be having pseudoseizures and was d/c home.   Earlier in the year, she was hospitalized and had EEG during one of the spells c/w pseudoseizures.    Of note, she is on lamotrigine and Topamax for pain and psych issues.     Mood:  She has anxiety and depression. She has pseudoseizures.   She is on Cymbalta (10 mg at bedtime) without much benefit.   She was seen once by psychiatry in the hospital.   She sees Behavioral Health at [facility] every 2-3 weeks.   .     Gait/strength/numbness:  She reports poor balance and drags the left foot. She reports falling several times.  She notes weakness in the left foot and she feels there ia also mild weakness of the left hand.     She is doing therapy twice weekly  Bladder/bowel: She feels bowel and bladder are doing well.  Vision:   She had right optic neuritis.   Acuity improved but bright lights bother that eye.   Colors are mildly desaturated out of right eye.    She notes reduced right peripheral vision.      Fatigue/sleep:  She reports a lot of fatigue despite 20 mg x 2 Adderall daily.     She falls asleep well with clonazepam 1 mg and trazodone 100 mg but still has sleep maintenance issues.      REVIEW OF SYSTEMS:  Constitutional: No fevers, chills, sweats, or change in appetite.   She has fatigue and insomnia Eyes: as above Ear, nose and throat: No hearing loss, ear pain, nasal congestion, sore throat Cardiovascular: No chest pain, palpitations Respiratory:  No shortness of breath at rest or  with exertion.   No wheezes GastrointestinaI: No nausea, vomiting, diarrhea, abdominal pain, fecal incontinence Genitourinary:  She reports hesitancy.     She notes nocturia. Musculoskeletal:  No neck pain, some back pain Integumentary: No rash, pruritus, skin lesions Neurological: as above Psychiatric: Some depression at this time.  Some anxiety Endocrine: No palpitations, diaphoresis, change in appetite, change in weigh or increased thirst  ALLERGIES: Allergies  Allergen Reactions  . Ciprofloxacin Anaphylaxis  . Influenza Vaccines Other (See Comments)    Hx of Guillain Barre    HOME MEDICATIONS: Outpatient Medications Prior to Visit  Medication Sig Dispense Refill  . eletriptan (RELPAX) 40 MG tablet Take 1 tablet (40 mg total) by mouth as needed for migraine or headache. May repeat in 2 hours if headache persists or recurs. 10 tablet 5  . FLUoxetine (PROZAC) 20 MG capsule Take 1 capsule (20 mg total) by mouth daily. 30 capsule 11  . lamoTRIgine (LAMICTAL) 100 MG tablet TAKE 1 TABLET BY MOUTH 2 TIMES A DAY 60 tablet 5  . natalizumab (TYSABRI) 300 MG/15ML injection Inject into the vein.    . traZODone (DESYREL) 100 MG tablet TAKE 1 TABLET BY MOUTH EVERY NIGHT AT BEDTIME 30 tablet 5  . amphetamine-dextroamphetamine (ADDERALL) 20 MG tablet Take 1 tablet (20 mg total) by mouth 2 (two) times daily. 60 tablet 0  . clonazePAM (KLONOPIN) 1 MG tablet TAKE ONE TABLET BY MOUTH TWICE A DAY 60 tablet 0  . topiramate (TOPAMAX) 50 MG tablet TAKE 1 TABLET BY MOUTH EVERY NIGHT AT BEDTIME 30 tablet 11  . etodolac (LODINE) 400 MG tablet Take 1 tablet (400 mg total) by mouth 2 (two) times daily. 60 tablet 5  . gabapentin (NEURONTIN) 100 MG capsule TAKE 3 CAPSULES BY MOUTH 3 TIMES A DAY 270 capsule 0   Facility-Administered Medications Prior to Visit  Medication Dose Route Frequency Provider Last Rate Last Dose  . methylPREDNISolone sodium succinate (SOLU-MEDROL) 1,000 mg in sodium chloride 0.9 % 100 mL  IVPB  1,000 mg Intravenous Continuous Sater, Richard A, MD   1,000 mg at 07/11/14 1335  . methylPREDNISolone sodium succinate (SOLU-MEDROL) 1,000 mg in sodium chloride 0.9 % 100 mL IVPB  1,000 mg Intravenous Continuous Asa Lente, MD   Stopped at 07/12/14 1530    PAST MEDICAL HISTORY: Past Medical History:  Diagnosis Date  . Headache   . Vision abnormalities     PAST SURGICAL HISTORY: Past Surgical History:  Procedure Laterality Date  .  CESAREAN SECTION  2012  . CHOLECYSTECTOMY, LAPAROSCOPIC  2004  . VAGINAL HYSTERECTOMY      FAMILY HISTORY: Family History  Problem Relation Age of Onset  . Diabetes type II Mother   . Hypertension Mother   . Fibromyalgia Mother   . Cardiomyopathy Father   . Heart attack Father     SOCIAL HISTORY:  Social History   Socioeconomic History  . Marital status: Married    Spouse name: Not on file  . Number of children: Not on file  . Years of education: Not on file  . Highest education level: Not on file  Social Needs  . Financial resource strain: Not on file  . Food insecurity - worry: Not on file  . Food insecurity - inability: Not on file  . Transportation needs - medical: Not on file  . Transportation needs - non-medical: Not on file  Occupational History  . Not on file  Tobacco Use  . Smoking status: Current Every Day Smoker    Packs/day: 1.00    Types: Cigarettes  . Smokeless tobacco: Never Used  Substance and Sexual Activity  . Alcohol use: No    Alcohol/week: 0.0 oz  . Drug use: No  . Sexual activity: Not on file  Other Topics Concern  . Not on file  Social History Narrative  . Not on file     PHYSICAL EXAM  Vitals:   04/08/17 0957  BP: 122/72  Pulse: 80  Weight: 237 lb 8 oz (107.7 kg)  Height: 5\' 8"  (1.727 m)    Body mass index is 36.11 kg/m.   General: The patient is well-developed and well-nourished and in no acute distress.  There is right occipital tenderness. Neck range of motion is  normal.  Neurologic Exam  Mental status: The patient is alert and oriented x 3 at the time of the examination.  Normal memory and attention.     Cranial nerves: EOM intact.  No ptosis.  Decreased color vision right.   Right APD.      Speech is normal.    Normal facial strength and slightly decreased left sensation.   Palatal elevation and tongue protrusion are normal.   SCM/trapezius are strong.   Hearing is symmetric         Motor:  Muscle bulk and tone are normal. Strength is  5 / 5 in the arms.  The left ankle and toes are 5 minus/5   Coordination:   Finger-nose-finger is normal. The left heel-to-shin is reduced. Right heel-to-shin is normal.  Sensory: On sensory testing she reports decreased sensation to touch and vibration below the knee on the left.  Symmetric in arms       Gait and station: Station is normal. The gait is mildly wide with a slight left foot drop. Tandem gait is wide. Romberg is negative.  Reflexes: The left leg reflexes are mildly increased compared to the right..  .     DIAGNOSTIC DATA (LABS, IMAGING, TESTING) - I reviewed patient records, labs, notes, testing and imaging myself where available.     ASSESSMENT AND PLAN  Multiple sclerosis (HCC)  Restless leg - Plan: Ferritin  Optic neuritis  Gait disturbance  Urinary frequency  Other fatigue  Other headache syndrome   1.    She is JCV antibody negative and we'll continue on Tysabri. Her lesion burden has remained very low (brain just shows a few foci including one in the right cerebellar hemisphere).  She did have optic  neuritis and a possible history of transverse myelitis 2.    Continue lamotrigine for a mood stabilizer and Topamax for migraines.  I will increase the Topamax to 100 mg nightly.  3.  Continue Adderall for fatigue, clonazepam for sleep/anxiety 4.   Return in 6 months or sooner if there are new or worsening neurologic symptoms.     Richard A. Epimenio FootSater, MD, PhD 04/08/2017, 10:53  AM Certified in Neurology, Clinical Neurophysiology, Sleep Medicine, Pain Medicine and Neuroimaging  [facility] [address], [address] [place], [place] [address] [phone]

## 2017-04-09 ENCOUNTER — Telehealth: Payer: Self-pay | Admitting: *Deleted

## 2017-04-09 LAB — FERRITIN: Ferritin: 282 ng/mL — ABNORMAL HIGH (ref 15–150)

## 2017-04-09 NOTE — Telephone Encounter (Signed)
Spoke with Caitlin [name] and advised that lab work done in our office is fine. She verbalized understanding of same/fim

## 2017-04-09 NOTE — Telephone Encounter (Signed)
-----   Message from Asa Lenteichard A Sater, MD sent at 04/09/2017 11:35 AM EST ----- Please let the patient know that the lab work is fine.

## 2017-04-12 ENCOUNTER — Telehealth: Payer: Self-pay | Admitting: Neurology

## 2017-04-12 MED ORDER — TAMSULOSIN HCL 0.4 MG PO CAPS: 0.4000 mg | ORAL_CAPSULE | Freq: Every day | ORAL | 3 refills | Status: DC

## 2017-04-12 NOTE — Telephone Encounter (Signed)
Pt request refill for flomax sent to [facility]

## 2017-04-12 NOTE — Telephone Encounter (Signed)
Flomax escribed to [facility] as requested/fim

## 2017-05-08 ENCOUNTER — Other Ambulatory Visit: Payer: Self-pay | Admitting: Neurology

## 2017-05-14 ENCOUNTER — Telehealth: Payer: Self-pay | Admitting: Neurology

## 2017-05-14 MED ORDER — AMPHETAMINE-DEXTROAMPHETAMINE 20 MG PO TABS: 20.0000 mg | ORAL_TABLET | Freq: Two times a day (BID) | ORAL | 0 refills | Status: DC

## 2017-05-14 NOTE — Telephone Encounter (Signed)
Rx. awaiting RAS sig/fim 

## 2017-05-14 NOTE — Telephone Encounter (Signed)
Pt request refill for amphetamine-dextroamphetamine (ADDERALL) 20 MG tablet. Pt is requesting to pick up on Monday, she is aware the clinic closes at noon today

## 2017-05-14 NOTE — Telephone Encounter (Signed)
Adderall rx. up front [facility]/fim 

## 2017-05-20 ENCOUNTER — Other Ambulatory Visit: Payer: Self-pay | Admitting: Neurology

## 2017-05-20 ENCOUNTER — Telehealth: Payer: Self-pay | Admitting: Neurology

## 2017-05-20 MED ORDER — TOPIRAMATE 100 MG PO TABS: ORAL_TABLET | ORAL | 3 refills | Status: DC

## 2017-05-20 NOTE — Addendum Note (Signed)
Addended by: Candis Schatz I on: 05/20/2017 04:09 PM   Modules accepted: Orders

## 2017-05-20 NOTE — Telephone Encounter (Signed)
Pt states as a result of her going from 50 to topiramate (TOPAMAX) 100 MG tablet she has run out and is being told she is requesting medication to be filled too soon when she actually is not based on the 100 mg she is to take.  Pt is asking for this to be resolved with pharmacy [facility], [place] - [address] [phone] (Phone) [phone] (Fax)   Please call pt also

## 2017-05-20 NOTE — Telephone Encounter (Signed)
Spoke with Marchelle Folks.  The problem is that Topamax was increased to 100mg  qd, and correct quantity was sent in, but directions still say take 1/2 tab daily.  New rx with correct instructions, 90 day supply escribed to [facility]/fim

## 2017-06-30 ENCOUNTER — Other Ambulatory Visit: Payer: Self-pay | Admitting: Neurology

## 2017-07-01 ENCOUNTER — Telehealth: Payer: Self-pay | Admitting: *Deleted

## 2017-07-01 MED ORDER — AMPHETAMINE-DEXTROAMPHETAMINE 20 MG PO TABS: 20.0000 mg | ORAL_TABLET | Freq: Two times a day (BID) | ORAL | 0 refills | Status: DC

## 2017-07-01 NOTE — Telephone Encounter (Signed)
Adderall rx. provided during infusion appt. today/fim

## 2017-07-07 ENCOUNTER — Telehealth: Payer: Self-pay | Admitting: Neurology

## 2017-07-07 NOTE — Telephone Encounter (Signed)
Spoke with Caitlin [name].  She doesn't think Topamax 100mg  qhs is helping h/a's and would like to discuss other tx. options.  Also sts. Relpax does not help.  Last seen in November 2018.  Unable to come in until 07/21/17 or after.  Appt. given 07/26/17/fim

## 2017-07-07 NOTE — Telephone Encounter (Signed)
Pt called wanting to be seen around 3/13 when she gets enough money for the co-pay. Stating she is having headaches daily and at least 2 migraines a week. Please call to advise

## 2017-07-07 NOTE — Telephone Encounter (Signed)
Per RAS, I have spoken with pt. and advised she increase Topamax to 150mg  qhs, until seen at appt. 3/18.  She verbalized understanding of same, is agreeable/fim

## 2017-07-26 ENCOUNTER — Encounter: Payer: Self-pay | Admitting: Neurology

## 2017-07-26 ENCOUNTER — Ambulatory Visit: Payer: Self-pay | Admitting: Neurology

## 2017-07-29 ENCOUNTER — Other Ambulatory Visit: Payer: Self-pay | Admitting: Neurology

## 2017-07-29 ENCOUNTER — Telehealth: Payer: Self-pay | Admitting: *Deleted

## 2017-07-29 MED ORDER — AMPHETAMINE-DEXTROAMPHETAMINE 20 MG PO TABS: 20.0000 mg | ORAL_TABLET | Freq: Two times a day (BID) | ORAL | 0 refills | Status: DC

## 2017-07-29 NOTE — Telephone Encounter (Signed)
Adderall rx. provided during infusion appt/fim 

## 2017-07-29 NOTE — Telephone Encounter (Signed)
Adderall rx. printed today has been destroyed/fim

## 2017-07-29 NOTE — Telephone Encounter (Signed)
Pt. left prior to receiving written Adderall rx., now would like rx. escribed to [facility]/fim

## 2017-07-29 NOTE — Telephone Encounter (Signed)
Please fax RX to pharmacy to [facility]

## 2017-07-30 ENCOUNTER — Other Ambulatory Visit: Payer: Self-pay | Admitting: Neurology

## 2017-07-30 MED ORDER — AMPHETAMINE-DEXTROAMPHETAMINE 20 MG PO TABS: 20.0000 mg | ORAL_TABLET | Freq: Two times a day (BID) | ORAL | 0 refills | Status: DC

## 2017-08-10 ENCOUNTER — Telehealth: Payer: Self-pay | Admitting: Neurology

## 2017-08-10 MED ORDER — TOPIRAMATE 100 MG PO TABS: ORAL_TABLET | ORAL | 0 refills | Status: DC

## 2017-08-10 NOTE — Telephone Encounter (Signed)
Topamax was increased to 150mg  daily until next ov. (See 2/27 phone note).   Rx. escribed to [facility] as requested/fim

## 2017-08-10 NOTE — Telephone Encounter (Signed)
Pt requesting a refill for topiramate (TOPAMAX) 100 MG tablet stating she takes 1 and a half pills --150MG  sent to [facility]

## 2017-08-25 ENCOUNTER — Other Ambulatory Visit: Payer: Self-pay | Admitting: Neurology

## 2017-09-27 ENCOUNTER — Other Ambulatory Visit: Payer: Self-pay | Admitting: Neurology

## 2017-09-27 NOTE — Telephone Encounter (Signed)
Rx registry checked and her last fill was on 08/30/17 for qty 60. Patient has a follow up OV scheduled for 10/07/17. Pharmacy confirmed.

## 2017-09-29 ENCOUNTER — Other Ambulatory Visit: Payer: Self-pay | Admitting: Neurology

## 2017-10-07 ENCOUNTER — Telehealth: Payer: Self-pay | Admitting: *Deleted

## 2017-10-07 ENCOUNTER — Other Ambulatory Visit: Payer: Self-pay

## 2017-10-07 ENCOUNTER — Ambulatory Visit (INDEPENDENT_AMBULATORY_CARE_PROVIDER_SITE_OTHER): Payer: BLUE CROSS/BLUE SHIELD | Admitting: Neurology

## 2017-10-07 ENCOUNTER — Encounter: Payer: Self-pay | Admitting: Neurology

## 2017-10-07 VITALS — BP 133/80 | HR 93 | Resp 18 | Ht 68.0 in | Wt 240.5 lb

## 2017-10-07 DIAGNOSIS — R350 Frequency of micturition: Secondary | ICD-10-CM

## 2017-10-07 DIAGNOSIS — IMO0002 Reserved for concepts with insufficient information to code with codable children: Secondary | ICD-10-CM | POA: Insufficient documentation

## 2017-10-07 DIAGNOSIS — R5383 Other fatigue: Secondary | ICD-10-CM | POA: Diagnosis not present

## 2017-10-07 DIAGNOSIS — R339 Retention of urine, unspecified: Secondary | ICD-10-CM | POA: Diagnosis not present

## 2017-10-07 DIAGNOSIS — G4700 Insomnia, unspecified: Secondary | ICD-10-CM

## 2017-10-07 DIAGNOSIS — G35 Multiple sclerosis: Secondary | ICD-10-CM | POA: Diagnosis not present

## 2017-10-07 DIAGNOSIS — H469 Unspecified optic neuritis: Secondary | ICD-10-CM | POA: Diagnosis not present

## 2017-10-07 DIAGNOSIS — G43709 Chronic migraine without aura, not intractable, without status migrainosus: Secondary | ICD-10-CM

## 2017-10-07 DIAGNOSIS — Z79899 Other long term (current) drug therapy: Secondary | ICD-10-CM | POA: Diagnosis not present

## 2017-10-07 DIAGNOSIS — R269 Unspecified abnormalities of gait and mobility: Secondary | ICD-10-CM | POA: Diagnosis not present

## 2017-10-07 MED ORDER — ZOLPIDEM TARTRATE 5 MG PO TABS: 5.0000 mg | ORAL_TABLET | Freq: Every evening | ORAL | 3 refills | Status: DC | PRN

## 2017-10-07 MED ORDER — FREMANEZUMAB-VFRM 225 MG/1.5ML ~~LOC~~ SOSY: 1.0000 | PREFILLED_SYRINGE | SUBCUTANEOUS | 4 refills | Status: DC

## 2017-10-07 NOTE — Telephone Encounter (Signed)
PA for Ajovy 225mg /1.19ml syringes #1/30 completed via [employer]. Dx. Chronic Migraine (G43.709). Tried and failed meds: Topamax, Lamictal, Flexeril, Clonazepam, Diazepam, Cymbalta, Relpax, Lexapro, Etodolac, Gabapentin, Indocin, Methylprednisolone.  PA approved by [facility] for dates 10/07/17 thru 01/05/18. Ref# [id number]

## 2017-10-07 NOTE — Progress Notes (Signed)
[facility] PATIENT: Caitlin Caitlin [name] DOB: 03-30-1986  REFERRING CLINICIAN: Fredderick Caitlin [name]  HISTORY FROM: patient REASON FOR VISIT: MS   HISTORICAL  CHIEF COMPLAINT:  Chief Complaint  Patient presents with  . Multiple Sclerosis    Sts. she continues to tolerate Tysabri well.  JCV ab last checked 03/09/17 and was negative at 0.13.  Sts. she is having more trouble going to and staying asleep, despite Clonazepam.  Sts. Trazodone didn't help, so she stopped it.  Sts. migraines are same severity, same frequency, despite compliance with Topamax.  Sts. 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      Update 10/07/2017: She feels at the MS has been mostly stable.  She is on Tysabri and she tolerates it well.  Her last JCV antibody checked 7 months ago was negative at 0.13.  There have not been any exacerbations.  She does not note any changes in her gait, strength or sensation.  She continues to have some difficulty with balance and no longer uses Caitlin [name] cane.  No recent falls.   She has tringling in her left hand and arm ut gabapentin had not helped.     She has some urinary frequency and urgency but no incontinence.  She has had some hesitancy in the past and was on Flomax helps her empty betterr.  She has had one recent urinary tract infection.  Vision is doing well.  She reports difficulty with insomnia despite taking clonazepam at night.  She tried trazodone in addition but that did not help.  She has headaches daily (30/30) and migraines > 15 days/month for > 4 hours.   Many wake her up in the early morning.   The are usually on her right.  Often she has right neck pain.    These occur despite being on Topamax as Caitlin [name] prophylactic agent.  She takes Relpax but the benefit was minimal so she stopped    Update 04/08/2017:   Her MS has been stable on Tysabri.   She denies any change  in gait, strength or sensation.   Balance improved with vestibular therapy.   She wears Caitlin [name] left AFO brace for longer distances.    Bladder seems stable with some urgency.  She takes Flomax if any hesitancy (rare now) but has had Caitlin [name] few UTI's.  Vision is fine.    She still has had Caitlin [name] few spells with the shaking spells (last one 3 weeks ago and prior one three weeks earlier).    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      Balance is better with Rehab at the [facility].    Migraines have been much better on Topamax.   However, she has had more the past month.    If one occurs she takes Relpax.   About 3-4 weeks ago, Caitlin [name] daily right sided occipital headache has occurred.  She wakes up with this headache.   It has intensified into Caitlin [name] migraine every couple days the past 2-3 weeks  From 12/01/2016:  MS:   She is on Tysabri and tolerates it well. JCV antibodies 09/27/2016 were indeterminate (0.2 to 0.4). She reports weakness in the left leg. Despite Caitlin [name] lot of symptoms, she has Caitlin [name] very low plaque burden on the brain MRI and no lesions have been seen in her spine.   MS was  diagnosed after an episode of optic neuritis and having Caitlin [name] couple small non-specific foci on brain MRI.    Caitlin [name] couple years earlier she had Caitlin [name] possible transverse myelitis (vs mild GBS).        MRI of the cervical spine 03/02/2016 was essentially normal. There was no evidence of cervical myelopathy and just disc bulge at 2 levels.  The MRI of the brain 06/01/2016 is unchanged compared to the MRI of the brain 01/31/2016 and showed no acute findings.     Pain:  She reports being in Caitlin [name] lot more pain and is sitting in Caitlin [name] wheelchair today.   Her worse pain is in her hands and legs and neck into her shoulders.   She does not feel she is getting any benefit from her medications.   She had lumbar surgery at L5S1 April 2017.     PseudoseizuresShe went to the [facility] ED 09/09/16 and was felt to be having pseudoseizures and was d/c  home.   Earlier in the year, she was hospitalized and had EEG during one of the spells c/w pseudoseizures.    Of note, she is on lamotrigine and Topamax for pain and psych issues.     Mood:  She has anxiety and depression. She has pseudoseizures.   She is on Cymbalta (10 mg at bedtime) without much benefit.   She was seen once by psychiatry in the hospital.   She sees Behavioral Health at [facility] every 2-3 weeks.   .     Gait/strength/numbness:  She reports poor balance and drags the left Caitlin [name]. She reports falling several times.    She notes weakness in the left Caitlin [name] and she feels there ia also mild weakness of the left hand.     She is doing therapy twice weekly  Bladder/bowel: She feels bowel and bladder are doing well.  Vision:   She had right optic neuritis.   Acuity improved but bright lights bother that eye.   Colors are mildly desaturated out of right eye.    She notes reduced right peripheral vision.      Fatigue/sleep:  She reports Caitlin [name] lot of fatigue despite 20 mg x 2 Adderall daily.     She falls asleep well with clonazepam 1 mg and trazodone 100 mg but still has sleep maintenance issues.      REVIEW OF SYSTEMS:  Constitutional: No fevers, chills, sweats, or change in appetite.   She has fatigue and insomnia Eyes: as above Ear, nose and throat: No hearing loss, ear pain, nasal congestion, sore throat Cardiovascular: No chest pain, palpitations Respiratory:  No shortness of breath at rest or with exertion.   No wheezes GastrointestinaI: No nausea, vomiting, diarrhea, abdominal pain, fecal incontinence Genitourinary:  She reports hesitancy.     She notes nocturia. Musculoskeletal:  No neck pain, some back pain Integumentary: No rash, pruritus, skin lesions Neurological: as above Psychiatric: Some depression at this time.  Some anxiety Endocrine: No palpitations, diaphoresis, change in appetite, change in weigh or increased thirst  ALLERGIES: Allergies  Allergen Reactions  .  Ciprofloxacin Anaphylaxis  . Influenza Vaccines Other (See Comments)    Hx of Guillain Barre    HOME MEDICATIONS: Outpatient Medications Prior to Visit  Medication Sig Dispense Refill  . amphetamine-dextroamphetamine (ADDERALL) 20 MG tablet TAKE 1 TABLET BY MOUTH 2 TIMES Caitlin [name] DAY 60 tablet 0  . FLUoxetine (PROZAC) 20 MG capsule Take 1 capsule (20 mg total) by mouth daily. 30 capsule 11  .  lamoTRIgine (LAMICTAL) 100 MG tablet TAKE 1 TABLET BY MOUTH 2 TIMES Caitlin [name] DAY 60 tablet 5  . natalizumab (TYSABRI) 300 MG/15ML injection Inject into the vein.    . tamsulosin (FLOMAX) 0.4 MG CAPS capsule Take 1 capsule (0.4 mg total) by mouth daily. 90 capsule 3  . topiramate (TOPAMAX) 100 MG tablet Take one and one-half tablets daily 135 tablet 0  . clonazePAM (KLONOPIN) 1 MG tablet TAKE 1 TABLET BY MOUTH 2 TIMES Caitlin [name] DAY 60 tablet 0  . eletriptan (RELPAX) 40 MG tablet Take 1 tablet (40 mg total) by mouth as needed for migraine or headache. May repeat in 2 hours if headache persists or recurs. 10 tablet 5  . traZODone (DESYREL) 100 MG tablet TAKE 1 TABLET BY MOUTH EVERY NIGHT AT BEDTIME (Patient not taking: Reported on 10/07/2017) 30 tablet 5   Facility-Administered Medications Prior to Visit  Medication Dose Route Frequency Provider Last Rate Last Dose  . methylPREDNISolone sodium succinate (SOLU-MEDROL) 1,000 mg in sodium chloride 0.9 % 100 mL IVPB  1,000 mg Intravenous Continuous Sharea Guinther A, MD   1,000 mg at 07/11/14 1335  . methylPREDNISolone sodium succinate (SOLU-MEDROL) 1,000 mg in sodium chloride 0.9 % 100 mL IVPB  1,000 mg Intravenous Continuous Asa Lente, MD   Stopped at 07/12/14 1530    PAST MEDICAL HISTORY: Past Medical History:  Diagnosis Date  . Headache   . Vision abnormalities     PAST SURGICAL HISTORY: Past Surgical History:  Procedure Laterality Date  . CESAREAN SECTION  2012  . CHOLECYSTECTOMY, LAPAROSCOPIC  2004  . VAGINAL HYSTERECTOMY      FAMILY HISTORY: Family  History  Problem Relation Age of Onset  . Diabetes type II Mother   . Hypertension Mother   . Fibromyalgia Mother   . Cardiomyopathy Father   . Heart attack Father     SOCIAL HISTORY:  Social History   Socioeconomic History  . Marital status: Married    Spouse name: Not on file  . Number of children: Not on file  . Years of education: Not on file  . Highest education level: Not on file  Occupational History  . Not on file  Social Needs  . Financial resource strain: Not on file  . Food insecurity:    Worry: Not on file    Inability: Not on file  . Transportation needs:    Medical: Not on file    Non-medical: Not on file  Tobacco Use  . Smoking status: Former Smoker    Packs/day: 1.00    Types: Cigarettes  . Smokeless tobacco: Never Used  Substance and Sexual Activity  . Alcohol use: No    Alcohol/week: 0.0 oz  . Drug use: No  . Sexual activity: Not on file  Lifestyle  . Physical activity:    Days per week: Not on file    Minutes per session: Not on file  . Stress: Not on file  Relationships  . Social connections:    Talks on phone: Not on file    Gets together: Not on file    Attends religious service: Not on file    Active member of club or organization: Not on file    Attends meetings of clubs or organizations: Not on file    Relationship status: Not on file  . Intimate partner violence:    Fear of current or ex partner: Not on file    Emotionally abused: Not on file    Physically abused: Not  on file    Forced sexual activity: Not on file  Other Topics Concern  . Not on file  Social History Narrative  . Not on file     PHYSICAL EXAM  Vitals:   10/07/17 0954  BP: 133/80  Pulse: 93  Resp: 18  Weight: 240 lb 8 oz (109.1 kg)  Height: 5\' 8"  (1.727 m)    Body mass index is 36.57 kg/m.   General: The patient is well-developed and well-nourished and in no acute distress.  There is right occipital tenderness. Neck range of motion is  normal.  Neurologic Exam  Mental status: The patient is alert and oriented x 3 at the time of the examination.  Normal memory and attention.     Cranial nerves: EOM intact.  No ptosis.  Decreased color vision right.   She has Caitlin [name] right APD.      Speech is normal.    Normal facial strength and slightly decreased left sensation.   Palatal elevation and tongue protrusion are normal.   SCM/trapezius are strong.   Hearing is symmetric         Motor:  Muscle bulk and tone are normal. Strength is  5 / 5 in the arms.  The left ankle and toes are 5 minus/5   Coordination:   Finger-nose-finger is normal bilaterally. The left heel-to-shin is reduced. Right heel-to-shin is normal.  Sensory: On sensory testing she reports decreased sensation to touch and vibration below the knee on the left.  Symmetric in arms       Gait and station: Station is normal. The gait is mildly wide and favors the left Caitlin [name] and hte tandem gait is wide.  She does not need Caitlin [name] cane. . Romberg is negative.  Reflexes: The left leg reflexes are mildly increased compared to the right..  .     DIAGNOSTIC DATA (LABS, IMAGING, TESTING) - I reviewed patient records, labs, notes, testing and imaging myself where available.     ASSESSMENT AND PLAN  Multiple sclerosis (HCC) - Plan: Stratify JCV Antibody Test ([facility]), CBC with Differential/Platelet  Optic neuritis  Urinary frequency  Gait disturbance  Incomplete bladder emptying  Other fatigue  Chronic migraine  High risk medication use - Plan: Stratify JCV Antibody Test ([facility]), CBC with Differential/Platelet   1.    She is JCV antibody negative and we'll continue on Tysabri. Her lesion burden has remained low but she had optic neuritis and Caitlin [name] possible history of transverse myelitis 2.   Continue lamotrigine for Caitlin [name] mood stabilizer and Topamax for migraines.    Add Ajovy for chronic migraines occurring > 15 days/month and not helped by several prophylactic agents (topiramate,  lamotrigine, NSAIDs, antidepressants) 3.   Continue Adderall for fatigue, clonazepam for sleep/anxiety 4.   Change clonazepam to ambien for insomnia 5.   Return in 6 months or sooner if there are new or worsening neurologic symptoms.     Caitlin Naves Caitlin [name]. Epimenio Foot, MD, PhD 10/07/2017, 10:25 AM Certified in Neurology, Clinical Neurophysiology, Sleep Medicine, Pain Medicine and Neuroimaging  [facility] [address] [place], [place] [address] [phone]

## 2017-10-08 LAB — CBC WITH DIFFERENTIAL/PLATELET
BASOS: 0 %
Basophils Absolute: 0 10*3/uL (ref 0.0–0.2)
EOS (ABSOLUTE): 0.2 10*3/uL (ref 0.0–0.4)
EOS: 2 %
HEMATOCRIT: 42.2 % (ref 34.0–46.6)
HEMOGLOBIN: 14.2 g/dL (ref 11.1–15.9)
IMMATURE GRANS (ABS): 0 10*3/uL (ref 0.0–0.1)
IMMATURE GRANULOCYTES: 0 %
LYMPHS: 47 %
Lymphocytes Absolute: 4.5 10*3/uL — ABNORMAL HIGH (ref 0.7–3.1)
MCH: 30.5 pg (ref 26.6–33.0)
MCHC: 33.6 g/dL (ref 31.5–35.7)
MCV: 91 fL (ref 79–97)
Monocytes Absolute: 0.7 10*3/uL (ref 0.1–0.9)
Monocytes: 7 %
NEUTROS ABS: 4.2 10*3/uL (ref 1.4–7.0)
NEUTROS PCT: 44 %
Platelets: 238 10*3/uL (ref 150–450)
RBC: 4.66 x10E6/uL (ref 3.77–5.28)
RDW: 14.9 % (ref 12.3–15.4)
WBC: 9.5 10*3/uL (ref 3.4–10.8)

## 2017-10-13 ENCOUNTER — Encounter: Payer: Self-pay | Admitting: *Deleted

## 2017-10-21 ENCOUNTER — Telehealth: Payer: Self-pay | Admitting: *Deleted

## 2017-10-21 MED ORDER — ZOLPIDEM TARTRATE 10 MG PO TABS: 10.0000 mg | ORAL_TABLET | Freq: Every evening | ORAL | 0 refills | Status: DC | PRN

## 2017-10-21 NOTE — Telephone Encounter (Signed)
Pt. in for Ty infusion, sts. Ambien 5mg  helping her stay asleep, but she is still having trouble going to sleep. Per RAS, ok to increase Ambien to 10mg  po qhs. Rx. faxed to [facility] per pt's request/fim

## 2017-10-27 ENCOUNTER — Other Ambulatory Visit: Payer: Self-pay | Admitting: Neurology

## 2017-11-17 ENCOUNTER — Other Ambulatory Visit: Payer: Self-pay | Admitting: Neurology

## 2017-11-19 ENCOUNTER — Other Ambulatory Visit: Payer: Self-pay | Admitting: Neurology

## 2017-11-26 ENCOUNTER — Other Ambulatory Visit: Payer: Self-pay | Admitting: Neurology

## 2017-12-27 ENCOUNTER — Other Ambulatory Visit: Payer: Self-pay | Admitting: Neurology

## 2017-12-28 ENCOUNTER — Telehealth: Payer: Self-pay | Admitting: Neurology

## 2017-12-28 NOTE — Telephone Encounter (Signed)
Per [employer], they do not handle pharmacy claims/pa's for pt's insurance plan ([employer]).  I called [employer] and was told by Gigi Gin that Ajovy does not need a PA. I have spoken with Jillyn Hidden at [facility] and explained I don't know where to go from here--[employer] is the pharmacy benefit manager listed on pt's [employer] ins. card but they say they don't handle claims for this plan, and insurance sts. no pa needed.  I have spoken with pt. and explained she may be able to address this thru the HR dept. of her husband's employer--she should verify who their benefits manager is and what # her doctor's office needs to call for pa's for medications.  She verbalized understanding of same/fim

## 2017-12-28 NOTE — Telephone Encounter (Signed)
Pt states that she missed a call from RN Faith and is asking for a call back.  Please call

## 2017-12-28 NOTE — Telephone Encounter (Signed)
Pt has called for RN Faith stating she has been told a copy of her new insurance card is needed re: her Ajovy.  Pt states instead of her coming here to bring another copy of her card.  Pt is asking that RN Faith checks with the infusion suite, they have a copy of her new card.  Pt asking for a call if that cant be done

## 2017-12-28 NOTE — Telephone Encounter (Signed)
Spoke with Marchelle Folks.  She sts. she has spoken with [employer], and Ajovy was approved for the next 3 mos; that [facility] is aware.  [employer] will send paperwork to our office to complete to cover continuation of Ajovy/fim

## 2018-01-27 ENCOUNTER — Other Ambulatory Visit: Payer: Self-pay | Admitting: Neurology

## 2018-02-16 ENCOUNTER — Telehealth: Payer: Self-pay | Admitting: *Deleted

## 2018-02-16 NOTE — Telephone Encounter (Signed)
Clarification for Ajovy PA provided to [facility]. Case ID: [id number]

## 2018-02-21 NOTE — Telephone Encounter (Signed)
Fax received from [employer], phone# [phone]. Ajovy approved for dates 02/08/18 thru 02/18/19. Member ID: [id number].  Case: [id number]

## 2018-02-28 ENCOUNTER — Other Ambulatory Visit: Payer: Self-pay | Admitting: Neurology

## 2018-03-29 ENCOUNTER — Other Ambulatory Visit: Payer: Self-pay | Admitting: Neurology

## 2018-04-13 ENCOUNTER — Telehealth: Payer: Self-pay | Admitting: Neurology

## 2018-04-13 NOTE — Telephone Encounter (Signed)
At 10:26 pt husband called stating that he has left messages for Rosey Bath in the infusion suite and has not heard back from her.  He states that he is getting very upset over this because his wife is at home not feeling well.  Husband stated that he wants a call from Louise @ [phone].  He was informed that because he is not on DPR  A call would likely be made directly to patient.  Husband stated that Rosey Bath had better call his wife by 11:30 or he would be coming here(from out of town) and it would not be pretty.  Husband states that if he has to come here he will have to be escorted out because it will not be pretty.  Husband asking that pt be called [phone]

## 2018-04-13 NOTE — Telephone Encounter (Signed)
I called the patient. Advised that I would give the message to the intrafusion manager and she would be giving him a call.

## 2018-04-18 NOTE — Telephone Encounter (Signed)
Error

## 2018-04-20 ENCOUNTER — Ambulatory Visit (INDEPENDENT_AMBULATORY_CARE_PROVIDER_SITE_OTHER): Payer: Medicare Other | Admitting: Neurology

## 2018-04-20 ENCOUNTER — Encounter: Payer: Self-pay | Admitting: Neurology

## 2018-04-20 ENCOUNTER — Telehealth: Payer: Self-pay | Admitting: *Deleted

## 2018-04-20 ENCOUNTER — Other Ambulatory Visit: Payer: Self-pay

## 2018-04-20 VITALS — BP 125/80 | HR 70 | Resp 18 | Ht 68.0 in | Wt 256.5 lb

## 2018-04-20 DIAGNOSIS — F445 Conversion disorder with seizures or convulsions: Secondary | ICD-10-CM

## 2018-04-20 DIAGNOSIS — IMO0002 Reserved for concepts with insufficient information to code with codable children: Secondary | ICD-10-CM

## 2018-04-20 DIAGNOSIS — H469 Unspecified optic neuritis: Secondary | ICD-10-CM

## 2018-04-20 DIAGNOSIS — G43709 Chronic migraine without aura, not intractable, without status migrainosus: Secondary | ICD-10-CM | POA: Diagnosis not present

## 2018-04-20 DIAGNOSIS — R339 Retention of urine, unspecified: Secondary | ICD-10-CM

## 2018-04-20 DIAGNOSIS — G35 Multiple sclerosis: Secondary | ICD-10-CM

## 2018-04-20 DIAGNOSIS — R269 Unspecified abnormalities of gait and mobility: Secondary | ICD-10-CM

## 2018-04-20 DIAGNOSIS — R5383 Other fatigue: Secondary | ICD-10-CM

## 2018-04-20 DIAGNOSIS — G35D Multiple sclerosis, unspecified: Secondary | ICD-10-CM

## 2018-04-20 MED ORDER — TOPIRAMATE 100 MG PO TABS: ORAL_TABLET | ORAL | 3 refills | Status: DC

## 2018-04-20 MED ORDER — TAMSULOSIN HCL 0.4 MG PO CAPS: 0.4000 mg | ORAL_CAPSULE | Freq: Every day | ORAL | 3 refills | Status: DC

## 2018-04-20 NOTE — Progress Notes (Signed)
[facility] PATIENT: Caitlin [name] DOB: 20-Oct-1985  REFERRING CLINICIAN: Fredderick Severance  HISTORY FROM: patient REASON FOR VISIT: MS   HISTORICAL  CHIEF COMPLAINT:  Chief Complaint  Patient presents with  . Multiple Sclerosis    Rm. 12.  Sts. she continues to tolerate Tysabri well.  JCV ab last checked 10/07/17 and was negative at 0.17/fim    HISTORY OF PRESENT ILLNESS:  Caitlin [name] is a 32 y.o. 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      Update 04/20/2018: She is on Tysabri as her disease modifying therapy.  She tolerates it well.  JCV antibody 6 - 7 months ago was negative at 0.17.  She has never had a positive JCV antibody.  She denies any exacerbations.   Her gait is doing ok.  She has no recent falls.   She tires out easily while walking.    Strength/sensation are the same.   She has more urinary urgency and has had some incontinence.  She has hesitancy and frequent UTI's.   She does not think she has a UTI.    She stopped tamsulosin.   She did not think it helped much.  She has fatigue in the afternoons and some cognitive fog.   Adderall has helped.     MRI's have shown mild number of small brain lesions but she did have optic neuritis in the past.   She has not had any more spells.  She is off all anticonvulsants now.  Migraines are much better on Ajovy.   She tolerates it well.   She has had only 2 migraines and several regular headaches the last 3 months.  She is on Topamax also.  Her lower back is hurting and radiates down her leg at times.      Update 10/07/2017: She feels at the MS has been mostly stable.  She is on Tysabri and she tolerates it well.  Her last JCV antibody checked 7 months ago was negative at 0.13.  There have not been any exacerbations.  She does not note any changes in her gait, strength or sensation.  She continues to have some difficulty with balance and no longer uses a cane.  No recent falls.   She has tringling in her left  hand and arm ut gabapentin had not helped.     She has some urinary frequency and urgency but no incontinence.  She has had some hesitancy in the past and was on Flomax helps her empty betterr.  She has had one recent urinary tract infection.  Vision is doing well.  She reports difficulty with insomnia despite taking clonazepam at night.  She tried trazodone in addition but that did not help.  She has headaches daily (30/30) and migraines > 15 days/month for > 4 hours.   Many wake her up in the early morning.   The are usually on her right.  Often she has right neck pain.    These occur despite being on Topamax as a prophylactic agent.  She takes Relpax but the benefit was minimal so she stopped    Update 04/08/2017:   Her MS has been stable on Tysabri.   She denies any change in gait, strength or sensation.   Balance improved with vestibular therapy.   She wears a left AFO brace for longer distances.    Bladder seems stable with some urgency.  She takes Flomax if any hesitancy (rare now) but has had a few UTI's.  Vision is fine.    She still has had a few spells with the shaking spells (last one 3 weeks ago and prior one three weeks earlier).    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      Balance is better with Rehab at the [facility].    Migraines have been much better on Topamax.   However, she has had more the past month.    If one occurs she takes Relpax.   About 3-4 weeks ago, a daily right sided occipital headache has occurred.  She wakes up with this headache.   It has intensified into a migraine every couple days the past 2-3 weeks  From 12/01/2016:  MS:   She is on Tysabri and tolerates it well. JCV antibodies 09/27/2016 were indeterminate (0.2 to 0.4). She reports weakness in the left leg. Despite a lot of symptoms, she has a very low plaque burden on the brain MRI and no lesions have been seen in her spine.   MS was diagnosed after an episode of optic  neuritis and having a couple small non-specific foci on brain MRI.    A couple years earlier she had a possible transverse myelitis (vs mild GBS).        MRI of the cervical spine 03/02/2016 was essentially normal. There was no evidence of cervical myelopathy and just disc bulge at 2 levels.  The MRI of the brain 06/01/2016 is unchanged compared to the MRI of the brain 01/31/2016 and showed no acute findings.     Pain:  She reports being in a lot more pain and is sitting in a wheelchair today.   Her worse pain is in her hands and legs and neck into her shoulders.   She does not feel she is getting any benefit from her medications.   She had lumbar surgery at L5S1 April 2017.     PseudoseizuresShe went to [facility]09[facility] and was felt to be having pseudoseizures and was d/c home.   Earlier in the year, she was hospitalized and had EEG during one of the spells c/w pseudoseizures.    Of note, she is on lamotrigine and Topamax for pain and psych issues.     Mood:  She has anxiety and depression. She has pseudoseizures.   She is on Cymbalta (10 mg at bedtime) without much benefit.   She was seen once by psychiatry in the hospital.   She sees Behavioral Health at [facility] every 2-3 weeks.   .     Gait/strength/numbness:  She reports poor balance and drags the left foot. She reports falling several times.    She notes weakness in the left foot and she feels there ia also mild weakness of the left hand.     She is doing therapy twice weekly  Bladder/bowel: She feels bowel and bladder are doing well.  Vision:   She had right optic neuritis.   Acuity improved but bright lights bother that eye.   Colors are mildly desaturated out of right eye.    She notes reduced right peripheral vision.      Fatigue/sleep:  She reports a lot of fatigue despite 20 mg x 2 Adderall daily.     She falls asleep well with clonazepam 1 mg and trazodone 100 mg but still has sleep maintenance issues.      REVIEW OF SYSTEMS:    Constitutional: No fevers, chills, sweats, or change in appetite.  She has fatigue and insomnia Eyes: as above Ear, nose and throat: No hearing loss, ear pain, nasal congestion, sore throat Cardiovascular: No chest pain, palpitations Respiratory:  No shortness of breath at rest or with exertion.   No wheezes GastrointestinaI: No nausea, vomiting, diarrhea, abdominal pain, fecal incontinence Genitourinary:  She reports hesitancy.     She notes nocturia. Musculoskeletal:  No neck pain, some back pain Integumentary: No rash, pruritus, skin lesions Neurological: as above Psychiatric: Some depression at this time.  Some anxiety Endocrine: No palpitations, diaphoresis, change in appetite, change in weigh or increased thirst  ALLERGIES: Allergies  Allergen Reactions  . Ciprofloxacin Anaphylaxis  . Influenza Vaccines Other (See Comments)    Hx of Guillain Barre    HOME MEDICATIONS: Outpatient Medications Prior to Visit  Medication Sig Dispense Refill  . amphetamine-dextroamphetamine (ADDERALL) 20 MG tablet TAKE 1 TABLET BY MOUTH 2 TIMES A DAY 60 tablet 0  . Fremanezumab-vfrm (AJOVY) 225 MG/1.5ML SOSY Inject 1 Syringe into the skin every 28 (twenty-eight) days. 3 Syringe 4  . natalizumab (TYSABRI) 300 MG/15ML injection Inject into the vein.    . tamsulosin (FLOMAX) 0.4 MG CAPS capsule Take 1 capsule (0.4 mg total) by mouth daily. 90 capsule 3  . topiramate (TOPAMAX) 100 MG tablet TAKE 1 & 1/2 TABLETS BY MOUTH DAILY 135 tablet 1  . FLUoxetine (PROZAC) 20 MG capsule Take 1 capsule (20 mg total) by mouth daily. 30 capsule 11  . lamoTRIgine (LAMICTAL) 100 MG tablet TAKE 1 TABLET BY MOUTH 2 TIMES A DAY (Patient not taking: Reported on 04/20/2018) 60 tablet 5  . zolpidem (AMBIEN) 10 MG tablet TAKE 1 TABLET BY MOUTH EVERY NIGHT AT BEDTIME AS NEEDED FOR SLEEP 30 tablet 5   Facility-Administered Medications Prior to Visit  Medication Dose Route Frequency Provider Last Rate Last Dose  .  methylPREDNISolone sodium succinate (SOLU-MEDROL) 1,000 mg in sodium chloride 0.9 % 100 mL IVPB  1,000 mg Intravenous Continuous Jennye Runquist A, MD   1,000 mg at 07/11/14 1335  . methylPREDNISolone sodium succinate (SOLU-MEDROL) 1,000 mg in sodium chloride 0.9 % 100 mL IVPB  1,000 mg Intravenous Continuous Asa Lente, MD   Stopped at 07/12/14 1530    PAST MEDICAL HISTORY: Past Medical History:  Diagnosis Date  . Headache   . Vision abnormalities     PAST SURGICAL HISTORY: Past Surgical History:  Procedure Laterality Date  . CESAREAN SECTION  2012  . CHOLECYSTECTOMY, LAPAROSCOPIC  2004  . VAGINAL HYSTERECTOMY      FAMILY HISTORY: Family History  Problem Relation Age of Onset  . Diabetes type II Mother   . Hypertension Mother   . Fibromyalgia Mother   . Cardiomyopathy Father   . Heart attack Father     SOCIAL HISTORY:  Social History   Socioeconomic History  . Marital status: Married    Spouse name: Not on file  . Number of children: Not on file  . Years of education: Not on file  . Highest education level: Not on file  Occupational History  . Not on file  Social Needs  . Financial resource strain: Not on file  . Food insecurity:    Worry: Not on file    Inability: Not on file  . Transportation needs:    Medical: Not on file    Non-medical: Not on file  Tobacco Use  . Smoking status: Former Smoker    Packs/day: 1.00    Types: Cigarettes  . Smokeless tobacco: Never Used  Substance and Sexual Activity  . Alcohol use: No    Alcohol/week: 0.0 standard drinks  . Drug use: No  . Sexual activity: Not on file  Lifestyle  . Physical activity:    Days per week: Not on file    Minutes per session: Not on file  . Stress: Not on file  Relationships  . Social connections:    Talks on phone: Not on file    Gets together: Not on file    Attends religious service: Not on file    Active member of club or organization: Not on file    Attends meetings of clubs  or organizations: Not on file    Relationship status: Not on file  . Intimate partner violence:    Fear of current or ex partner: Not on file    Emotionally abused: Not on file    Physically abused: Not on file    Forced sexual activity: Not on file  Other Topics Concern  . Not on file  Social History Narrative  . Not on file     PHYSICAL EXAM  Vitals:   04/20/18 0954  BP: 125/80  Pulse: 70  Resp: 18  Weight: 256 lb 8 oz (116.3 kg)  Height: 5\' 8"  (1.727 m)    Body mass index is 39 kg/m.   General: The patient is well-developed and well-nourished and in no acute distress.  Neck range of motion is normal.  Neurologic Exam  Mental status: The patient is alert and oriented x 3 at the time of the examination.  Normal memory and attention.     Cranial nerves: EOM intact.  No ptosis.  Decreased color vision right.   She has a right APD.      Speech is normal.    Normal facial strength and slightly decreased left sensation.   SCM/trapezius are strong.   Hearing is symmetric         Motor:  Muscle bulk and tone are normal. Strength is  5 / 5 in the arms.  Testing the left leg is difficult as she is reporting pain.  She appears to have 5-/5 strength at the ankles and toes Coordination:   Finger-nose-finger is normal bilaterally.  Left heel-to-shin is reduced.   Sensory: On sensory testing she reports decreased sensation to touch and vibration below the knee on the left.  Sensation is symmetric in arms       Gait and station: Station is normal. The gait is arthritic and mildly wide.  Tandem gait is poor.  She does not need a cane. . Romberg is negative.  Reflexes: The left leg reflexes are mildly increased compared to the right..  .     DIAGNOSTIC DATA (LABS, IMAGING, TESTING) - I reviewed patient records, labs, notes, testing and imaging myself where available.     ASSESSMENT AND PLAN  Multiple sclerosis (HCC) - Plan: CBC with Differential/Platelet, Stratify JCV Antibody  Test ([facility])  Optic neuritis  Chronic migraine  Incomplete bladder emptying  Other fatigue  Pseudoseizure  Abnormal gait   1.    She is JCV antibody negative and we'll continue on Tysabri. Her lesion burden has remained low but she had optic neuritis and a possible history of transverse myelitis.  We need to check the JCV antibody again today and the CBC. 2.   She is now off of any convulsion and has not had any more spells.  These most likely represented pseudoseizures. 3.   Continue Adderall for  fatigue, clonazepam for sleep/anxiety 4.   Change clonazepam to ambien for insomnia 5.   Return in 6 months or sooner if there are new or worsening neurologic symptoms.     Jacoba Cherney A. Epimenio Foot, MD, PhD 04/20/2018, 10:28 AM Certified in Neurology, Clinical Neurophysiology, Sleep Medicine, Pain Medicine and Neuroimaging  [facility] [address], [address] [place], [place] [address] [phone]

## 2018-04-20 NOTE — Telephone Encounter (Signed)
Placed JCV lab in [facility] lock box for routine lab pick up.  

## 2018-04-21 ENCOUNTER — Other Ambulatory Visit: Payer: Self-pay

## 2018-04-21 ENCOUNTER — Encounter (HOSPITAL_BASED_OUTPATIENT_CLINIC_OR_DEPARTMENT_OTHER): Payer: Self-pay

## 2018-04-21 ENCOUNTER — Telehealth: Payer: Self-pay | Admitting: Neurology

## 2018-04-21 ENCOUNTER — Emergency Department (HOSPITAL_BASED_OUTPATIENT_CLINIC_OR_DEPARTMENT_OTHER)
Admission: EM | Admit: 2018-04-21 | Discharge: 2018-04-21 | Disposition: A | Discharge status: Home / Self Care | Payer: Medicare Other | Attending: Emergency Medicine | Admitting: Emergency Medicine

## 2018-04-21 DIAGNOSIS — M5442 Lumbago with sciatica, left side: Secondary | ICD-10-CM | POA: Insufficient documentation

## 2018-04-21 DIAGNOSIS — Z87891 Personal history of nicotine dependence: Secondary | ICD-10-CM | POA: Insufficient documentation

## 2018-04-21 DIAGNOSIS — M545 Low back pain: Secondary | ICD-10-CM | POA: Diagnosis present

## 2018-04-21 DIAGNOSIS — Z79899 Other long term (current) drug therapy: Secondary | ICD-10-CM | POA: Insufficient documentation

## 2018-04-21 DIAGNOSIS — Z9049 Acquired absence of other specified parts of digestive tract: Secondary | ICD-10-CM | POA: Insufficient documentation

## 2018-04-21 DIAGNOSIS — G35 Multiple sclerosis: Secondary | ICD-10-CM | POA: Insufficient documentation

## 2018-04-21 HISTORY — DX: Multiple sclerosis: G35

## 2018-04-21 HISTORY — DX: Conversion disorder with seizures or convulsions: F44.5

## 2018-04-21 HISTORY — DX: Unspecified convulsions: R56.9

## 2018-04-21 LAB — CBC WITH DIFFERENTIAL/PLATELET
BASOS: 1 %
Basophils Absolute: 0 10*3/uL (ref 0.0–0.2)
EOS (ABSOLUTE): 0.2 10*3/uL (ref 0.0–0.4)
Eos: 2 %
HEMATOCRIT: 44.7 % (ref 34.0–46.6)
Hemoglobin: 14.5 g/dL (ref 11.1–15.9)
Immature Grans (Abs): 0 10*3/uL (ref 0.0–0.1)
Immature Granulocytes: 0 %
Lymphocytes Absolute: 4.8 10*3/uL — ABNORMAL HIGH (ref 0.7–3.1)
Lymphs: 56 %
MCH: 28 pg (ref 26.6–33.0)
MCHC: 32.4 g/dL (ref 31.5–35.7)
MCV: 87 fL (ref 79–97)
Monocytes Absolute: 0.5 10*3/uL (ref 0.1–0.9)
Monocytes: 6 %
NEUTROS PCT: 35 %
Neutrophils Absolute: 3 10*3/uL (ref 1.4–7.0)
PLATELETS: 297 10*3/uL (ref 150–450)
RBC: 5.17 x10E6/uL (ref 3.77–5.28)
RDW: 13.9 % (ref 12.3–15.4)
WBC: 8.6 10*3/uL (ref 3.4–10.8)

## 2018-04-21 LAB — URINALYSIS, ROUTINE W REFLEX MICROSCOPIC
Bilirubin Urine: NEGATIVE
GLUCOSE, UA: NEGATIVE mg/dL
Hgb urine dipstick: NEGATIVE
KETONES UR: 15 mg/dL — AB
Leukocytes, UA: NEGATIVE
Nitrite: NEGATIVE
PROTEIN: NEGATIVE mg/dL
pH: 5 (ref 5.0–8.0)

## 2018-04-21 LAB — PREGNANCY, URINE: Preg Test, Ur: NEGATIVE

## 2018-04-21 MED ORDER — HYDROMORPHONE HCL 1 MG/ML IJ SOLN
1.0000 mg | Freq: Once | INTRAMUSCULAR | Status: AC
  Administered 2018-04-21: 1 mg via INTRAMUSCULAR
  Filled 2018-04-21: qty 1

## 2018-04-21 MED ORDER — OXYCODONE-ACETAMINOPHEN 5-325 MG PO TABS
1.0000 | ORAL_TABLET | Freq: Once | ORAL | Status: AC
  Administered 2018-04-21: 1 via ORAL
  Filled 2018-04-21: qty 1

## 2018-04-21 MED ORDER — ONDANSETRON 4 MG PO TBDP
4.0000 mg | ORAL_TABLET | Freq: Once | ORAL | Status: AC
  Administered 2018-04-21: 4 mg via ORAL
  Filled 2018-04-21: qty 1

## 2018-04-21 MED ORDER — OXYCODONE-ACETAMINOPHEN 5-325 MG PO TABS: 1.0000 | ORAL_TABLET | ORAL | 0 refills | Status: DC | PRN

## 2018-04-21 NOTE — ED Notes (Signed)
Pt was able to ambulate to restroom with minimal assistance. Discomfort was noted

## 2018-04-21 NOTE — ED Notes (Signed)
Pt made aware that we need a urine sample.

## 2018-04-21 NOTE — ED Notes (Signed)
Pt understood dc material. NAD noted. Script given at [employer] [employer]. Pt able to ambulate but was assisted to wheelchair and helped into vehicle.

## 2018-04-21 NOTE — ED Triage Notes (Signed)
Pt c/o low back pain that radiates down L leg. Pt states she is also having some incontinence with dribbling.

## 2018-04-21 NOTE — Telephone Encounter (Signed)
LMOM for Caitlin [name] that it is ok for pt. to have oral steroids while on Tysabri./fim

## 2018-04-21 NOTE — ED Provider Notes (Signed)
[facility] Provider Note: Lowella Dell, MD, FACEP  CSN: [id number] MRN: [id number] ARRIVAL: 04/21/18 at 1943 ROOM: MH10/MH10   CHIEF COMPLAINT  Back Pain   HISTORY OF PRESENT ILLNESS  04/21/18 11:14 PM Caitlin [name] is a 32 y.o. female with history of multiple sclerosis and back surgery.  She is here with a 4-day history of low back pain.  The pain has come on gradually and is now become severe.  It is across her lower back with radiation down the back of her right leg.  Pain is worse with movement of her right hip.  She has baseline paresthesias of the lower extremities, left greater than right, but no change from her baseline.  She denies associated weakness.  She denies saddle anesthesia.  She had one brief episode of urinary incontinence earlier this week but none since.  She has had no other changes in bowel or bladder function.  She was seen at an urgent care this morning and started on prednisone.  She has not been taking any narcotics.   Past Medical History:  Diagnosis Date  . Headache   . MS (multiple sclerosis) (HCC)   . Pseudoseizures   . Vision abnormalities     Past Surgical History:  Procedure Laterality Date  . CESAREAN SECTION  2012  . CHOLECYSTECTOMY, LAPAROSCOPIC  2004  . KNEE SURGERY    . LUMBAR LAMINECTOMY    . VAGINAL HYSTERECTOMY      Family History  Problem Relation Age of Onset  . Diabetes type II Mother   . Hypertension Mother   . Fibromyalgia Mother   . Cardiomyopathy Father   . Heart attack Father     Social History   Tobacco Use  . Smoking status: Former Smoker    Packs/day: 1.00    Types: Cigarettes  . Smokeless tobacco: Never Used  Substance Use Topics  . Alcohol use: No    Alcohol/week: 0.0 standard drinks  . Drug use: No    Prior to Admission medications   Medication Sig Start Date End Date Taking? Authorizing Provider  lamoTRIgine (LAMICTAL) 100 MG tablet Take 100 mg by mouth daily.   Yes [provider]  amphetamine-dextroamphetamine (ADDERALL) 20 MG tablet TAKE 1 TABLET BY MOUTH 2 TIMES A DAY 03/29/18   Sater, Pearletha Furl, MD  Fremanezumab-vfrm (AJOVY) 225 MG/1.5ML SOSY Inject 1 Syringe into the skin every 28 (twenty-eight) days. 10/07/17   Sater, Pearletha Furl, MD  natalizumab (TYSABRI) 300 MG/15ML injection Inject into the vein.    [provider]  tamsulosin (FLOMAX) 0.4 MG CAPS capsule Take 1 capsule (0.4 mg total) by mouth daily. 04/20/18   Sater, Pearletha Furl, MD  topiramate (TOPAMAX) 100 MG tablet TAKE 1 & 1/2 TABLETS BY MOUTH DAILY 04/20/18   Sater, Pearletha Furl, MD    Allergies Ciprofloxacin and Influenza vaccines   REVIEW OF SYSTEMS  Negative except as noted here or in the History of Present Illness.   PHYSICAL EXAMINATION  Initial Vital Signs Blood pressure 107/88, pulse 64, temperature 98 F (36.7 C), temperature source Oral, resp. rate 16, height 5\' 8"  (1.727 m), weight 113.4 kg, SpO2 100 %.  Examination General: Well-developed, well-nourished female in no acute distress; appearance consistent with age of record HENT: normocephalic; atraumatic Eyes: pupils equal, round and reactive to light; extraocular muscles intact Neck: supple Heart: regular rate and rhythm Lungs: clear to auscultation bilaterally Abdomen soft; nondistended; nontender; bowel sounds present Back: Bilateral paralumbar tenderness; positive straight leg  raise bilaterally at 10 degrees Extremities: No deformity; full range of motion; pulses normal Neurologic: Awake, alert and oriented; motor function intact in all extremities and symmetric; sensation decreased in lower extremities, left greater than right, consistent with patient's chronic MS related changes; no facial droop; no saddle anesthesia Skin: Warm and dry Psychiatric: Flat affect   RESULTS  Summary of this visit's results, reviewed by myself:   EKG Interpretation  Date/Time:    Ventricular Rate:    PR Interval:    QRS  Duration:   QT Interval:    QTC Calculation:   R Axis:     Text Interpretation:        Laboratory Studies: Results for orders placed or performed during the hospital encounter of 04/21/18 (from the past 24 hour(s))  Pregnancy, urine     Status: None   Collection Time: 04/21/18 10:45 PM  Result Value Ref Range   Preg Test, Ur NEGATIVE NEGATIVE  Urinalysis, Routine w reflex microscopic     Status: Abnormal   Collection Time: 04/21/18 10:45 PM  Result Value Ref Range   Color, Urine YELLOW YELLOW   APPearance HAZY (A) CLEAR   Specific Gravity, Urine >1.030 (H) 1.005 - 1.030   pH 5.0 5.0 - 8.0   Glucose, UA NEGATIVE NEGATIVE mg/dL   Hgb urine dipstick NEGATIVE NEGATIVE   Bilirubin Urine NEGATIVE NEGATIVE   Ketones, ur 15 (A) NEGATIVE mg/dL   Protein, ur NEGATIVE NEGATIVE mg/dL   Nitrite NEGATIVE NEGATIVE   Leukocytes, UA NEGATIVE NEGATIVE   Imaging Studies: No results found.  ED COURSE and MDM  Nursing notes and initial vitals signs, including pulse oximetry, reviewed.  Vitals:   04/21/18 1949 04/21/18 1954 04/21/18 2222  BP:  (!) 110/55 107/88  Pulse:  88 64  Resp:  20 16  Temp:  98 F (36.7 C)   TempSrc:  Oral   SpO2:  100% 100%  Weight: 113.4 kg    Height: 5\' 8"  (1.727 m)     Patient's history and exam consistent with sciatica.  I do not see evidence of cauda equina syndrome on exam and the one episode of urinary incontinence was isolated and has not recurred.  She has a neurologist she saw yesterday for her MS (she receives regular infusions) and can follow-up with him if symptoms persist.  Consultation with the [place] state controlled substances database reveals the patient has received no opioid prescriptions in the past 2 years.  PROCEDURES    ED DIAGNOSES     ICD-10-CM   1. Acute bilateral low back pain with left-sided sciatica M54.42        Farris Geiman, Jonny Ruiz, MD 04/21/18 2329

## 2018-04-21 NOTE — Telephone Encounter (Signed)
Claybon Jabs a PA with [facility] called in wanting to know if it is okay for her to put [facility] on prednisone for sciatic nerve pain that she is having. She wants to make sure its okay with the Ts=ysabir. The best way to contact Irving Burton is her cell phone at [phone].

## 2018-04-27 NOTE — Telephone Encounter (Signed)
JCV ab negative, titer: 0.13. Given to Dr. Epimenio Foot to review.

## 2018-04-29 ENCOUNTER — Other Ambulatory Visit: Payer: Self-pay | Admitting: Neurology

## 2018-05-16 ENCOUNTER — Telehealth: Payer: Self-pay | Admitting: Neurology

## 2018-05-16 NOTE — Telephone Encounter (Signed)
Pt called for infusion suite, call was transferred.

## 2018-05-30 ENCOUNTER — Other Ambulatory Visit: Payer: Self-pay | Admitting: Neurology

## 2018-06-02 ENCOUNTER — Telehealth: Payer: Self-pay | Admitting: Neurology

## 2018-06-02 NOTE — Telephone Encounter (Signed)
pt has called for the intrafusion suite, call transferred °

## 2018-06-11 ENCOUNTER — Encounter (HOSPITAL_BASED_OUTPATIENT_CLINIC_OR_DEPARTMENT_OTHER): Payer: Self-pay | Admitting: *Deleted

## 2018-06-11 ENCOUNTER — Other Ambulatory Visit: Payer: Self-pay

## 2018-06-11 ENCOUNTER — Emergency Department (HOSPITAL_BASED_OUTPATIENT_CLINIC_OR_DEPARTMENT_OTHER)
Admission: EM | Admit: 2018-06-11 | Discharge: 2018-06-12 | Disposition: A | Discharge status: Home / Self Care | Payer: Medicare Other | Attending: Emergency Medicine | Admitting: Emergency Medicine

## 2018-06-11 DIAGNOSIS — Z79899 Other long term (current) drug therapy: Secondary | ICD-10-CM | POA: Diagnosis not present

## 2018-06-11 DIAGNOSIS — G35 Multiple sclerosis: Secondary | ICD-10-CM | POA: Insufficient documentation

## 2018-06-11 DIAGNOSIS — N12 Tubulo-interstitial nephritis, not specified as acute or chronic: Secondary | ICD-10-CM

## 2018-06-11 DIAGNOSIS — Z87891 Personal history of nicotine dependence: Secondary | ICD-10-CM | POA: Insufficient documentation

## 2018-06-11 DIAGNOSIS — N10 Acute tubulo-interstitial nephritis: Secondary | ICD-10-CM | POA: Insufficient documentation

## 2018-06-11 DIAGNOSIS — M5489 Other dorsalgia: Secondary | ICD-10-CM | POA: Diagnosis present

## 2018-06-11 DIAGNOSIS — R339 Retention of urine, unspecified: Secondary | ICD-10-CM | POA: Insufficient documentation

## 2018-06-11 DIAGNOSIS — R338 Other retention of urine: Secondary | ICD-10-CM

## 2018-06-11 HISTORY — DX: Dorsalgia, unspecified: M54.9

## 2018-06-11 LAB — URINALYSIS, ROUTINE W REFLEX MICROSCOPIC
Bilirubin Urine: NEGATIVE
Glucose, UA: NEGATIVE mg/dL
KETONES UR: 15 mg/dL — AB
Nitrite: NEGATIVE
Protein, ur: 100 mg/dL — AB
Specific Gravity, Urine: >1.03 — ABNORMAL HIGH (ref 1.005–1.030)
pH: 5.5 (ref 5.0–8.0)

## 2018-06-11 LAB — URINALYSIS, MICROSCOPIC (REFLEX): WBC, UA: >50 WBC/hpf (ref 0–5)

## 2018-06-11 LAB — CBC WITH DIFFERENTIAL/PLATELET
Abs Immature Granulocytes: 0.07 10*3/uL (ref 0.00–0.07)
Basophils Absolute: 0.1 10*3/uL (ref 0.0–0.1)
Basophils Relative: 0 %
Eosinophils Absolute: 0.2 10*3/uL (ref 0.0–0.5)
Eosinophils Relative: 1 %
HCT: 45 % (ref 36.0–46.0)
Hemoglobin: 14.9 g/dL (ref 12.0–15.0)
Immature Granulocytes: 0 %
Lymphocytes Relative: 28 %
Lymphs Abs: 4.7 10*3/uL — ABNORMAL HIGH (ref 0.7–4.0)
MCH: 28.7 pg (ref 26.0–34.0)
MCHC: 33.1 g/dL (ref 30.0–36.0)
MCV: 86.7 fL (ref 80.0–100.0)
MONO ABS: 1 10*3/uL (ref 0.1–1.0)
Monocytes Relative: 6 %
Neutro Abs: 10.9 10*3/uL — ABNORMAL HIGH (ref 1.7–7.7)
Neutrophils Relative %: 65 %
Platelets: 277 10*3/uL (ref 150–400)
RBC: 5.19 MIL/uL — ABNORMAL HIGH (ref 3.87–5.11)
RDW: 14.5 % (ref 11.5–15.5)
WBC: 16.9 10*3/uL — ABNORMAL HIGH (ref 4.0–10.5)
nRBC: 0.3 % — ABNORMAL HIGH (ref 0.0–0.2)

## 2018-06-11 LAB — BASIC METABOLIC PANEL
Anion gap: 5 (ref 5–15)
BUN: 12 mg/dL (ref 6–20)
CO2: 25 mmol/L (ref 22–32)
Calcium: 9.1 mg/dL (ref 8.9–10.3)
Chloride: 108 mmol/L (ref 98–111)
Creatinine, Ser: 0.72 mg/dL (ref 0.44–1.00)
GFR calc Af Amer: >60 mL/min (ref >60)
GFR calc non Af Amer: >60 mL/min (ref >60)
Glucose, Bld: 105 mg/dL — ABNORMAL HIGH (ref 70–99)
Potassium: 3.4 mmol/L — ABNORMAL LOW (ref 3.5–5.1)
Sodium: 138 mmol/L (ref 135–145)

## 2018-06-11 LAB — PREGNANCY, URINE: Preg Test, Ur: NEGATIVE

## 2018-06-11 MED ORDER — SODIUM CHLORIDE 0.9 % IV BOLUS
1000.0000 mL | Freq: Once | INTRAVENOUS | Status: AC
  Administered 2018-06-11: 1000 mL via INTRAVENOUS

## 2018-06-11 MED ORDER — SODIUM CHLORIDE 0.9 % IV SOLN
1.0000 g | Freq: Once | INTRAVENOUS | Status: AC
  Administered 2018-06-11: 1 g via INTRAVENOUS
  Filled 2018-06-11: qty 10

## 2018-06-11 MED ORDER — MORPHINE SULFATE (PF) 4 MG/ML IV SOLN
6.0000 mg | Freq: Once | INTRAVENOUS | Status: AC
  Administered 2018-06-11: 6 mg via INTRAVENOUS
  Filled 2018-06-11: qty 2

## 2018-06-11 MED ORDER — CEPHALEXIN 500 MG PO CAPS: 500.0000 mg | ORAL_CAPSULE | Freq: Two times a day (BID) | ORAL | 0 refills | Status: DC

## 2018-06-11 NOTE — ED Notes (Addendum)
Pt states she has hx of back pain, had back surgery 08/26/2015. Pt c/o lower back pain with radiation down left leg  x 1 week, but now pt states she is unable to urinate. States she urinated this am, however has urge to go and cannot pass urine. Denies N/V, denies fever, denies taking pain medication.

## 2018-06-11 NOTE — ED Provider Notes (Addendum)
[facility] EMERGENCY DEPARTMENT Provider Note   CSN: [id number] Arrival date & time: 06/11/18  2051     History   Chief Complaint Chief Complaint  Patient presents with  . Back Pain    HPI Caitlin [name] is a 33 y.o. female.  Patient with history of back surgery in 2017 from disc herniation, multiple sclerosis compliant with medication follows with neurology, recurrent back pain worsened since the holidays presents with worsening back pain mild radiation down the left leg and difficulty with urination.  Patient has had all the symptoms before however there worse today especially the difficulty with urination.  Patient's had pain down the left leg and mild left leg weakness with her MS.  Patient denies fevers or chills.  Pain worse with range of motion.  Patient does have urinary symptoms including dysuria and urinating small amounts at a time.     Past Medical History:  Diagnosis Date  . Back pain   . Headache   . MS (multiple sclerosis) (HCC)   . Pseudoseizures   . Vision abnormalities     Patient Active Problem List   Diagnosis Date Noted  . Chronic migraine 10/07/2017  . Insomnia 10/07/2017  . Left foot drop 11/02/2016  . 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 Imbalance 09/25/2015  . Encounter for follow-up examination after completed treatment for conditions other than malignant neoplasm 09/10/2015  . 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 Cervical disc disease 07/26/2015  . Degeneration of intervertebral disc of lumbar region 07/26/2015  . Lumbar radiculopathy 07/26/2015  . 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 Acute low back pain 07/09/2015  . Elevated lymphocyte count 02/12/2015  . Pain of upper abdomen 02/12/2015  . Hemorrhage, petechial 02/12/2015  . Gonalgia 01/29/2015  . Knee MCL sprain 01/29/2015  . Other headache syndrome 11/26/2014  . Neck pain 11/26/2014  . Headache disorder 11/26/2014  . Encounter for surgical  follow-up care 11/14/2014  . Other fatigue 11/13/2014  . Fatigue 11/13/2014  . Incomplete bladder emptying 11/09/2014  . 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 Gait disturbance 07/11/2014  . Abnormal gait 07/11/2014  . Multiple sclerosis (HCC) 06/20/2014  . Optic neuritis 06/20/2014  . Numbness 06/20/2014  . 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 Adjustment disorder with mixed anxiety and depressed mood 06/20/2014  . Urinary frequency 06/20/2014  . FOM (frequency of micturition) 06/20/2014  . Dislocated patella 11/28/2013    Past Surgical History:  Procedure Laterality Date  . BACK SURGERY    . CESAREAN SECTION  2012  . CHOLECYSTECTOMY, LAPAROSCOPIC  2004  . KNEE SURGERY    . LUMBAR LAMINECTOMY    . VAGINAL HYSTERECTOMY       OB History   No obstetric history on file.      Home Medications    Prior to Admission medications   Medication Sig Start Date End Date Taking? Authorizing Provider  cyclobenzaprine (FLEXERIL) 10 MG tablet Take 10 mg by mouth 3 (three) times daily as needed for muscle spasms.   Yes [provider]  amphetamine-dextroamphetamine (ADDERALL) 20 MG tablet TAKE 1 TABLET BY MOUTH 2 TIMES A DAY 05/30/18   Sater, Pearletha Furl, MD  cephALEXin (KEFLEX) 500 MG capsule Take 1 capsule (500 mg total) by mouth 2 (two) times daily. 06/12/18   Blane Ohara, MD  Fremanezumab-vfrm (AJOVY) 225 MG/1.5ML SOSY Inject 1 Syringe into the skin every 28 (twenty-eight) days. 10/07/17   Sater, Pearletha Furl,  MD  lamoTRIgine (LAMICTAL) 100 MG tablet Take 100 mg by mouth daily.    [provider]  natalizumab (TYSABRI) 300 MG/15ML injection Inject into the vein.    [provider]  oxyCODONE-acetaminophen (PERCOCET) 5-325 MG tablet Take 1 tablet by mouth every 4 (four) hours as needed for severe pain. 04/21/18   Molpus, John, MD  tamsulosin (FLOMAX) 0.4 MG CAPS capsule Take 1 capsule (0.4 mg total) by mouth  daily. 04/20/18   Sater, Pearletha Furlichard A, MD  topiramate (TOPAMAX) 100 MG tablet TAKE 1 & 1/2 TABLETS BY MOUTH DAILY 04/20/18   Sater, Pearletha Furlichard A, MD    Family History Family History  Problem Relation Age of Onset  . Diabetes type II Mother   . Hypertension Mother   . Fibromyalgia Mother   . Cardiomyopathy Father   . Heart attack Father     Social History Social History   Tobacco Use  . Smoking status: Former Smoker    Packs/day: 1.00    Types: Cigarettes  . Smokeless tobacco: Never Used  Substance Use Topics  . Alcohol use: No    Alcohol/week: 0.0 standard drinks  . Drug use: No     Allergies   Ciprofloxacin and Influenza vaccines   Review of Systems Review of Systems  Constitutional: Negative for chills and fever.  HENT: Negative for congestion.   Eyes: Negative for visual disturbance.  Respiratory: Negative for shortness of breath.   Cardiovascular: Negative for chest pain.  Gastrointestinal: Negative for abdominal pain and vomiting.  Genitourinary: Negative for dysuria and flank pain.  Musculoskeletal: Negative for back pain, neck pain and neck stiffness.  Skin: Negative for rash.  Neurological: Negative for light-headedness and headaches.     Physical Exam Updated Vital Signs BP 108/62 (BP Location: Left Arm)   Pulse 82   Temp 98 F (36.7 C) (Oral)   Resp 18   Ht 5\' 8"  (1.727 m)   Wt 113.9 kg   SpO2 100%   BMI 38.16 kg/m   Physical Exam Vitals signs and nursing note reviewed.  Constitutional:      Appearance: She is well-developed.  HENT:     Head: Normocephalic and atraumatic.  Eyes:     General:        Right eye: No discharge.        Left eye: No discharge.  Neck:     Musculoskeletal: Normal range of motion and neck supple.     Trachea: No tracheal deviation.  Cardiovascular:     Rate and Rhythm: Normal rate and regular rhythm.  Pulmonary:     Effort: Pulmonary effort is normal.     Breath sounds: Normal breath sounds.  Abdominal:      General: There is no distension.     Palpations: Abdomen is soft.     Tenderness: There is no abdominal tenderness. There is no guarding.  Musculoskeletal:        General: Tenderness present. No signs of injury.  Skin:    General: Skin is warm.     Findings: No rash.  Neurological:     Mental Status: She is alert and oriented to person, place, and time.     Cranial Nerves: Cranial nerves are intact.     Deep Tendon Reflexes:     Reflex Scores:      Patellar reflexes are 2+ on the right side and 2+ on the left side.      Achilles reflexes are 2+ on the right side and  2+ on the left side.    Comments: Patient has 5+ strength with flexion extension of major joints however slightly decreased strength with toe flexion and more pain with flexion of left leg versus right.  Patient has tenderness to palpation paraspinal lumbar.      ED Treatments / Results  Labs (all labs ordered are listed, but only abnormal results are displayed) Labs Reviewed  URINALYSIS, ROUTINE W REFLEX MICROSCOPIC - Abnormal; Notable for the following components:      Result Value   APPearance CLOUDY (*)    Specific Gravity, Urine >1.030 (*)    Hgb urine dipstick LARGE (*)    Ketones, ur 15 (*)    Protein, ur 100 (*)    Leukocytes, UA SMALL (*)    All other components within normal limits  CBC WITH DIFFERENTIAL/PLATELET - Abnormal; Notable for the following components:   WBC 16.9 (*)    RBC 5.19 (*)    nRBC 0.3 (*)    Neutro Abs 10.9 (*)    Lymphs Abs 4.7 (*)    All other components within normal limits  BASIC METABOLIC PANEL - Abnormal; Notable for the following components:   Potassium 3.4 (*)    Glucose, Bld 105 (*)    All other components within normal limits  URINALYSIS, MICROSCOPIC (REFLEX) - Abnormal; Notable for the following components:   Bacteria, UA MANY (*)    All other components within normal limits  URINE CULTURE  PREGNANCY, URINE    EKG None  Radiology No results  found.  Procedures Procedures (including critical care time)  Medications Ordered in ED Medications  cefTRIAXone (ROCEPHIN) 1 g in sodium chloride 0.9 % 100 mL IVPB (1 g Intravenous New Bag/Given 06/11/18 2349)  sodium chloride 0.9 % bolus 1,000 mL (1,000 mLs Intravenous New Bag/Given 06/11/18 2229)  morphine 4 MG/ML injection 6 mg (6 mg Intravenous Given 06/11/18 2218)     Initial Impression / Assessment and Plan / ED Course  I have reviewed the triage vital signs and the nursing notes.  Pertinent labs & imaging results that were available during my care of the patient were reviewed by me and considered in my medical decision making (see chart for details).    Patient with history of back surgery and multiple sclerosis presents with urinary symptoms and difficulty with urination and worsening back pain.  Patient's had mostly symptoms before however they are all worse today especially the difficulty with urination.  We discussed the broad differential diagnosis including MS lesions, disc herniation, Pyelo/UTI.  Her urine does appear infected and she has urinary symptoms.  Patient has no focal weakness on exam.  Patient has good outpatient follow-up and unable to do MRI at this institution at this time.  We agreed to antibiotics, pain control and close follow-up on Monday to decide if she needs MRI.  If patient worsens in the meantime she will go to [facility] or another ER to be assessed for possible MRI.  Pain improved in the ER.  Blood work reviewed leukocytosis likely from UTI, pt denies steroid use. Pt was able to urinate in ED however smaller amount.   Results and differential diagnosis were discussed with the patient/parent/guardian. Xrays were independently reviewed by myself.  Close follow up outpatient was discussed, comfortable with the plan.   Medications  cefTRIAXone (ROCEPHIN) 1 g in sodium chloride 0.9 % 100 mL IVPB (1 g Intravenous New Bag/Given 06/11/18 2349)  sodium chloride 0.9 %  bolus 1,000 mL (1,000  mLs Intravenous New Bag/Given 06/11/18 2229)  morphine 4 MG/ML injection 6 mg (6 mg Intravenous Given 06/11/18 2218)    Vitals:   06/11/18 2054 06/11/18 2057 06/11/18 2349  BP:  112/89 108/62  Pulse:  (!) 103 82  Resp:  20 18  Temp:  98 F (36.7 C)   TempSrc:  Oral   SpO2:  100% 100%  Weight: 113.9 kg    Height: 5\' 8"  (1.727 m)      Final diagnoses:  Pyelonephritis  Acute urinary retention  Multiple sclerosis (HCC)     Final Clinical Impressions(s) / ED Diagnoses   Final diagnoses:  Pyelonephritis  Acute urinary retention  Multiple sclerosis [facility](HCC)    ED Discharge Orders         Ordered    cephALEXin (KEFLEX) 500 MG capsule  2 times daily     06/11/18 2337           Blane OharaZavitz, Juergen Hardenbrook, MD 06/12/18 Marlyne [id number]    Blane OharaZavitz, Trey Gulbranson, MD 06/12/18 0003

## 2018-06-11 NOTE — Discharge Instructions (Addendum)
Follow-up closely with your primary doctor and neurologist as discussed Monday Start taking antibiotics tomorrow evening as the antibiotics she received this evening last 24 hours. Return for new weakness in your lower extremities, inability urinate or other concerns you need to be seen immediately either with your neurologist or the emergency room as you may need an MRI.

## 2018-06-11 NOTE — ED Triage Notes (Signed)
Pt has hx of back pain and back surgery. Reports pain flared up in Dec. States today pain is worse and she "can't pee". Pain radiates down left leg

## 2018-06-12 NOTE — ED Notes (Signed)
Pt and family understood dc material. NAD Noted. Script given at [facility]. All questions answered to satisfaction.

## 2018-06-13 LAB — URINE CULTURE: Culture: NO GROWTH

## 2018-06-27 ENCOUNTER — Telehealth: Payer: Self-pay | Admitting: *Deleted

## 2018-06-27 NOTE — Telephone Encounter (Signed)
Faxed completed/signed Tysabri pt status report and reauth questionnaire to [facility] at [phone]. Received confirmation.  

## 2018-07-04 ENCOUNTER — Other Ambulatory Visit: Payer: Self-pay | Admitting: Neurology

## 2018-07-14 ENCOUNTER — Telehealth: Payer: Self-pay | Admitting: *Deleted

## 2018-07-14 NOTE — Telephone Encounter (Signed)
Gave completed/signed new Tysabri orders back to [facility]. Included OV note and MRI reports as requested.

## 2018-08-01 ENCOUNTER — Other Ambulatory Visit: Payer: Self-pay | Admitting: Neurology

## 2018-08-02 NOTE — Telephone Encounter (Signed)
Received From [employer] Approval for tysabri 300mg  IV every 4 wks.  Start 07-27-18 thru 07-26-19.   Approval letter given to [facility].

## 2018-09-05 ENCOUNTER — Telehealth: Payer: Self-pay | Admitting: Neurology

## 2018-09-05 ENCOUNTER — Other Ambulatory Visit: Payer: Self-pay | Admitting: Neurology

## 2018-09-05 NOTE — Telephone Encounter (Signed)
Pt has called for the infusion suite, call connected. °

## 2018-10-05 ENCOUNTER — Other Ambulatory Visit: Payer: Self-pay | Admitting: Neurology

## 2018-10-17 ENCOUNTER — Telehealth: Payer: Self-pay | Admitting: *Deleted

## 2018-10-17 NOTE — Telephone Encounter (Signed)
Called pt. Received fax that PA emgality was approved via [employer]. However, we have that she takes Ajovy. I confirmed she is still on Ajovy but her insurance changed (now has [employer], [employer]). emagality now on formulary, not Ajovy. She has been on Ajovy for about a year. She will discuss potentially switching at her f/u with Dr. Felecia Shelling on 10/20/18.   I updated med list, pharmacy, allergies . Went over new check in process. Cleared covid-19 screening.

## 2018-10-20 ENCOUNTER — Telehealth: Payer: Self-pay | Admitting: *Deleted

## 2018-10-20 ENCOUNTER — Other Ambulatory Visit: Payer: Self-pay

## 2018-10-20 ENCOUNTER — Encounter: Payer: Self-pay | Admitting: Neurology

## 2018-10-20 ENCOUNTER — Ambulatory Visit (INDEPENDENT_AMBULATORY_CARE_PROVIDER_SITE_OTHER): Payer: BC Managed Care – PPO | Admitting: Neurology

## 2018-10-20 VITALS — BP 120/76 | HR 80 | Temp 98.2°F | Ht 68.0 in | Wt 250.5 lb

## 2018-10-20 DIAGNOSIS — M21372 Foot drop, left foot: Secondary | ICD-10-CM

## 2018-10-20 DIAGNOSIS — G35 Multiple sclerosis: Secondary | ICD-10-CM

## 2018-10-20 DIAGNOSIS — H469 Unspecified optic neuritis: Secondary | ICD-10-CM | POA: Diagnosis not present

## 2018-10-20 DIAGNOSIS — Z79899 Other long term (current) drug therapy: Secondary | ICD-10-CM

## 2018-10-20 DIAGNOSIS — G43709 Chronic migraine without aura, not intractable, without status migrainosus: Secondary | ICD-10-CM

## 2018-10-20 DIAGNOSIS — R5383 Other fatigue: Secondary | ICD-10-CM

## 2018-10-20 DIAGNOSIS — IMO0002 Reserved for concepts with insufficient information to code with codable children: Secondary | ICD-10-CM

## 2018-10-20 MED ORDER — EMGALITY 120 MG/ML ~~LOC~~ SOAJ: 1.0000 | SUBCUTANEOUS | 4 refills | Status: DC

## 2018-10-20 NOTE — Telephone Encounter (Signed)
Submitted PA emagality on CMM. Key: [id number].  [id number];UUEKCM:[id number];Review Type:Prior Auth;Coverage Start Date:09/20/2018;Coverage End Date:10/20/2019;

## 2018-10-20 NOTE — Telephone Encounter (Signed)
Placed [facility] lab in [facility] lock box for routine lab pick up. Results pending. 

## 2018-10-20 NOTE — Progress Notes (Signed)
[facility] PATIENT: Caitlin [name] DOB: 01/17/1986  REFERRING CLINICIAN: Fredderick Severanceipton, John  HISTORY FROM: patient REASON FOR VISIT: MS   HISTORICAL  CHIEF COMPLAINT:  Chief Complaint  Patient presents with   Follow-up    RM 12, alone. Last seen 04/20/18.    Multiple Sclerosis    On Tysabri. DUE FOR JCV TODAY. Last infusion: 10/05/18 and next one scheduled for 11/02/18   Migraine    Wants to discuss Ajovy. Her insurance changed and emaglity now on formulary, not Ajovy.     HISTORY OF PRESENT ILLNESS:  Caitlin [name] is a 33 y.o. 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      Update 10/20/2018: She feels her MS is mostly stable though there was some blurry vision OD for about 4 days about 10 days ago.      The blurriness was worse the first 2 days then improved.   She had ON in the past but current episode.  Her gait is doing well.  No recent falls.   Strength and sensation are fine.   She has had more UTIs the last few months and has retention.   Flomax had not helped.     She has fatigue daily.   Sleep is variable.    Mornings are better than afternoons.  She has restless legs at night causing insomnia some nights.  She has RLS some days as well.   Moving around helps.   Gabapentin did not help.  Mood is doing better.     Adderall helps the fatigue and focus.     Her migraines were doing well on Ajovy.  She has had only 2 migraines in 6 months and some milder ones.  Insurance now prefers [employer] offic[employer]gality.      She has not had any more spells of altered consciousness.    Update 04/20/2018: She is on Tysabri as her disease modifying therapy.  She tolerates it well.  JCV antibody 6 - 7 months ago was negative at 0.17.  She has never had a positive JCV antibody.  She denies any exacerbations.   Her gait is doing ok.  She has no recent falls.   She tires out easily while walking.    Strength/sensation are the same.   She has more urinary urgency and has had some incontinence.   She has hesitancy and frequent UTI's.   She does not think she has a UTI.    She stopped tamsulosin.   She did not think it helped much.  She has fatigue in the afternoons and some cognitive fog.   Adderall has helped.     MRI's have shown mild number of small brain lesions but she did have optic neuritis in the past.   She has not had any more spells.  She is off all anticonvulsants now.  Migraines are much better on Ajovy.   She tolerates it well.   She has had only 2 migraines and several regular headaches the last 3 months.  She is on Topamax also.  Her lower back is hurting and radiates down her leg at times.      Update 10/07/2017: She feels at the MS has been mostly stable.  She is on Tysabri and she tolerates it well.  Her last JCV antibody checked 7 months ago was negative at 0.13.  There have not been any exacerbations.  She does not note any changes in her gait, strength or sensation.  She continues to have some  difficulty with balance and no longer uses a cane.  No recent falls.   She has tringling in her left hand and arm ut gabapentin had not helped.     She has some urinary frequency and urgency but no incontinence.  She has had some hesitancy in the past and was on Flomax helps her empty betterr.  She has had one recent urinary tract infection.  Vision is doing well.  She reports difficulty with insomnia despite taking clonazepam at night.  She tried trazodone in addition but that did not help.  She has headaches daily (30/30) and migraines > 15 days/month for > 4 hours.   Many wake her up in the early morning.   The are usually on her right.  Often she has right neck pain.    These occur despite being on Topamax as a prophylactic agent.  She takes Relpax but the benefit was minimal so she stopped    Update 04/08/2017:   Her MS has been stable on Tysabri.   She denies any change in gait, strength or sensation.   Balance improved with vestibular therapy.   She wears a left AFO brace for  longer distances.    Bladder seems stable with some urgency.  She takes Flomax if any hesitancy (rare now) but has had a few UTI's.  Vision is fine.    She still has had a few spells with the shaking spells (last one 3 weeks ago and prior one three weeks earlier).    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      Balance is better with [facility]hab [facility].    Migraines have been much better on Topamax.   However, she has had more the past month.    If one occurs she takes Relpax.   About 3-4 weeks ago, a daily right sided occipital headache has occurred.  She wakes up with this headache.   It has intensified into a migraine every couple days the past 2-3 weeks  From 12/01/2016:  MS:   She is on Tysabri and tolerates it well. JCV antibodies 09/27/2016 were indeterminate (0.2 to 0.4). She reports weakness in the left leg. Despite a lot of symptoms, she has a very low plaque burden on the brain MRI and no lesions have been seen in her spine.   MS was diagnosed after an episode of optic neuritis and having a couple small non-specific foci on brain MRI.    A couple years earlier she had a possible transverse myelitis (vs mild GBS).        MRI of the cervical spine 03/02/2016 was essentially normal. There was no evidence of cervical myelopathy and just disc bulge at 2 levels.  The MRI of the brain 06/01/2016 is unchanged compared to the MRI of the brain 01/31/2016 and showed no acute findings.     Pain:  She reports being in a lot more pain and is sitting in a wheelchair today.   Her worse pain is in her hands and legs and neck into her shoulders.   She does not feel she is getting any benefit from her medications.   She had lumbar surgery at L5S1 April 2017.     PseudoseizuresShe [facility] and was felt to be having pseudoseizures and was d/c home.   Earlier in the year, she was hospitalized and had EEG during one of the spells c/w pseudoseizures.  Of note, she is on lamotrigine and Topamax for pain and psych issues.     Mood:  She has anxiety and depression. She has pseudoseizures.   She is on Cymbalta (10 mg at bedtime) without much benefit.   She was seen once by psychiatry in the hospital.   She [facility] every 2-3 weeks.   .     Gait/strength/numbness:  She reports poor balance and drags the left foot. She reports falling several times.    She notes weakness in the left foot and she feels there ia also mild weakness of the left hand.     She is doing therapy twice weekly  Bladder/bowel: She feels bowel and bladder are doing well.  Vision:   She had right optic neuritis.   Acuity improved but bright lights bother that eye.   Colors are mildly desaturated out of right eye.    She notes reduced right peripheral vision.      Fatigue/sleep:  She reports a lot of fatigue despite 20 mg x 2 Adderall daily.     She falls asleep well with clonazepam 1 mg and trazodone 100 mg but still has sleep maintenance issues.      REVIEW OF SYSTEMS:  Constitutional: No fevers, chills, sweats, or change in appetite.   She has fatigue and insomnia Eyes: as above Ear, nose and throat: No hearing loss, ear pain, nasal congestion, sore throat Cardiovascular: No chest pain, palpitations Respiratory:  No shortness of breath at rest or with exertion.   No wheezes GastrointestinaI: No nausea, vomiting, diarrhea, abdominal pain, fecal incontinence Genitourinary:  She reports hesitancy.     She notes nocturia. Musculoskeletal:  No neck pain, some back pain Integumentary: No rash, pruritus, skin lesions Neurological: as above Psychiatric: Some depression at this time.  Some anxiety Endocrine: No palpitations, diaphoresis, change in appetite, change in weigh or increased thirst  ALLERGIES: Allergies  Allergen Reactions   Ciprofloxacin Anaphylaxis   Influenza Vaccines Other (See Comments)    Hx of Guillain Barre    HOME  MEDICATIONS: Outpatient Medications Prior to Visit  Medication Sig Dispense Refill   amphetamine-dextroamphetamine (ADDERALL) 20 MG tablet TAKE 1 TABLET BY MOUTH 2 TIMES A DAY 60 tablet 0   Fremanezumab-vfrm (AJOVY) 225 MG/1.5ML SOSY Inject 1 Syringe into the skin every 28 (twenty-eight) days. 3 Syringe 4   natalizumab (TYSABRI) 300 MG/15ML injection Inject into the vein.     tamsulosin (FLOMAX) 0.4 MG CAPS capsule Take 1 capsule (0.4 mg total) by mouth daily. 90 capsule 3   topiramate (TOPAMAX) 100 MG tablet TAKE 1 & 1/2 TABLETS BY MOUTH DAILY 135 tablet 3   Facility-Administered Medications Prior to Visit  Medication Dose Route Frequency Provider Last Rate Last Dose   methylPREDNISolone sodium succinate (SOLU-MEDROL) 1,000 mg in sodium chloride 0.9 % 100 mL IVPB  1,000 mg Intravenous Continuous Davelyn Gwinn A, MD   1,000 mg at 07/11/14 1335   methylPREDNISolone sodium succinate (SOLU-MEDROL) 1,000 mg in sodium chloride 0.9 % 100 mL IVPB  1,000 mg Intravenous Continuous Asa LenteSater, Shlome Baldree A, MD   Stopped at 07/12/14 1530    PAST MEDICAL HISTORY: Past Medical History:  Diagnosis Date   Back pain    Headache    MS (multiple sclerosis) (HCC)    Pseudoseizures    Vision abnormalities     PAST SURGICAL HISTORY: Past Surgical History:  Procedure Laterality Date   BACK SURGERY     CESAREAN SECTION  2012  CHOLECYSTECTOMY, LAPAROSCOPIC  2004   KNEE SURGERY     LUMBAR LAMINECTOMY     VAGINAL HYSTERECTOMY      FAMILY HISTORY: Family History  Problem Relation Age of Onset   Diabetes type II Mother    Hypertension Mother    Fibromyalgia Mother    Cardiomyopathy Father    Heart attack Father     SOCIAL HISTORY:  Social History   Socioeconomic History   Marital status: Married    Spouse name: Not on file   Number of children: Not on file   Years of education: Not on file   Highest education level: Not on file  Occupational History   Not on file    Social Needs   Financial resource strain: Not on file   Food insecurity    Worry: Not on file    Inability: Not on file   Transportation needs    Medical: Not on file    Non-medical: Not on file  Tobacco Use   Smoking status: Former Smoker    Packs/day: 1.00    Types: Cigarettes   Smokeless tobacco: Never Used  Substance and Sexual Activity   Alcohol use: No    Alcohol/week: 0.0 standard drinks   Drug use: No   Sexual activity: Not on file  Lifestyle   Physical activity    Days per week: Not on file    Minutes per session: Not on file   Stress: Not on file  Relationships   Social connections    Talks on phone: Not on file    Gets together: Not on file    Attends religious service: Not on file    Active member of club or organization: Not on file    Attends meetings of clubs or organizations: Not on file    Relationship status: Not on file   Intimate partner violence    Fear of current or ex partner: Not on file    Emotionally abused: Not on file    Physically abused: Not on file    Forced sexual activity: Not on file  Other Topics Concern   Not on file  Social History Narrative   Not on file     PHYSICAL EXAM  Vitals:   10/20/18 1046  BP: 120/76  Pulse: 80  Temp: 98.2 F (36.8 C)  Weight: 250 lb 8 oz (113.6 kg)  Height: 5\' 8"  (1.727 m)    Body mass index is 38.09 kg/m.   General: The patient is well-developed and well-nourished and in no acute distress.  Neck range of motion is normal.  Neurologic Exam  Mental status: The patient is alert and oriented x 3 at the time of the examination.  Normal memory and attention.     Cranial nerves: EOM intact.  There is reduced color vision out of the right eye.  She has a right APD.  Speech is normal.    Normal facial strength and slightly decreased left sensation.   SCM/trapezius are strong.   Hearing is symmetric         Motor:  Muscle bulk and tone are normal. Strength is  5 / 5 in the arms.   Testing the left leg is difficult as she is reporting pain.  She appears to have 4=/5 strength at the left ankle and toe extension  Coordination:   Finger-nose-finger is normal bilaterally.  Left heel-to-shin is reduced.   Sensory: On sensory testing she reports decreased sensation to touch and vibration below  the knee on the left.  Sensation is symmetric in arms       Gait and station: Station is normal. The gait is arthritic and mildly wide.  Tandem gait is poor.  She does not need a cane. . Romberg is negative.  Reflexes: DTRs were mildly asymmetric, increased on the left in the leg..  .     DIAGNOSTIC DATA (LABS, IMAGING, TESTING) - I reviewed patient records, labs, notes, testing and imaging myself where available.     ASSESSMENT AND PLAN 1. Multiple sclerosis ([place])   2. High risk medication use   3. Chronic migraine   4. Optic neuritis   5. Left foot drop   6. Other fatigue     1.    Continue Tysabri.  We will check a JCV antibody today.  Check MRI of the brain to determine if there is any subclinical progression.  If recurring, consider a different disease modifying therapy.   2.   Change Ajovy to Emgality at her insurance company's request.  3.   Continue Adderall for fatigue, clonazepam for sleep/anxiety 4.     Return in 6 months or sooner if there are new or worsening neurologic symptoms.     Quanita Barona A. Felecia Shelling, MD, PhD 08/18/7351, 29:92 PM Certified in Neurology, Clinical Neurophysiology, Sleep Medicine, Pain Medicine and Neuroimaging  [facility] [address], [place], [place] [address] [phone]

## 2018-10-21 LAB — CBC WITH DIFFERENTIAL/PLATELET
Basophils Absolute: 0 10*3/uL (ref 0.0–0.2)
Basos: 0 %
EOS (ABSOLUTE): 0.1 10*3/uL (ref 0.0–0.4)
Eos: 1 %
Hematocrit: 42.2 % (ref 34.0–46.6)
Hemoglobin: 14.2 g/dL (ref 11.1–15.9)
Immature Grans (Abs): 0 10*3/uL (ref 0.0–0.1)
Immature Granulocytes: 0 %
Lymphocytes Absolute: 3.6 10*3/uL — ABNORMAL HIGH (ref 0.7–3.1)
Lymphs: 39 %
MCH: 28.9 pg (ref 26.6–33.0)
MCHC: 33.6 g/dL (ref 31.5–35.7)
MCV: 86 fL (ref 79–97)
Monocytes Absolute: 0.5 10*3/uL (ref 0.1–0.9)
Monocytes: 6 %
Neutrophils Absolute: 5 10*3/uL (ref 1.4–7.0)
Neutrophils: 54 %
Platelets: 286 10*3/uL (ref 150–450)
RBC: 4.91 x10E6/uL (ref 3.77–5.28)
RDW: 13.9 % (ref 11.7–15.4)
WBC: 9.3 10*3/uL (ref 3.4–10.8)

## 2018-10-24 ENCOUNTER — Telehealth: Payer: Self-pay | Admitting: Neurology

## 2018-10-24 MED ORDER — CLONAZEPAM 1 MG PO TABS: ORAL_TABLET | ORAL | 5 refills | Status: DC

## 2018-10-24 NOTE — Telephone Encounter (Signed)
Dr. Felecia Shelling- I reviewed your note. Can you send in rx clonazepam as discussed? Thank you!  PA emgality was approved

## 2018-10-24 NOTE — Telephone Encounter (Signed)
Pt states that her pharmacy informed her that her restless leg medication has not been called in for her. She would also like to know the update on the PA for her Emgality. Please advise.

## 2018-10-24 NOTE — Telephone Encounter (Signed)
Called pt back. Advised Dr. Felecia Shelling called in rx clonazepam. Also advised PA emgality approved. Gave her below information:   CaseId:[id number];Status:Approved;Review Type:Prior Auth;Coverage Start Date:09/20/2018;Coverage End Date:10/20/2019; Pt verbalized understanding.

## 2018-10-24 NOTE — Telephone Encounter (Signed)
I sent in the clonazepam

## 2018-10-26 ENCOUNTER — Telehealth: Payer: Self-pay | Admitting: Neurology

## 2018-10-26 NOTE — Telephone Encounter (Signed)
[employer] medicare/[employer] [place] Auth: NPR spoke to Caitlin [name] Ref # [id number] order sent to GI. They will reach out to the patient to schedule.

## 2018-10-27 NOTE — Telephone Encounter (Signed)
JCV ab drawn on 10/20/18 negative, index: 0.15

## 2018-11-08 ENCOUNTER — Other Ambulatory Visit: Payer: Self-pay | Admitting: Neurology

## 2018-11-18 ENCOUNTER — Other Ambulatory Visit: Payer: Medicare Other

## 2018-12-10 ENCOUNTER — Other Ambulatory Visit: Payer: Self-pay

## 2018-12-10 ENCOUNTER — Ambulatory Visit: Admission: RE | Admit: 2018-12-10 | Payer: Medicare Other | Source: Ambulatory Visit

## 2018-12-13 ENCOUNTER — Other Ambulatory Visit: Payer: Self-pay | Admitting: Neurology

## 2018-12-13 NOTE — Telephone Encounter (Signed)
Called pt. Advised I will send refill request to Dr. Felecia Shelling but I noticed she did not have f/u scheduled yet after she saw Dr. Felecia Shelling on 10/20/18. I scheduled 6 month f/u for 04/24/19 at 11:30am with Dr. Felecia Shelling.   Checked drug registry. She last refilled rx 11/08/18 #60.  She is scheduled for MRI at [facility] imaging on 01/07/19.

## 2018-12-26 ENCOUNTER — Telehealth: Payer: Self-pay | Admitting: *Deleted

## 2018-12-26 NOTE — Telephone Encounter (Signed)
Faxed completed/signed Tysabri pt status report and reauth questionnaire to [facility] at [phone]. Received confirmation.  

## 2019-01-07 ENCOUNTER — Other Ambulatory Visit: Payer: Self-pay

## 2019-01-07 ENCOUNTER — Ambulatory Visit
Admission: RE | Admit: 2019-01-07 | Discharge: 2019-01-07 | Disposition: A | Discharge status: Home / Self Care | Payer: Medicare Other | Source: Ambulatory Visit | Attending: Neurology | Admitting: Neurology

## 2019-01-07 DIAGNOSIS — G35 Multiple sclerosis: Secondary | ICD-10-CM

## 2019-01-09 ENCOUNTER — Telehealth: Payer: Self-pay | Admitting: *Deleted

## 2019-01-09 NOTE — Telephone Encounter (Signed)
-----   Message from Britt Bottom, MD sent at 01/08/2019 10:12 PM EDT ----- Please let her know that the MRI of the brain did not show any new lesions.

## 2019-01-09 NOTE — Telephone Encounter (Signed)
I called and spoke with pt about results per Dr. Sater note. She verbalized understanding. 

## 2019-01-17 ENCOUNTER — Other Ambulatory Visit: Payer: Self-pay | Admitting: Neurology

## 2019-01-18 ENCOUNTER — Telehealth: Payer: Self-pay | Admitting: *Deleted

## 2019-01-18 NOTE — Telephone Encounter (Signed)
Pt needs to be transferred out from intrafusion to different location for Tysabri infusions. I contacted her insurance this am and was informed I needed to print off PA request form and fill out and send in. Case ID: [id number]. CPT: [id number]. There is already a current auth on file, only site of infusion needs to be updated. I filled out form and faxed back to [phone]. Received fax confirmation. Waiting on site to be updated and then will [facility] to be scheduled there next week for infusion. Her last infusion was 12/28/18.

## 2019-01-19 NOTE — Telephone Encounter (Addendum)
I called insurance to check on status of them updating infusion site info. Spoke with May. She states they did receive urgent fax yesterday but they have up to 72 hours to review. They will fax [place] update once completed. Case ID: [id number].   I called pt to give her update about getting her transferred out for Tysabri infusion. She is aware we are waiting on infusion site to be updated by her insurance. Once this is done, we can send orders to patient care center for them to call and get her scheduled. She verbalized understanding. She will call back if she has further questions/concerns.

## 2019-01-24 ENCOUNTER — Telehealth: Payer: Self-pay | Admitting: Neurology

## 2019-01-24 NOTE — Telephone Encounter (Signed)
Called [employer]  at [phone]. Spoke with cloud. Provided Case ID:  He checked status and states it was still pending review. I advised I talked with May 01/18/19 who states since it was marked urgent it should have had a determination/update in 72 hr. He apologized and is going to escalate case since it is still pending. Ref# for call: [id number]. Waiting on determination.

## 2019-01-24 NOTE — Telephone Encounter (Signed)
Pt called wanting to know what the status of her infusion is. Please advise.

## 2019-01-24 NOTE — Telephone Encounter (Signed)
Called Caitlin [name] back. She states she had our wrong fax number. She requested I fax office visit notes to her at [phone]. I faxed and received confirmation. She states she needed these even though there was a current PA on file for infusion and only site needed updating. She will give update today.

## 2019-01-24 NOTE — Telephone Encounter (Signed)
Clinician Amy @ [employer] [place] has returned the call to RN Terrence Dupont, she is asking for a call back at [phone]

## 2019-01-25 NOTE — Telephone Encounter (Signed)
Called and LVM for Amy asking for an update. We did not receive anything yesterday. Provided her fax: [phone] and phone: [phone]. Case number:  [id number].  Advised I did send last OV note as requested and received fax confirmation. Advised this is urgent and would like update asap.

## 2019-01-25 NOTE — Telephone Encounter (Signed)
Received fax notification from [facility] that they have pt location changed to [facility] [place] and Tysabri approved 01/18/19-07/26/19. Case: [id number]. Faxed orders to [facility] for Tysabri at [phone]. Asked them to call pt to schedule infusion. Her last once was 12/28/2018. I called pt and relayed above information.I gave her their phone number to call if needed: [phone]. Pt verbalized understanding.

## 2019-01-26 NOTE — Telephone Encounter (Signed)
Received fax from touch prescribing program that pt authorized for Tysabri 01/25/19-07/29/19. Pt enrollment number: [id number]. Account: [place]. Site auth number: [id number].  [address], [place], [place] [address]

## 2019-01-27 ENCOUNTER — Ambulatory Visit (HOSPITAL_COMMUNITY)
Admission: RE | Admit: 2019-01-27 | Discharge: 2019-01-27 | Disposition: A | Discharge status: Home / Self Care | Payer: BC Managed Care – PPO | Source: Ambulatory Visit | Attending: Internal Medicine | Admitting: Internal Medicine

## 2019-01-27 ENCOUNTER — Other Ambulatory Visit: Payer: Self-pay

## 2019-01-27 DIAGNOSIS — G35 Multiple sclerosis: Secondary | ICD-10-CM | POA: Insufficient documentation

## 2019-01-27 MED ORDER — SODIUM CHLORIDE 0.9 % IV SOLN
300.0000 mg | INTRAVENOUS | Status: DC
  Administered 2019-01-27: 300 mg via INTRAVENOUS
  Filled 2019-01-27: qty 15

## 2019-01-27 MED ORDER — SODIUM CHLORIDE 0.9 % IV SOLN
INTRAVENOUS | Status: DC | PRN
  Administered 2019-01-27: 250 mL via INTRAVENOUS

## 2019-01-27 NOTE — Progress Notes (Signed)
PATIENT CARE CENTER NOTE  Diagnosis:Multiple Sclerosis (G[facility])   Provider:Sater, Richard, MD   Procedure:Tysabri infusion   Note:Patient received Tysabri infusion via PIV. Tolerated well with no adverse reaction. Vital signs stable. Patient did not want to wait for 1 hour post-infusion. Discharge instructions given. Patient alert, oriented and ambulatory at discharge.               

## 2019-01-27 NOTE — Discharge Instructions (Signed)
Natalizumab injection What is this medicine? NATALIZUMAB (na ta LIZ you mab) is used to treat relapsing multiple sclerosis. This drug is not a cure. It is also used to treat Crohn's disease. This medicine may be used for other purposes; ask your health care provider or pharmacist if you have questions. COMMON BRAND NAME(S): Tysabri What should I tell my health care provider before I take this medicine? They need to know if you have any of these conditions:  immune system problems  progressive multifocal leukoencephalopathy (PML)  an unusual or allergic reaction to natalizumab, other medicines, foods, dyes, or preservatives  pregnant or trying to get pregnant  breast-feeding How should I use this medicine? This medicine is for infusion into a vein. It is given by a health care professional in a hospital or clinic setting. A special MedGuide will be given to you by the pharmacist with each prescription and refill. Be sure to read this information carefully each time. Talk to your pediatrician regarding the use of this medicine in children. This medicine is not approved for use in children. Overdosage: If you think you have taken too much of this medicine contact a poison control center or emergency room at once. NOTE: This medicine is only for you. Do not share this medicine with others. What if I miss a dose? It is important not to miss your dose. Call your doctor or health care professional if you are unable to keep an appointment. What may interact with this medicine?  azathioprine  cyclosporine  interferon  6-mercaptopurine  methotrexate  steroid medicines like prednisone or cortisone  TNF-alpha inhibitors like adalimumab, etanercept, and infliximab  vaccines This list may not describe all possible interactions. Give your health care provider a list of all the medicines, herbs, non-prescription drugs, or dietary supplements you use. Also tell them if you smoke, drink  alcohol, or use illegal drugs. Some items may interact with your medicine. What should I watch for while using this medicine? Your condition will be monitored carefully while you are receiving this medicine. Visit your doctor for regular check ups. Tell your doctor or healthcare professional if your symptoms do not start to get better or if they get worse. Stay away from people who are sick. Call your doctor or health care professional for advice if you get a fever, chills or sore throat, or other symptoms of a cold or flu. Do not treat yourself. In some patients, this medicine may cause a serious brain infection that may cause death. If you have any problems seeing, thinking, speaking, walking, or standing, tell your doctor right away. If you cannot reach your doctor, get urgent medical care. What side effects may I notice from receiving this medicine? Side effects that you should report to your doctor or health care professional as soon as possible:  allergic reactions like skin rash, itching or hives, swelling of the face, lips, or tongue  breathing problems  changes in vision  chest pain  dark urine  depression, feelings of sadness  dizziness  general ill feeling or flu-like symptoms  irregular, missed, or painful menstrual periods  light-colored stools  loss of appetite, nausea  muscle weakness  problems with balance, talking, or walking  right upper belly pain  unusually weak or tired  yellowing of the eyes or skin Side effects that usually do not require medical attention (report to your doctor or health care professional if they continue or are bothersome):  aches, pains  headache  stomach upset    tiredness This list may not describe all possible side effects. Call your doctor for medical advice about side effects. You may report side effects to FDA at 1-800-FDA-1088. Where should I keep my medicine? This drug is given in a hospital or clinic and will not be  stored at home. NOTE: This sheet is a summary. It may not cover all possible information. If you have questions about this medicine, talk to your doctor, pharmacist, or health care provider.  2020 Elsevier/Gold Standard (2008-06-16 13:33:21)  

## 2019-02-16 ENCOUNTER — Telehealth: Payer: Self-pay | Admitting: *Deleted

## 2019-02-16 NOTE — Telephone Encounter (Signed)
Called, LVM for Caitlin [name][facility] pt receiving [facility]. Phone: [phone]. Advised we received fax asking for dx code/MD signature for Tysabri orders. Wanted to clarify what was needed before sending back. 

## 2019-02-16 NOTE — Telephone Encounter (Signed)
Dr. Felecia Shelling signed the Tysabri 300 mg infusion order. He indicated the order is good until 02/16/2020. Order faxed back to [facility]. Received a receipt of confirmation.

## 2019-02-16 NOTE — Telephone Encounter (Signed)
Caitlin [name] with [facility] @ [facility] called back. The orders signature/dx code request is part of the process and the previous order does not appear to have been scanned in. Orders have been good only for 90 days but now there is a new form that can be obtained that will allow the order to be current for a year. She stated if chosen, Dr. Sater can put on order form that the order is good for 365 days or expires **/** and then sign.  

## 2019-02-20 ENCOUNTER — Other Ambulatory Visit: Payer: Self-pay | Admitting: Neurology

## 2019-02-24 ENCOUNTER — Encounter (HOSPITAL_COMMUNITY): Payer: Medicare Other

## 2019-02-24 ENCOUNTER — Other Ambulatory Visit: Payer: Self-pay

## 2019-02-24 ENCOUNTER — Ambulatory Visit (HOSPITAL_COMMUNITY)
Admission: RE | Admit: 2019-02-24 | Discharge: 2019-02-24 | Disposition: A | Discharge status: Home / Self Care | Payer: BC Managed Care – PPO | Source: Ambulatory Visit | Attending: Internal Medicine | Admitting: Internal Medicine

## 2019-02-24 DIAGNOSIS — G35 Multiple sclerosis: Secondary | ICD-10-CM | POA: Diagnosis present

## 2019-02-24 MED ORDER — SODIUM CHLORIDE 0.9 % IV SOLN
INTRAVENOUS | Status: DC | PRN
  Administered 2019-02-24: 09:00:00 250 mL via INTRAVENOUS

## 2019-02-24 MED ORDER — SODIUM CHLORIDE 0.9 % IV SOLN
300.0000 mg | Freq: Once | INTRAVENOUS | Status: AC
  Administered 2019-02-24: 09:00:00 300 mg via INTRAVENOUS
  Filled 2019-02-24: qty 15

## 2019-02-24 NOTE — Progress Notes (Signed)
Patient received Tysabri via PIV. Patient declined 1 hour post infusion observation. Tolerated well, vitals stable, discharge instructions given, verbalized understanding. Patient alert, oriented and ambulatory at the time of discharge.

## 2019-02-24 NOTE — Discharge Instructions (Signed)
Natalizumab injection What is this medicine? NATALIZUMAB (na ta LIZ you mab) is used to treat relapsing multiple sclerosis. This drug is not a cure. It is also used to treat Crohn's disease. This medicine may be used for other purposes; ask your health care provider or pharmacist if you have questions. COMMON BRAND NAME(S): Tysabri What should I tell my health care provider before I take this medicine? They need to know if you have any of these conditions:  immune system problems  progressive multifocal leukoencephalopathy (PML)  an unusual or allergic reaction to natalizumab, other medicines, foods, dyes, or preservatives  pregnant or trying to get pregnant  breast-feeding How should I use this medicine? This medicine is for infusion into a vein. It is given by a health care professional in a hospital or clinic setting. A special MedGuide will be given to you by the pharmacist with each prescription and refill. Be sure to read this information carefully each time. Talk to your pediatrician regarding the use of this medicine in children. This medicine is not approved for use in children. Overdosage: If you think you have taken too much of this medicine contact a poison control center or emergency room at once. NOTE: This medicine is only for you. Do not share this medicine with others. What if I miss a dose? It is important not to miss your dose. Call your doctor or health care professional if you are unable to keep an appointment. What may interact with this medicine?  azathioprine  cyclosporine  interferon  6-mercaptopurine  methotrexate  steroid medicines like prednisone or cortisone  TNF-alpha inhibitors like adalimumab, etanercept, and infliximab  vaccines This list may not describe all possible interactions. Give your health care provider a list of all the medicines, herbs, non-prescription drugs, or dietary supplements you use. Also tell them if you smoke, drink  alcohol, or use illegal drugs. Some items may interact with your medicine. What should I watch for while using this medicine? Your condition will be monitored carefully while you are receiving this medicine. Visit your doctor for regular check ups. Tell your doctor or healthcare professional if your symptoms do not start to get better or if they get worse. Stay away from people who are sick. Call your doctor or health care professional for advice if you get a fever, chills or sore throat, or other symptoms of a cold or flu. Do not treat yourself. In some patients, this medicine may cause a serious brain infection that may cause death. If you have any problems seeing, thinking, speaking, walking, or standing, tell your doctor right away. If you cannot reach your doctor, get urgent medical care. What side effects may I notice from receiving this medicine? Side effects that you should report to your doctor or health care professional as soon as possible:  allergic reactions like skin rash, itching or hives, swelling of the face, lips, or tongue  breathing problems  changes in vision  chest pain  dark urine  depression, feelings of sadness  dizziness  general ill feeling or flu-like symptoms  irregular, missed, or painful menstrual periods  light-colored stools  loss of appetite, nausea  muscle weakness  problems with balance, talking, or walking  right upper belly pain  unusually weak or tired  yellowing of the eyes or skin Side effects that usually do not require medical attention (report to your doctor or health care professional if they continue or are bothersome):  aches, pains  headache  stomach upset    tiredness This list may not describe all possible side effects. Call your doctor for medical advice about side effects. You may report side effects to FDA at 1-800-FDA-1088. Where should I keep my medicine? This drug is given in a hospital or clinic and will not be  stored at home. NOTE: This sheet is a summary. It may not cover all possible information. If you have questions about this medicine, talk to your doctor, pharmacist, or health care provider.  2020 Elsevier/Gold Standard (2008-06-16 13:33:21)  

## 2019-03-22 ENCOUNTER — Other Ambulatory Visit: Payer: Self-pay | Admitting: Neurology

## 2019-03-24 ENCOUNTER — Other Ambulatory Visit: Payer: Self-pay

## 2019-03-24 ENCOUNTER — Ambulatory Visit (HOSPITAL_COMMUNITY)
Admission: RE | Admit: 2019-03-24 | Discharge: 2019-03-24 | Disposition: A | Discharge status: Home / Self Care | Payer: BC Managed Care – PPO | Source: Ambulatory Visit | Attending: Internal Medicine | Admitting: Internal Medicine

## 2019-03-24 DIAGNOSIS — G35 Multiple sclerosis: Secondary | ICD-10-CM | POA: Diagnosis present

## 2019-03-24 MED ORDER — SODIUM CHLORIDE 0.9 % IV SOLN
INTRAVENOUS | Status: DC | PRN
  Administered 2019-03-24: 250 mL via INTRAVENOUS

## 2019-03-24 MED ORDER — SODIUM CHLORIDE 0.9 % IV SOLN
300.0000 mg | INTRAVENOUS | Status: DC
  Administered 2019-03-24: 300 mg via INTRAVENOUS
  Filled 2019-03-24: qty 15

## 2019-03-24 NOTE — Progress Notes (Signed)
PATIENT CARE CENTER NOTE  Diagnosis:Multiple Sclerosis (G[facility])   Provider:Sater, Richard, MD   Procedure:Tysabri infusion   Note:Patient received Tysabri infusion via PIV. Tolerated well with no adverse reaction. Vital signs stable. Patient did not want to wait for 1 hour post-infusion. Discharge instructions given. Patient alert, oriented and ambulatory at discharge.               

## 2019-03-24 NOTE — Discharge Instructions (Signed)
Natalizumab injection What is this medicine? NATALIZUMAB (na ta LIZ you mab) is used to treat relapsing multiple sclerosis. This drug is not a cure. It is also used to treat Crohn's disease. This medicine may be used for other purposes; ask your health care provider or pharmacist if you have questions. COMMON BRAND NAME(S): Tysabri What should I tell my health care provider before I take this medicine? They need to know if you have any of these conditions:  immune system problems  progressive multifocal leukoencephalopathy (PML)  an unusual or allergic reaction to natalizumab, other medicines, foods, dyes, or preservatives  pregnant or trying to get pregnant  breast-feeding How should I use this medicine? This medicine is for infusion into a vein. It is given by a health care professional in a hospital or clinic setting. A special MedGuide will be given to you by the pharmacist with each prescription and refill. Be sure to read this information carefully each time. Talk to your pediatrician regarding the use of this medicine in children. This medicine is not approved for use in children. Overdosage: If you think you have taken too much of this medicine contact a poison control center or emergency room at once. NOTE: This medicine is only for you. Do not share this medicine with others. What if I miss a dose? It is important not to miss your dose. Call your doctor or health care professional if you are unable to keep an appointment. What may interact with this medicine?  azathioprine  cyclosporine  interferon  6-mercaptopurine  methotrexate  steroid medicines like prednisone or cortisone  TNF-alpha inhibitors like adalimumab, etanercept, and infliximab  vaccines This list may not describe all possible interactions. Give your health care provider a list of all the medicines, herbs, non-prescription drugs, or dietary supplements you use. Also tell them if you smoke, drink  alcohol, or use illegal drugs. Some items may interact with your medicine. What should I watch for while using this medicine? Your condition will be monitored carefully while you are receiving this medicine. Visit your doctor for regular check ups. Tell your doctor or healthcare professional if your symptoms do not start to get better or if they get worse. Stay away from people who are sick. Call your doctor or health care professional for advice if you get a fever, chills or sore throat, or other symptoms of a cold or flu. Do not treat yourself. In some patients, this medicine may cause a serious brain infection that may cause death. If you have any problems seeing, thinking, speaking, walking, or standing, tell your doctor right away. If you cannot reach your doctor, get urgent medical care. What side effects may I notice from receiving this medicine? Side effects that you should report to your doctor or health care professional as soon as possible:  allergic reactions like skin rash, itching or hives, swelling of the face, lips, or tongue  breathing problems  changes in vision  chest pain  dark urine  depression, feelings of sadness  dizziness  general ill feeling or flu-like symptoms  irregular, missed, or painful menstrual periods  light-colored stools  loss of appetite, nausea  muscle weakness  problems with balance, talking, or walking  right upper belly pain  unusually weak or tired  yellowing of the eyes or skin Side effects that usually do not require medical attention (report to your doctor or health care professional if they continue or are bothersome):  aches, pains  headache  stomach upset    tiredness This list may not describe all possible side effects. Call your doctor for medical advice about side effects. You may report side effects to FDA at 1-800-FDA-1088. Where should I keep my medicine? This drug is given in a hospital or clinic and will not be  stored at home. NOTE: This sheet is a summary. It may not cover all possible information. If you have questions about this medicine, talk to your doctor, pharmacist, or health care provider.  2020 Elsevier/Gold Standard (2008-06-16 13:33:21)  

## 2019-04-21 ENCOUNTER — Other Ambulatory Visit: Payer: Self-pay

## 2019-04-21 ENCOUNTER — Ambulatory Visit (HOSPITAL_COMMUNITY)
Admission: RE | Admit: 2019-04-21 | Discharge: 2019-04-21 | Disposition: A | Discharge status: Home / Self Care | Payer: BC Managed Care – PPO | Source: Ambulatory Visit | Attending: Internal Medicine | Admitting: Internal Medicine

## 2019-04-21 DIAGNOSIS — G35 Multiple sclerosis: Secondary | ICD-10-CM | POA: Insufficient documentation

## 2019-04-21 MED ORDER — SODIUM CHLORIDE 0.9 % IV SOLN
INTRAVENOUS | Status: DC | PRN
  Administered 2019-04-21: 250 mL via INTRAVENOUS

## 2019-04-21 MED ORDER — SODIUM CHLORIDE 0.9 % IV SOLN
300.0000 mg | INTRAVENOUS | Status: DC
  Administered 2019-04-21: 09:00:00 300 mg via INTRAVENOUS
  Filled 2019-04-21: qty 15

## 2019-04-21 NOTE — Progress Notes (Signed)
PATIENT CARE CENTER NOTE  Diagnosis:Multiple Sclerosis (G[facility])   Provider:Sater, Richard, MD   Procedure:Tysabri infusion   Note:Patient received Tysabri infusion via PIV. Tolerated well with no adverse reaction. Vital signs stable. Patient did not want to wait for 1 hour post-infusion. Discharge instructions given. Patient alert, oriented and ambulatory at discharge.               

## 2019-04-21 NOTE — Discharge Instructions (Signed)
Natalizumab injection What is this medicine? NATALIZUMAB (na ta LIZ you mab) is used to treat relapsing multiple sclerosis. This drug is not a cure. It is also used to treat Crohn's disease. This medicine may be used for other purposes; ask your health care provider or pharmacist if you have questions. COMMON BRAND NAME(S): Tysabri What should I tell my health care provider before I take this medicine? They need to know if you have any of these conditions:  immune system problems  progressive multifocal leukoencephalopathy (PML)  an unusual or allergic reaction to natalizumab, other medicines, foods, dyes, or preservatives  pregnant or trying to get pregnant  breast-feeding How should I use this medicine? This medicine is for infusion into a vein. It is given by a health care professional in a hospital or clinic setting. A special MedGuide will be given to you by the pharmacist with each prescription and refill. Be sure to read this information carefully each time. Talk to your pediatrician regarding the use of this medicine in children. This medicine is not approved for use in children. Overdosage: If you think you have taken too much of this medicine contact a poison control center or emergency room at once. NOTE: This medicine is only for you. Do not share this medicine with others. What if I miss a dose? It is important not to miss your dose. Call your doctor or health care professional if you are unable to keep an appointment. What may interact with this medicine?  azathioprine  cyclosporine  interferon  6-mercaptopurine  methotrexate  steroid medicines like prednisone or cortisone  TNF-alpha inhibitors like adalimumab, etanercept, and infliximab  vaccines This list may not describe all possible interactions. Give your health care provider a list of all the medicines, herbs, non-prescription drugs, or dietary supplements you use. Also tell them if you smoke, drink  alcohol, or use illegal drugs. Some items may interact with your medicine. What should I watch for while using this medicine? Your condition will be monitored carefully while you are receiving this medicine. Visit your doctor for regular check ups. Tell your doctor or healthcare professional if your symptoms do not start to get better or if they get worse. Stay away from people who are sick. Call your doctor or health care professional for advice if you get a fever, chills or sore throat, or other symptoms of a cold or flu. Do not treat yourself. In some patients, this medicine may cause a serious brain infection that may cause death. If you have any problems seeing, thinking, speaking, walking, or standing, tell your doctor right away. If you cannot reach your doctor, get urgent medical care. What side effects may I notice from receiving this medicine? Side effects that you should report to your doctor or health care professional as soon as possible:  allergic reactions like skin rash, itching or hives, swelling of the face, lips, or tongue  breathing problems  changes in vision  chest pain  dark urine  depression, feelings of sadness  dizziness  general ill feeling or flu-like symptoms  irregular, missed, or painful menstrual periods  light-colored stools  loss of appetite, nausea  muscle weakness  problems with balance, talking, or walking  right upper belly pain  unusually weak or tired  yellowing of the eyes or skin Side effects that usually do not require medical attention (report to your doctor or health care professional if they continue or are bothersome):  aches, pains  headache  stomach upset    tiredness This list may not describe all possible side effects. Call your doctor for medical advice about side effects. You may report side effects to FDA at 1-800-FDA-1088. Where should I keep my medicine? This drug is given in a hospital or clinic and will not be  stored at home. NOTE: This sheet is a summary. It may not cover all possible information. If you have questions about this medicine, talk to your doctor, pharmacist, or health care provider.  2020 Elsevier/Gold Standard (2008-06-16 13:33:21)  

## 2019-04-24 ENCOUNTER — Telehealth: Payer: Self-pay | Admitting: *Deleted

## 2019-04-24 ENCOUNTER — Ambulatory Visit (INDEPENDENT_AMBULATORY_CARE_PROVIDER_SITE_OTHER): Payer: BC Managed Care – PPO | Admitting: Neurology

## 2019-04-24 ENCOUNTER — Encounter: Payer: Self-pay | Admitting: Neurology

## 2019-04-24 ENCOUNTER — Other Ambulatory Visit: Payer: Self-pay

## 2019-04-24 VITALS — BP 125/70 | HR 61 | Temp 97.6°F | Ht 68.0 in | Wt 230.5 lb

## 2019-04-24 DIAGNOSIS — R269 Unspecified abnormalities of gait and mobility: Secondary | ICD-10-CM | POA: Diagnosis not present

## 2019-04-24 DIAGNOSIS — G35 Multiple sclerosis: Secondary | ICD-10-CM

## 2019-04-24 DIAGNOSIS — Z79899 Other long term (current) drug therapy: Secondary | ICD-10-CM | POA: Diagnosis not present

## 2019-04-24 DIAGNOSIS — G43709 Chronic migraine without aura, not intractable, without status migrainosus: Secondary | ICD-10-CM

## 2019-04-24 DIAGNOSIS — R5383 Other fatigue: Secondary | ICD-10-CM

## 2019-04-24 DIAGNOSIS — IMO0002 Reserved for concepts with insufficient information to code with codable children: Secondary | ICD-10-CM

## 2019-04-24 MED ORDER — CLONAZEPAM 1 MG PO TABS: ORAL_TABLET | ORAL | 5 refills | Status: DC

## 2019-04-24 MED ORDER — AMPHETAMINE-DEXTROAMPHETAMINE 20 MG PO TABS: 20.0000 mg | ORAL_TABLET | Freq: Two times a day (BID) | ORAL | 0 refills | Status: DC

## 2019-04-24 MED ORDER — TOPIRAMATE 100 MG PO TABS: ORAL_TABLET | ORAL | 3 refills | Status: DC

## 2019-04-24 NOTE — Telephone Encounter (Signed)
Placed [facility] lab in [facility] lock box for routine lab pick up. Results pending. 

## 2019-04-24 NOTE — Progress Notes (Signed)
[facility]  PATIENT: Caitlin [name] DOB: 12/16/1985  REFERRING CLINICIAN: Fredderick Severance  HISTORY FROM: patient REASON FOR VISIT: MS   HISTORICAL  CHIEF COMPLAINT:  Chief Complaint  Patient presents with  . Follow-up    RM 12, alone. Last seen 10/20/2018.  . Multiple Sclerosis    On Tysabri. Last JCV negative, index: 0.15 drawn 10/20/18. DUE FOR LABS TODAY  . Migraine    On emgality inj monthly    HISTORY OF PRESENT ILLNESS:  Caitlin [name] is a 33 y.o. 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      Update 04/24/2019: She feels her MS has been stable.   She is on Tysabri and tolerates it well.     She denies any new numbness, weakness or gait issues.   No falls.   Bladder function is doing ok and she stopped the flomax as it did not help much.   No recent UTI.  Vision is stable now though she had some OD blurriness earlier this year.       She reports fatigue, a little less since becoming more vegan with diet.   Adderall has helped.  She has had SI and lumbar inflammatory changes and has had injections with benefit.      Sleep is much better and RLS has improved since starting clonazepam at bedtime.      Migraines improved with Ajovy but due to insurance she needed to switch to [employer]   She had one bad migraine and took an NSAID.    Triptans had not helped.   Laying in a dark room helped also.      PDMP was reviewed and she has good compliance and only gets the medications form [place].    Update 10/20/2018: She feels her MS is mostly stable though there was some blurry vision OD for about 4 days about 10 days ago.      The blurriness was worse the first 2 days then improved.   She had ON in the past but current episode.  Her gait is doing well.  No recent falls.   Strength and sensation are fine.   She has had more UTIs the last few months and has retention.   Flomax had not helped.     She has fatigue daily.   Sleep is variable.    Mornings are better  than afternoons.  She has restless legs at night causing insomnia some nights.  She has RLS some days as well.   Moving around helps.   Gabapentin did not help.  Mood is doing better.     Adderall helps the fatigue and focus.     Her migraines were doing well on Ajovy.  She has had only 2 migraines in 6 months and some milder ones.  Insurance now prefers [employer].      She has not had any more spells of altered consciousness.    Update 04/20/2018: She is on Tysabri as her disease modifying therapy.  She tolerates it well.  JCV antibody 6 - 7 months ago was negative at 0.17.  She has never had a positive JCV antibody.  She denies any exacerbations.   Her gait is doing ok.  She has no recent falls.   She tires out easily while walking.    Strength/sensation are the same.   She has more urinary urgency and has had some incontinence.  She has hesitancy and frequent UTI's.   She does not think she has  a UTI.    She stopped tamsulosin.   She did not think it helped much.  She has fatigue in the afternoons and some cognitive fog.   Adderall has helped.     MRI's have shown mild number of small brain lesions but she did have optic neuritis in the past.   She has not had any more spells.  She is off all anticonvulsants now.  Migraines are much better on Ajovy.   She tolerates it well.   She has had only 2 migraines and several regular headaches the last 3 months.  She is on Topamax also.  Her lower back is hurting and radiates down her leg at times.      Update 10/07/2017: She feels at the MS has been mostly stable.  She is on Tysabri and she tolerates it well.  Her last JCV antibody checked 7 months ago was negative at 0.13.  There have not been any exacerbations.  She does not note any changes in her gait, strength or sensation.  She continues to have some difficulty with balance and no longer uses a cane.  No recent falls.   She has tringling in her left hand and arm ut gabapentin had not helped.     She  has some urinary frequency and urgency but no incontinence.  She has had some hesitancy in the past and was on Flomax helps her empty betterr.  She has had one recent urinary tract infection.  Vision is doing well.  She reports difficulty with insomnia despite taking clonazepam at night.  She tried trazodone in addition but that did not help.  She has headaches daily (30/30) and migraines > 15 days/month for > 4 hours.   Many wake her up in the early morning.   The are usually on her right.  Often she has right neck pain.    These occur despite being on Topamax as a prophylactic agent.  She takes Relpax but the benefit was minimal so she stopped    Update 04/08/2017:   Her MS has been stable on Tysabri.   She denies any change in gait, strength or sensation.   Balance improved with vestibular therapy.   She wears a left AFO brace for longer distances.    Bladder seems stable with some urgency.  She takes Flomax if any hesitancy (rare now) but has had a few UTI's.  Vision is fine.    She still has had a few spells with the shaking spells (last one 3 weeks ago and prior one three weeks earlier).    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      Balance is better with Rehab at the [facility]    Migraines have been much better on Topamax.   However, she has had more the past month.    If one occurs she takes Relpax.   About 3-4 weeks ago, a daily right sided occipital headache has occurred.  She wakes up with this headache.   It has intensified into a migraine every couple days the past 2-3 weeks  From 12/01/2016:  MS:   She is on Tysabri and tolerates it well. JCV antibodies 09/27/2016 were indeterminate (0.2 to 0.4). She reports weakness in the left leg. Despite a lot of symptoms, she has a very low plaque burden on the brain MRI and no lesions have been seen in her spine.   MS was diagnosed after an episode of  optic neuritis and having a couple small non-specific foci on  brain MRI.    A couple years earlier she had a possible transverse myelitis (vs mild GBS).        MRI of the cervical spine 03/02/2016 was essentially normal. There was no evidence of cervical myelopathy and just disc bulge at 2 levels.  The MRI of the brain 06/01/2016 is unchanged compared to the MRI of the brain 01/31/2016 and showed no acute findings.     Pain:  She reports being in a lot more pain and is sitting in a wheelchair today.   Her worse pain is in her hands and legs and neck into her shoulders.   She does not feel she is getting any benefit from her medications.   She had lumbar surgery at L5S1 April 2017.     PseudoseizuresShe went to the [facility] ED 09/09/16 and was felt to be having pseudoseizures and was d/c home.   Earlier in the year, she was hospitalized and had EEG during one of the spells c/w pseudoseizures.    Of note, she is on lamotrigine and Topamax for pain and psych issues.     Mood:  She has anxiety and depression. She has pseudoseizures.   She is on Cymbalta (10 mg at bedtime) without much benefit.   She was seen once by psychiatry in the hospital.   She sees [place] [facility] every 2-3 weeks.   .     Gait/strength/numbness:  She reports poor balance and drags the left foot. She reports falling several times.    She notes weakness in the left foot and she feels there ia also mild weakness of the left hand.     She is doing therapy twice weekly  Bladder/bowel: She feels bowel and bladder are doing well.  Vision:   She had right optic neuritis.   Acuity improved but bright lights bother that eye.   Colors are mildly desaturated out of right eye.    She notes reduced right peripheral vision.      Fatigue/sleep:  She reports a lot of fatigue despite 20 mg x 2 Adderall daily.     She falls asleep well with clonazepam 1 mg and trazodone 100 mg but still has sleep maintenance issues.      REVIEW OF SYSTEMS:  Constitutional: No fevers, chills, sweats, or change in  appetite.   She has fatigue and insomnia Eyes: as above Ear, nose and throat: No hearing loss, ear pain, nasal congestion, sore throat Cardiovascular: No chest pain, palpitations Respiratory:  No shortness of breath at rest or with exertion.   No wheezes GastrointestinaI: No nausea, vomiting, diarrhea, abdominal pain, fecal incontinence Genitourinary:  She reports hesitancy.     She notes nocturia. Musculoskeletal:  No neck pain, some back pain Integumentary: No rash, pruritus, skin lesions Neurological: as above Psychiatric: Some depression at this time.  Some anxiety Endocrine: No palpitations, diaphoresis, change in appetite, change in weigh or increased thirst  ALLERGIES: Allergies  Allergen Reactions  . Ciprofloxacin Anaphylaxis  . Influenza Vaccines Other (See Comments)    Hx of Guillain Barre    HOME MEDICATIONS: Outpatient Medications Prior to Visit  Medication Sig Dispense Refill  . Galcanezumab-gnlm (EMGALITY) 120 MG/ML SOAJ Inject 1 Syringe into the skin every 30 (thirty) days. 3 pen 4  . natalizumab (TYSABRI) 300 MG/15ML injection Inject into the vein.    . tamsulosin (FLOMAX) 0.4 MG CAPS capsule Take 1 capsule (0.4  mg total) by mouth daily. 90 capsule 3  . amphetamine-dextroamphetamine (ADDERALL) 20 MG tablet TAKE 1 TABLET BY MOUTH 2 TIMES A DAY 60 tablet 0  . clonazePAM (KLONOPIN) 1 MG tablet 1/2 to 1 pill po qHS 30 tablet 5  . topiramate (TOPAMAX) 100 MG tablet TAKE 1 & 1/2 TABLETS BY MOUTH DAILY 135 tablet 3  . Fremanezumab-vfrm (AJOVY) 225 MG/1.5ML SOSY Inject 1 Syringe into the skin every 28 (twenty-eight) days. 3 Syringe 4   Facility-Administered Medications Prior to Visit  Medication Dose Route Frequency Provider Last Rate Last Admin  . methylPREDNISolone sodium succinate (SOLU-MEDROL) 1,000 mg in sodium chloride 0.9 % 100 mL IVPB  1,000 mg Intravenous Continuous Brennley Curtice A, MD   1,000 mg at 07/11/14 1335  . methylPREDNISolone sodium succinate  (SOLU-MEDROL) 1,000 mg in sodium chloride 0.9 % 100 mL IVPB  1,000 mg Intravenous Continuous Asa LenteSater, Dugan Vanhoesen A, MD   Stopped at 07/12/14 1530    PAST MEDICAL HISTORY: Past Medical History:  Diagnosis Date  . Back pain   . Headache   . MS (multiple sclerosis) (HCC)   . Pseudoseizures   . Vision abnormalities     PAST SURGICAL HISTORY: Past Surgical History:  Procedure Laterality Date  . BACK SURGERY    . CESAREAN SECTION  2012  . CHOLECYSTECTOMY, LAPAROSCOPIC  2004  . KNEE SURGERY    . LUMBAR LAMINECTOMY    . VAGINAL HYSTERECTOMY      FAMILY HISTORY: Family History  Problem Relation Age of Onset  . Diabetes type II Mother   . Hypertension Mother   . Fibromyalgia Mother   . Cardiomyopathy Father   . Heart attack Father     SOCIAL HISTORY:  Social History   Socioeconomic History  . Marital status: Married    Spouse name: Not on file  . Number of children: Not on file  . Years of education: Not on file  . Highest education level: Not on file  Occupational History  . Not on file  Tobacco Use  . Smoking status: Former Smoker    Packs/day: 1.00    Types: Cigarettes  . Smokeless tobacco: Never Used  Substance and Sexual Activity  . Alcohol use: No    Alcohol/week: 0.0 standard drinks  . Drug use: No  . Sexual activity: Not on file  Other Topics Concern  . Not on file  Social History Narrative  . Not on file   Social Determinants of Health   Financial Resource Strain:   . Difficulty of Paying Living Expenses: Not on file  Food Insecurity:   . Worried About Programme researcher, broadcasting/film/videounning Out of Food in the Last Year: Not on file  . Ran Out of Food in the Last Year: Not on file  Transportation Needs:   . Lack of Transportation (Medical): Not on file  . Lack of Transportation (Non-Medical): Not on file  Physical Activity:   . Days of Exercise per Week: Not on file  . Minutes of Exercise per Session: Not on file  Stress:   . Feeling of Stress : Not on file  Social Connections:    . Frequency of Communication with Friends and Family: Not on file  . Frequency of Social Gatherings with Friends and Family: Not on file  . Attends Religious Services: Not on file  . Active Member of Clubs or Organizations: Not on file  . Attends BankerClub or Organization Meetings: Not on file  . Marital Status: Not on file  Intimate Partner Violence:   .  Fear of Current or Ex-Partner: Not on file  . Emotionally Abused: Not on file  . Physically Abused: Not on file  . Sexually Abused: Not on file     PHYSICAL EXAM  Vitals:   04/24/19 1120  BP: 125/70  Pulse: 61  Temp: 97.6 F (36.4 C)  SpO2: 98%  Weight: 230 lb 8 oz (104.6 kg)  Height: 5\' 8"  (1.727 m)    Body mass index is 35.05 kg/m.   General: The patient is well-developed and well-nourished and in no acute distress.  Neck range of motion is normal.  Neurologic Exam  Mental status: The patient is alert and oriented x 3 at the time of the examination.  Normal memory and attention.     Cranial nerves: EOM intact.  There is reduced color vision out of the right eye.     Speech is normal.    Normal facial strength and slightly decreased left sensation.   SCM/trapezius are strong.   Hearing is symmetric         Motor:  Muscle bulk and tone are normal. Strength is  5 / 5 in the arms.  Strength was 5/5 except for 4+/5 left toe, foot and ankle extension.  Coordination:   Finger-nose-finger is normal bilaterally.  Left heel-to-shin is mildly reduced.   Sensory: On sensory testing she reports decreased sensation to touch and vibration in the left leg.  Sensation is symmetric in arms       Gait and station: Station is normal. The gait is arthritic and mildly wide.  Tandem gait is wide.   Romberg is negative.  Reflexes: DTRs were mildly asymmetric, increased on her left..  .     DIAGNOSTIC DATA (LABS, IMAGING, TESTING) - I reviewed patient records, labs, notes, testing and imaging myself where available.     ASSESSMENT AND  PLAN 1. Multiple sclerosis (HCC)   2. High risk medication use   3. Chronic migraine   4. Gait disturbance   5. Other fatigue     1.    Continue Tysabri.  Check CBC and JCV today 2.   Continue Emgality for chronic migraines  3.   Continue Adderall for fatigue, clonazepam for sleep/anxiety 4.     Return in 6 months or sooner if there are new or worsening neurologic symptoms.     Nickolaos Brallier A. Epimenio FootSater, MD, PhD 04/24/2019, 4:46 PM Certified in Neurology, Clinical Neurophysiology, Sleep Medicine, Pain Medicine and Neuroimaging  [facility] [address], [address] [place], [place] [address] [phone]

## 2019-04-25 LAB — CBC WITH DIFFERENTIAL/PLATELET
Basophils Absolute: 0 10*3/uL (ref 0.0–0.2)
Basos: 0 %
EOS (ABSOLUTE): 0.1 10*3/uL (ref 0.0–0.4)
Eos: 1 %
Hematocrit: 41 % (ref 34.0–46.6)
Hemoglobin: 13.4 g/dL (ref 11.1–15.9)
Immature Grans (Abs): 0 10*3/uL (ref 0.0–0.1)
Immature Granulocytes: 0 %
Lymphocytes Absolute: 3.3 10*3/uL — ABNORMAL HIGH (ref 0.7–3.1)
Lymphs: 45 %
MCH: 28.8 pg (ref 26.6–33.0)
MCHC: 32.7 g/dL (ref 31.5–35.7)
MCV: 88 fL (ref 79–97)
Monocytes Absolute: 0.4 10*3/uL (ref 0.1–0.9)
Monocytes: 6 %
Neutrophils Absolute: 3.5 10*3/uL (ref 1.4–7.0)
Neutrophils: 48 %
Platelets: 226 10*3/uL (ref 150–450)
RBC: 4.65 x10E6/uL (ref 3.77–5.28)
RDW: 14.1 % (ref 11.7–15.4)
WBC: 7.4 10*3/uL (ref 3.4–10.8)

## 2019-05-03 NOTE — Telephone Encounter (Signed)
Called [facility] and requested results be faxed to our office.

## 2019-05-08 ENCOUNTER — Other Ambulatory Visit: Payer: Self-pay | Admitting: Neurology

## 2019-05-08 NOTE — Telephone Encounter (Signed)
Checked drug registry. She last refilled rx 04/03/2019 #30. Last seen 04/24/19 and next f/u 10/23/19

## 2019-05-08 NOTE — Telephone Encounter (Signed)
JCV ab collected on 04/24/19 negative, index: 0.18

## 2019-05-19 ENCOUNTER — Other Ambulatory Visit: Payer: Self-pay

## 2019-05-19 ENCOUNTER — Ambulatory Visit (HOSPITAL_COMMUNITY)
Admission: RE | Admit: 2019-05-19 | Discharge: 2019-05-19 | Disposition: A | Discharge status: Home / Self Care | Payer: BC Managed Care – PPO | Source: Ambulatory Visit | Attending: Internal Medicine | Admitting: Internal Medicine

## 2019-05-19 DIAGNOSIS — G35 Multiple sclerosis: Secondary | ICD-10-CM | POA: Insufficient documentation

## 2019-05-19 MED ORDER — SODIUM CHLORIDE 0.9 % IV SOLN
300.0000 mg | INTRAVENOUS | Status: DC
  Administered 2019-05-19: 300 mg via INTRAVENOUS
  Filled 2019-05-19: qty 15

## 2019-05-19 MED ORDER — SODIUM CHLORIDE 0.9 % IV SOLN
INTRAVENOUS | Status: DC | PRN
  Administered 2019-05-19: 250 mL via INTRAVENOUS

## 2019-05-19 NOTE — Discharge Instructions (Signed)
Natalizumab injection What is this medicine? NATALIZUMAB (na ta LIZ you mab) is used to treat relapsing multiple sclerosis. This drug is not a cure. It is also used to treat Crohn's disease. This medicine may be used for other purposes; ask your health care provider or pharmacist if you have questions. COMMON BRAND NAME(S): Tysabri What should I tell my health care provider before I take this medicine? They need to know if you have any of these conditions:  immune system problems  progressive multifocal leukoencephalopathy (PML)  an unusual or allergic reaction to natalizumab, other medicines, foods, dyes, or preservatives  pregnant or trying to get pregnant  breast-feeding How should I use this medicine? This medicine is for infusion into a vein. It is given by a health care professional in a hospital or clinic setting. A special MedGuide will be given to you by the pharmacist with each prescription and refill. Be sure to read this information carefully each time. Talk to your pediatrician regarding the use of this medicine in children. This medicine is not approved for use in children. Overdosage: If you think you have taken too much of this medicine contact a poison control center or emergency room at once. NOTE: This medicine is only for you. Do not share this medicine with others. What if I miss a dose? It is important not to miss your dose. Call your doctor or health care professional if you are unable to keep an appointment. What may interact with this medicine? Do not take this medicine with any of the following medications:  biologic medicines such as adalimumab, certolizumab, etanercept, golimumab, infliximab This medicine may also interact with the following medications:  azathioprine  cyclosporine  interferons  6-mercaptopurine  methotrexate  other medicines that lower your chance of fighting an infection  steroid medicines like prednisone or  cortisone  vaccines This list may not describe all possible interactions. Give your health care provider a list of all the medicines, herbs, non-prescription drugs, or dietary supplements you use. Also tell them if you smoke, drink alcohol, or use illegal drugs. Some items may interact with your medicine. What should I watch for while using this medicine? Your condition will be monitored carefully while you are receiving this medicine. Visit your doctor for regular check ups. Tell your doctor or healthcare professional if your symptoms do not start to get better or if they get worse. Stay away from people who are sick. Call your doctor or health care professional for advice if you get a fever, chills or sore throat, or other symptoms of a cold or flu. Do not treat yourself. In some patients, this medicine may cause a serious brain infection that may cause death. If you have any problems seeing, thinking, speaking, walking, or standing, tell your doctor right away. If you cannot reach your doctor, get urgent medical care. What side effects may I notice from receiving this medicine? Side effects that you should report to your doctor or health care professional as soon as possible:  allergic reactions like skin rash, itching or hives, swelling of the face, lips, or tongue  breathing problems  changes in vision  chest pain  confusion  depressed mood  dizziness  feeling faint; lightheaded; falls  general ill feeling or flu-like symptoms  loss of memory  missed menstrual periods  muscle weakness  problems with balance, talking, or walking  signs and symptoms of liver injury like dark yellow or brown urine; general ill feeling or flu-like symptoms; light-colored   stools; loss of appetite; nausea; right upper belly pain; unusually weak or tired; yellowing of the eyes or skin  suicidal thoughts, mood changes  unusual bruising or bleeding  unusually weak or tired Side effects that  usually do not require medical attention (report to your doctor or health care professional if they continue or are bothersome):  headache  joint pain  muscle cramps  muscle pain  nausea, vomiting  pain, redness, or irritation at site where injected  tiredness This list may not describe all possible side effects. Call your doctor for medical advice about side effects. You may report side effects to FDA at 1-800-FDA-1088. Where should I keep my medicine? This drug is given in a hospital or clinic and will not be stored at home. NOTE: This sheet is a summary. It may not cover all possible information. If you have questions about this medicine, talk to your doctor, pharmacist, or health care provider.  2020 Elsevier/Gold Standard (2018-10-31 13:20:26)  

## 2019-05-19 NOTE — Progress Notes (Signed)
PATIENT CARE CENTER NOTE  Diagnosis:Multiple Sclerosis (G35)   Provider:Sater, Richard, MD   Procedure:Tysabri infusion   Note:Patient received Tysabri infusion via PIV. Tolerated well with no adverse reaction. Vital signs stable. Patient did not want to wait for 1 hour post-infusion. Discharge instructions given. Patient alert, oriented and ambulatory at discharge.

## 2019-05-22 ENCOUNTER — Other Ambulatory Visit: Payer: Self-pay | Admitting: Neurology

## 2019-06-05 ENCOUNTER — Telehealth: Payer: Self-pay | Admitting: Neurology

## 2019-06-05 NOTE — Telephone Encounter (Signed)
Patient is requesting a call back to discuss what she can do regarding her infusion she states her primary insurance will be termed the end of January and her Medicare will only cover about 80% of it

## 2019-06-05 NOTE — Telephone Encounter (Signed)
Called pt back. I advised her to call [employer] [phone]  to see if any assistance. She is going to try calling again, she could not get through before. If she cannot get assistance she will let me know. Husband insurance will kick in in 90 days, he is handing in new hire info to work tomorrow.

## 2019-06-16 ENCOUNTER — Other Ambulatory Visit: Payer: Self-pay

## 2019-06-16 ENCOUNTER — Ambulatory Visit (HOSPITAL_COMMUNITY)
Admission: RE | Admit: 2019-06-16 | Discharge: 2019-06-16 | Disposition: A | Discharge status: Home / Self Care | Payer: Medicare Other | Source: Ambulatory Visit | Attending: Internal Medicine | Admitting: Internal Medicine

## 2019-06-16 DIAGNOSIS — G35 Multiple sclerosis: Secondary | ICD-10-CM | POA: Diagnosis not present

## 2019-06-16 MED ORDER — SODIUM CHLORIDE 0.9 % IV SOLN
INTRAVENOUS | Status: DC | PRN
  Administered 2019-06-16: 250 mL via INTRAVENOUS

## 2019-06-16 MED ORDER — SODIUM CHLORIDE 0.9 % IV SOLN
300.0000 mg | INTRAVENOUS | Status: DC
  Administered 2019-06-16: 300 mg via INTRAVENOUS
  Filled 2019-06-16: qty 15

## 2019-06-16 NOTE — Progress Notes (Signed)
PATIENT CARE CENTER NOTE  Diagnosis:Multiple Sclerosis (G[facility])   Provider:Sater, Richard, MD   Procedure:Tysabri infusion   Note:Patient received Tysabri infusion via PIV. Tolerated well with no adverse reaction. Vital signs stable. Patient did not want to wait for 1 hour post-infusion. Discharge instructions given. Patient alert, oriented and ambulatory at discharge.               

## 2019-06-16 NOTE — Discharge Instructions (Signed)
Natalizumab injection What is this medicine? NATALIZUMAB (na ta LIZ you mab) is used to treat relapsing multiple sclerosis. This drug is not a cure. It is also used to treat Crohn's disease. This medicine may be used for other purposes; ask your health care provider or pharmacist if you have questions. COMMON BRAND NAME(S): Tysabri What should I tell my health care provider before I take this medicine? They need to know if you have any of these conditions:  immune system problems  progressive multifocal leukoencephalopathy (PML)  an unusual or allergic reaction to natalizumab, other medicines, foods, dyes, or preservatives  pregnant or trying to get pregnant  breast-feeding How should I use this medicine? This medicine is for infusion into a vein. It is given by a health care professional in a hospital or clinic setting. A special MedGuide will be given to you by the pharmacist with each prescription and refill. Be sure to read this information carefully each time. Talk to your pediatrician regarding the use of this medicine in children. This medicine is not approved for use in children. Overdosage: If you think you have taken too much of this medicine contact a poison control center or emergency room at once. NOTE: This medicine is only for you. Do not share this medicine with others. What if I miss a dose? It is important not to miss your dose. Call your doctor or health care professional if you are unable to keep an appointment. What may interact with this medicine? Do not take this medicine with any of the following medications:  biologic medicines such as adalimumab, certolizumab, etanercept, golimumab, infliximab This medicine may also interact with the following medications:  azathioprine  cyclosporine  interferons  6-mercaptopurine  methotrexate  other medicines that lower your chance of fighting an infection  steroid medicines like prednisone or  cortisone  vaccines This list may not describe all possible interactions. Give your health care provider a list of all the medicines, herbs, non-prescription drugs, or dietary supplements you use. Also tell them if you smoke, drink alcohol, or use illegal drugs. Some items may interact with your medicine. What should I watch for while using this medicine? Your condition will be monitored carefully while you are receiving this medicine. Visit your doctor for regular check ups. Tell your doctor or healthcare professional if your symptoms do not start to get better or if they get worse. Stay away from people who are sick. Call your doctor or health care professional for advice if you get a fever, chills or sore throat, or other symptoms of a cold or flu. Do not treat yourself. In some patients, this medicine may cause a serious brain infection that may cause death. If you have any problems seeing, thinking, speaking, walking, or standing, tell your doctor right away. If you cannot reach your doctor, get urgent medical care. What side effects may I notice from receiving this medicine? Side effects that you should report to your doctor or health care professional as soon as possible:  allergic reactions like skin rash, itching or hives, swelling of the face, lips, or tongue  breathing problems  changes in vision  chest pain  confusion  depressed mood  dizziness  feeling faint; lightheaded; falls  general ill feeling or flu-like symptoms  loss of memory  missed menstrual periods  muscle weakness  problems with balance, talking, or walking  signs and symptoms of liver injury like dark yellow or brown urine; general ill feeling or flu-like symptoms; light-colored   stools; loss of appetite; nausea; right upper belly pain; unusually weak or tired; yellowing of the eyes or skin  suicidal thoughts, mood changes  unusual bruising or bleeding  unusually weak or tired Side effects that  usually do not require medical attention (report to your doctor or health care professional if they continue or are bothersome):  headache  joint pain  muscle cramps  muscle pain  nausea, vomiting  pain, redness, or irritation at site where injected  tiredness This list may not describe all possible side effects. Call your doctor for medical advice about side effects. You may report side effects to FDA at 1-800-FDA-1088. Where should I keep my medicine? This drug is given in a hospital or clinic and will not be stored at home. NOTE: This sheet is a summary. It may not cover all possible information. If you have questions about this medicine, talk to your doctor, pharmacist, or health care provider.  2020 Elsevier/Gold Standard (2018-10-31 13:20:26)  

## 2019-06-26 ENCOUNTER — Other Ambulatory Visit: Payer: Self-pay | Admitting: Neurology

## 2019-07-03 ENCOUNTER — Telehealth: Payer: Self-pay | Admitting: *Deleted

## 2019-07-03 NOTE — Telephone Encounter (Signed)
Faxed completed/signed Tysabri pt status report and reauth questionnaire to [facility] at [phone]. Received confirmation.  

## 2019-07-14 ENCOUNTER — Ambulatory Visit (HOSPITAL_COMMUNITY)
Admission: RE | Admit: 2019-07-14 | Discharge: 2019-07-14 | Disposition: A | Discharge status: Home / Self Care | Payer: Medicare Other | Source: Ambulatory Visit | Attending: Internal Medicine | Admitting: Internal Medicine

## 2019-07-14 ENCOUNTER — Other Ambulatory Visit: Payer: Self-pay

## 2019-07-14 DIAGNOSIS — G35 Multiple sclerosis: Secondary | ICD-10-CM | POA: Insufficient documentation

## 2019-07-14 MED ORDER — SODIUM CHLORIDE 0.9 % IV SOLN
300.0000 mg | INTRAVENOUS | Status: DC
  Administered 2019-07-14: 300 mg via INTRAVENOUS
  Filled 2019-07-14: qty 15

## 2019-07-14 MED ORDER — SODIUM CHLORIDE 0.9 % IV SOLN
INTRAVENOUS | Status: DC | PRN
  Administered 2019-07-14: 250 mL via INTRAVENOUS

## 2019-07-14 NOTE — Progress Notes (Signed)
PATIENT CARE CENTER NOTE  Diagnosis:Multiple Sclerosis (G[facility])   Provider:Sater, Richard, MD   Procedure:Tysabri infusion   Note:Patient received Tysabri infusion via PIV. Tolerated well with no adverse reaction. Vital signs stable. Patient did not want to wait for 1 hour post-infusion. Discharge instructions given. Patient alert, oriented and ambulatory at discharge.               

## 2019-07-14 NOTE — Discharge Instructions (Signed)
Natalizumab injection What is this medicine? NATALIZUMAB (na ta LIZ you mab) is used to treat relapsing multiple sclerosis. This drug is not a cure. It is also used to treat Crohn's disease. This medicine may be used for other purposes; ask your health care provider or pharmacist if you have questions. COMMON BRAND NAME(S): Tysabri What should I tell my health care provider before I take this medicine? They need to know if you have any of these conditions:  immune system problems  progressive multifocal leukoencephalopathy (PML)  an unusual or allergic reaction to natalizumab, other medicines, foods, dyes, or preservatives  pregnant or trying to get pregnant  breast-feeding How should I use this medicine? This medicine is for infusion into a vein. It is given by a health care professional in a hospital or clinic setting. A special MedGuide will be given to you by the pharmacist with each prescription and refill. Be sure to read this information carefully each time. Talk to your pediatrician regarding the use of this medicine in children. This medicine is not approved for use in children. Overdosage: If you think you have taken too much of this medicine contact a poison control center or emergency room at once. NOTE: This medicine is only for you. Do not share this medicine with others. What if I miss a dose? It is important not to miss your dose. Call your doctor or health care professional if you are unable to keep an appointment. What may interact with this medicine? Do not take this medicine with any of the following medications:  biologic medicines such as adalimumab, certolizumab, etanercept, golimumab, infliximab This medicine may also interact with the following medications:  azathioprine  cyclosporine  interferons  6-mercaptopurine  methotrexate  other medicines that lower your chance of fighting an infection  steroid medicines like prednisone or  cortisone  vaccines This list may not describe all possible interactions. Give your health care provider a list of all the medicines, herbs, non-prescription drugs, or dietary supplements you use. Also tell them if you smoke, drink alcohol, or use illegal drugs. Some items may interact with your medicine. What should I watch for while using this medicine? Your condition will be monitored carefully while you are receiving this medicine. Visit your doctor for regular check ups. Tell your doctor or healthcare professional if your symptoms do not start to get better or if they get worse. Stay away from people who are sick. Call your doctor or health care professional for advice if you get a fever, chills or sore throat, or other symptoms of a cold or flu. Do not treat yourself. In some patients, this medicine may cause a serious brain infection that may cause death. If you have any problems seeing, thinking, speaking, walking, or standing, tell your doctor right away. If you cannot reach your doctor, get urgent medical care. What side effects may I notice from receiving this medicine? Side effects that you should report to your doctor or health care professional as soon as possible:  allergic reactions like skin rash, itching or hives, swelling of the face, lips, or tongue  breathing problems  changes in vision  chest pain  confusion  depressed mood  dizziness  feeling faint; lightheaded; falls  general ill feeling or flu-like symptoms  loss of memory  missed menstrual periods  muscle weakness  problems with balance, talking, or walking  signs and symptoms of liver injury like dark yellow or brown urine; general ill feeling or flu-like symptoms; light-colored   stools; loss of appetite; nausea; right upper belly pain; unusually weak or tired; yellowing of the eyes or skin  suicidal thoughts, mood changes  unusual bruising or bleeding  unusually weak or tired Side effects that  usually do not require medical attention (report to your doctor or health care professional if they continue or are bothersome):  headache  joint pain  muscle cramps  muscle pain  nausea, vomiting  pain, redness, or irritation at site where injected  tiredness This list may not describe all possible side effects. Call your doctor for medical advice about side effects. You may report side effects to FDA at 1-800-FDA-1088. Where should I keep my medicine? This drug is given in a hospital or clinic and will not be stored at home. NOTE: This sheet is a summary. It may not cover all possible information. If you have questions about this medicine, talk to your doctor, pharmacist, or health care provider.  2020 Elsevier/Gold Standard (2018-10-31 13:20:26)  

## 2019-07-24 ENCOUNTER — Other Ambulatory Visit: Payer: Self-pay | Admitting: Neurology

## 2019-08-10 ENCOUNTER — Other Ambulatory Visit: Payer: Self-pay

## 2019-08-10 ENCOUNTER — Ambulatory Visit (HOSPITAL_COMMUNITY)
Admission: RE | Admit: 2019-08-10 | Discharge: 2019-08-10 | Disposition: A | Discharge status: Home / Self Care | Payer: Medicare Other | Source: Ambulatory Visit | Attending: Internal Medicine | Admitting: Internal Medicine

## 2019-08-10 DIAGNOSIS — G35 Multiple sclerosis: Secondary | ICD-10-CM | POA: Diagnosis not present

## 2019-08-10 MED ORDER — SODIUM CHLORIDE 0.9 % IV SOLN
INTRAVENOUS | Status: DC | PRN
  Administered 2019-08-10: 09:00:00 250 mL via INTRAVENOUS

## 2019-08-10 MED ORDER — SODIUM CHLORIDE 0.9 % IV SOLN
300.0000 mg | INTRAVENOUS | Status: DC
  Administered 2019-08-10: 300 mg via INTRAVENOUS
  Filled 2019-08-10: qty 15

## 2019-08-10 NOTE — Progress Notes (Addendum)
0815 [facility]ysabri [facility]. Pt A&Ox4, ambulatory on arrival. VSS, IV inserted. Pt resting in recliner awaiting med from pharmacy.   0900 Tysabri infusion started, WCTM.  0930 Tysabri half way done infusing, VSS, no adverse reactions noted, pt tolerating well, WCTM.

## 2019-08-10 NOTE — Progress Notes (Signed)
PATIENT CARE CENTER NOTE  Diagnosis:Multiple Sclerosis (G35)   Provider:Sater, Richard, MD   Procedure:Tysabri infusion   Note:Patient received Tysabri infusion via PIV. Tolerated well with no adverse reaction. Vital signs stable. Patient did not want to wait for 1 hour post-infusion. Discharge instructions given. Patient alert, oriented and ambulatory at discharge.

## 2019-08-10 NOTE — Discharge Instructions (Signed)
Natalizumab injection What is this medicine? NATALIZUMAB (na ta LIZ you mab) is used to treat relapsing multiple sclerosis. This drug is not a cure. It is also used to treat Crohn's disease. This medicine may be used for other purposes; ask your health care provider or pharmacist if you have questions. COMMON BRAND NAME(S): Tysabri What should I tell my health care provider before I take this medicine? They need to know if you have any of these conditions:  immune system problems  progressive multifocal leukoencephalopathy (PML)  an unusual or allergic reaction to natalizumab, other medicines, foods, dyes, or preservatives  pregnant or trying to get pregnant  breast-feeding How should I use this medicine? This medicine is for infusion into a vein. It is given by a health care professional in a hospital or clinic setting. A special MedGuide will be given to you by the pharmacist with each prescription and refill. Be sure to read this information carefully each time. Talk to your pediatrician regarding the use of this medicine in children. This medicine is not approved for use in children. Overdosage: If you think you have taken too much of this medicine contact a poison control center or emergency room at once. NOTE: This medicine is only for you. Do not share this medicine with others. What if I miss a dose? It is important not to miss your dose. Call your doctor or health care professional if you are unable to keep an appointment. What may interact with this medicine? Do not take this medicine with any of the following medications:  biologic medicines such as adalimumab, certolizumab, etanercept, golimumab, infliximab This medicine may also interact with the following medications:  azathioprine  cyclosporine  interferons  6-mercaptopurine  methotrexate  other medicines that lower your chance of fighting an infection  steroid medicines like prednisone or  cortisone  vaccines This list may not describe all possible interactions. Give your health care provider a list of all the medicines, herbs, non-prescription drugs, or dietary supplements you use. Also tell them if you smoke, drink alcohol, or use illegal drugs. Some items may interact with your medicine. What should I watch for while using this medicine? Your condition will be monitored carefully while you are receiving this medicine. Visit your doctor for regular check ups. Tell your doctor or healthcare professional if your symptoms do not start to get better or if they get worse. Stay away from people who are sick. Call your doctor or health care professional for advice if you get a fever, chills or sore throat, or other symptoms of a cold or flu. Do not treat yourself. In some patients, this medicine may cause a serious brain infection that may cause death. If you have any problems seeing, thinking, speaking, walking, or standing, tell your doctor right away. If you cannot reach your doctor, get urgent medical care. What side effects may I notice from receiving this medicine? Side effects that you should report to your doctor or health care professional as soon as possible:  allergic reactions like skin rash, itching or hives, swelling of the face, lips, or tongue  breathing problems  changes in vision  chest pain  confusion  depressed mood  dizziness  feeling faint; lightheaded; falls  general ill feeling or flu-like symptoms  loss of memory  missed menstrual periods  muscle weakness  problems with balance, talking, or walking  signs and symptoms of liver injury like dark yellow or brown urine; general ill feeling or flu-like symptoms; light-colored   stools; loss of appetite; nausea; right upper belly pain; unusually weak or tired; yellowing of the eyes or skin  suicidal thoughts, mood changes  unusual bruising or bleeding  unusually weak or tired Side effects that  usually do not require medical attention (report to your doctor or health care professional if they continue or are bothersome):  headache  joint pain  muscle cramps  muscle pain  nausea, vomiting  pain, redness, or irritation at site where injected  tiredness This list may not describe all possible side effects. Call your doctor for medical advice about side effects. You may report side effects to FDA at 1-800-FDA-1088. Where should I keep my medicine? This drug is given in a hospital or clinic and will not be stored at home. NOTE: This sheet is a summary. It may not cover all possible information. If you have questions about this medicine, talk to your doctor, pharmacist, or health care provider.  2020 Elsevier/Gold Standard (2018-10-31 13:20:26)  

## 2019-08-24 ENCOUNTER — Other Ambulatory Visit: Payer: Self-pay | Admitting: Neurology

## 2019-09-05 ENCOUNTER — Emergency Department (HOSPITAL_BASED_OUTPATIENT_CLINIC_OR_DEPARTMENT_OTHER)
Admission: EM | Admit: 2019-09-05 | Discharge: 2019-09-06 | Disposition: A | Discharge status: Home / Self Care | Payer: Medicare Other | Attending: Emergency Medicine | Admitting: Emergency Medicine

## 2019-09-05 ENCOUNTER — Other Ambulatory Visit: Payer: Self-pay

## 2019-09-05 ENCOUNTER — Emergency Department (HOSPITAL_BASED_OUTPATIENT_CLINIC_OR_DEPARTMENT_OTHER): Payer: Medicare Other

## 2019-09-05 ENCOUNTER — Encounter (HOSPITAL_BASED_OUTPATIENT_CLINIC_OR_DEPARTMENT_OTHER): Payer: Self-pay | Admitting: *Deleted

## 2019-09-05 DIAGNOSIS — Z79899 Other long term (current) drug therapy: Secondary | ICD-10-CM | POA: Diagnosis not present

## 2019-09-05 DIAGNOSIS — J069 Acute upper respiratory infection, unspecified: Secondary | ICD-10-CM | POA: Diagnosis not present

## 2019-09-05 DIAGNOSIS — Z87891 Personal history of nicotine dependence: Secondary | ICD-10-CM | POA: Insufficient documentation

## 2019-09-05 DIAGNOSIS — R509 Fever, unspecified: Secondary | ICD-10-CM | POA: Diagnosis present

## 2019-09-05 DIAGNOSIS — G35 Multiple sclerosis: Secondary | ICD-10-CM | POA: Insufficient documentation

## 2019-09-05 MED ORDER — ALBUTEROL SULFATE HFA 108 (90 BASE) MCG/ACT IN AERS
2.0000 | INHALATION_SPRAY | RESPIRATORY_TRACT | Status: DC | PRN
  Administered 2019-09-05: 2 via RESPIRATORY_TRACT
  Filled 2019-09-05: qty 6.7

## 2019-09-05 MED ORDER — DEXAMETHASONE SODIUM PHOSPHATE 10 MG/ML IJ SOLN
10.0000 mg | Freq: Once | INTRAMUSCULAR | Status: AC
  Administered 2019-09-05: 10 mg via INTRAMUSCULAR
  Filled 2019-09-05: qty 1

## 2019-09-05 NOTE — ED Provider Notes (Signed)
[facility] Provider Note: Caitlin Dell, MD, FACEP  CSN: [id number] MRN: [id number] ARRIVAL: 09/05/19 at 2209 ROOM: MH06/MH06   CHIEF COMPLAINT  Fever   HISTORY OF PRESENT ILLNESS  09/05/19 11:20 PM Caitlin [name] is a 34 y.o. female complains of cough, shortness of breath and nasal congestion that began this morning.  She has had a fever but took 800 mg of ibuprofen prior to arrival and her temperature on arrival was 99.9.  She denies loss of taste or smell, nausea, vomiting or diarrhea.  Symptoms are moderate and she describes associated discomfort in her chest is a 6 out of 10, worse with cough or deep breathing.  Shortness of breath is worse with exertion.   Past Medical History:  Diagnosis Date  . Back pain   . Headache   . MS (multiple sclerosis) (HCC)   . Pseudoseizures   . Vision abnormalities     Past Surgical History:  Procedure Laterality Date  . BACK SURGERY    . CESAREAN SECTION  2012  . CHOLECYSTECTOMY, LAPAROSCOPIC  2004  . KNEE SURGERY    . LUMBAR LAMINECTOMY    . VAGINAL HYSTERECTOMY      Family History  Problem Relation Age of Onset  . Diabetes type II Mother   . Hypertension Mother   . Fibromyalgia Mother   . Cardiomyopathy Father   . Heart attack Father     Social History   Tobacco Use  . Smoking status: Former Smoker    Packs/day: 1.00    Types: Cigarettes  . Smokeless tobacco: Never Used  Substance Use Topics  . Alcohol use: No    Alcohol/week: 0.0 standard drinks  . Drug use: No    Prior to Admission medications   Medication Sig Start Date End Date Taking? Authorizing Provider  amphetamine-dextroamphetamine (ADDERALL) 20 MG tablet TAKE 1 TABLET BY MOUTH 2 TIMES A DAY 08/24/19   Sater, Pearletha Furl, MD  chlorpheniramine-HYDROcodone (TUSSIONEX PENNKINETIC ER) 10-8 MG/5ML SUER Take 5 mLs by mouth every 12 (twelve) hours as needed. 09/06/19   Zenna Traister, MD  clonazePAM (KLONOPIN) 1 MG tablet 1/2 to 1 pill po qHS 04/24/19    Sater, Pearletha Furl, MD  Galcanezumab-gnlm [facility]) 120 MG/ML SOAJ Inject 1 Syringe into the skin every 30 (thirty) days. 10/20/18   Sater, Pearletha Furl, MD  natalizumab (TYSABRI) 300 MG/15ML injection Inject into the vein.    [provider]  tamsulosin (FLOMAX) 0.4 MG CAPS capsule Take 1 capsule (0.4 mg total) by mouth daily. 04/20/18   Sater, Pearletha Furl, MD  topiramate (TOPAMAX) 100 MG tablet TAKE 1 & 1/2 TABLETS BY MOUTH DAILY 04/24/19   Sater, Pearletha Furl, MD    Allergies Ciprofloxacin and Influenza vaccines   REVIEW OF SYSTEMS  Negative except as noted here or in the History of Present Illness.   PHYSICAL EXAMINATION  Initial Vital Signs Blood pressure (!) 128/93, pulse (!) 103, temperature 99.9 F (37.7 C), temperature source Oral, resp. rate 18, height 5\' 8"  (1.727 m), weight 102.5 kg, SpO2 100 %.  Examination General: Well-developed, well-nourished female in no acute distress; appearance consistent with age of record HENT: normocephalic; atraumatic; nasal congestion Eyes: pupils equal, round and reactive to light; extraocular muscles intact Neck: supple Heart: regular rate and rhythm Lungs: Shallow breaths with decreased air movement; no wheezing Abdomen: soft; nondistended; nontender; bowel sounds present Extremities: No deformity; full range of motion; pulses normal Neurologic: Awake, alert and oriented; motor function intact in all extremities  and symmetric; no facial droop Skin: Warm and dry Psychiatric: Normal mood and affect   RESULTS  Summary of this visit's results, reviewed and interpreted by myself:   EKG Interpretation  Date/Time:    Ventricular Rate:    PR Interval:    QRS Duration:   QT Interval:    QTC Calculation:   R Axis:     Text Interpretation:        Laboratory Studies: No results found for this or any previous visit (from the past 24 hour(s)). Imaging Studies: DG Chest Portable 1 View  Result Date: 09/05/2019 CLINICAL DATA:  Fever  and chest pain. EXAM: PORTABLE CHEST 1 VIEW COMPARISON:  November 16, 2018 FINDINGS: The heart size and mediastinal contours are within normal limits. Both lungs are clear. The visualized skeletal structures are unremarkable. IMPRESSION: No active disease. Electronically Signed   By: Virgina Norfolk M.D.   On: 09/05/2019 22:30    ED COURSE and MDM  Nursing notes, initial and subsequent vitals signs, including pulse oximetry, reviewed and interpreted by myself.  Vitals:   09/05/19 2214 09/05/19 2216 09/05/19 2343  BP:  (!) 128/93   Pulse:  (!) 103   Resp:  18   Temp:  99.9 F (37.7 C)   TempSrc:  Oral   SpO2:  100% 98%  Weight: 102.5 kg    Height: 5\' 8"  (1.727 m)     Medications  albuterol (VENTOLIN HFA) 108 (90 Base) MCG/ACT inhaler 2 puff (2 puffs Inhalation Given 09/05/19 2338)  chlorpheniramine-HYDROcodone (TUSSIONEX) 10-8 MG/5ML suspension 5 mL (has no administration in time range)  dexamethasone (DECADRON) injection 10 mg (10 mg Intramuscular Given 09/05/19 2357)    12:10 AM Air movement improved after albuterol treatment.  Patient given albuterol inhaler and AeroChamber and instructed in their use.  Patient refused Covid test.  PROCEDURES  Procedures   ED DIAGNOSES     ICD-10-CM   1. Viral upper respiratory tract infection with cough  J06.9        Kaytlynne Neace, MD 09/06/19 1655

## 2019-09-05 NOTE — ED Triage Notes (Addendum)
Pt c/o fever chest pain , body aches , cough x 1 day , Motrin 800mg  PTA

## 2019-09-06 MED ORDER — HYDROCOD POLST-CPM POLST ER 10-8 MG/5ML PO SUER
5.0000 mL | Freq: Once | ORAL | Status: AC
  Administered 2019-09-06: 5 mL via ORAL
  Filled 2019-09-06: qty 5

## 2019-09-06 MED ORDER — HYDROCOD POLST-CPM POLST ER 10-8 MG/5ML PO SUER: 5.0000 mL | Freq: Two times a day (BID) | ORAL | 0 refills | Status: DC | PRN

## 2019-09-07 ENCOUNTER — Ambulatory Visit (HOSPITAL_COMMUNITY): Payer: Medicare Other

## 2019-09-14 ENCOUNTER — Telehealth: Payer: Self-pay | Admitting: *Deleted

## 2019-09-14 NOTE — Telephone Encounter (Signed)
Late entry from this am: Took call from phone staff and spoke with Morrie Sheldon at [facility]. Pt has infusion next on 10/02/19 and they are needing updated orders. After reviewing, current PA on file for her Tysabri expired. I submitted urgent PA via fax to [facility] at [phone]. Included clinical notes for supporting documentation. Waiting on determination. Once I receive this, I will fax orders over.

## 2019-09-20 NOTE — Telephone Encounter (Signed)
I have not received a determination yet from [facility]. I call provider line at [phone] [phone] to check on status of PA but was not able to speak with rep. Automated system asked that we register at [email or web address] ([email or web address]).

## 2019-09-21 NOTE — Telephone Encounter (Signed)
I called [employer] and spoke with rep: Hazel. She checked and Tysabri does not require PA. Infusion site can do buy/bill. Faxed new order to [facility] attn: Morrie Sheldon at [phone]. Received fax confirmation.

## 2019-09-21 NOTE — Telephone Encounter (Signed)
Called pt. [employer] showing termed in February 2021. Pt states she only has [place] [employer] and Medicare now. Advised we are working on getting Tysabri re-authorized via her insurance for her next infusion. I will call her back if anything else is needed. She verbalized understanding.

## 2019-09-25 ENCOUNTER — Other Ambulatory Visit: Payer: Self-pay | Admitting: Neurology

## 2019-10-02 ENCOUNTER — Other Ambulatory Visit: Payer: Self-pay

## 2019-10-02 ENCOUNTER — Ambulatory Visit (HOSPITAL_COMMUNITY)
Admission: RE | Admit: 2019-10-02 | Discharge: 2019-10-02 | Disposition: A | Discharge status: Home / Self Care | Payer: Medicare Other | Source: Ambulatory Visit | Attending: Internal Medicine | Admitting: Internal Medicine

## 2019-10-02 DIAGNOSIS — G35 Multiple sclerosis: Secondary | ICD-10-CM | POA: Insufficient documentation

## 2019-10-02 MED ORDER — SODIUM CHLORIDE 0.9 % IV SOLN
INTRAVENOUS | Status: DC | PRN
  Administered 2019-10-02: 250 mL via INTRAVENOUS

## 2019-10-02 MED ORDER — SODIUM CHLORIDE 0.9 % IV SOLN
300.0000 mg | INTRAVENOUS | Status: DC
  Administered 2019-10-02: 300 mg via INTRAVENOUS
  Filled 2019-10-02: qty 15

## 2019-10-02 NOTE — Progress Notes (Signed)
PATIENT CARE CENTER NOTE  Diagnosis:Multiple Sclerosis (G[facility])   Provider:Sater, Richard, MD   Procedure:Tysabri infusion   Note:Patient received Tysabri infusion via PIV. Tolerated well with no adverse reaction. Vital signs stable. Patient did not want to wait for 1 hour post-infusion. Discharge instructions given. Patient alert, oriented and ambulatory at discharge.               

## 2019-10-02 NOTE — Discharge Instructions (Signed)
Natalizumab injection What is this medicine? NATALIZUMAB (na ta LIZ you mab) is used to treat relapsing multiple sclerosis. This drug is not a cure. It is also used to treat Crohn's disease. This medicine may be used for other purposes; ask your health care provider or pharmacist if you have questions. COMMON BRAND NAME(S): Tysabri What should I tell my health care provider before I take this medicine? They need to know if you have any of these conditions:  immune system problems  progressive multifocal leukoencephalopathy (PML)  an unusual or allergic reaction to natalizumab, other medicines, foods, dyes, or preservatives  pregnant or trying to get pregnant  breast-feeding How should I use this medicine? This medicine is for infusion into a vein. It is given by a health care professional in a hospital or clinic setting. A special MedGuide will be given to you by the pharmacist with each prescription and refill. Be sure to read this information carefully each time. Talk to your pediatrician regarding the use of this medicine in children. This medicine is not approved for use in children. Overdosage: If you think you have taken too much of this medicine contact a poison control center or emergency room at once. NOTE: This medicine is only for you. Do not share this medicine with others. What if I miss a dose? It is important not to miss your dose. Call your doctor or health care professional if you are unable to keep an appointment. What may interact with this medicine? Do not take this medicine with any of the following medications:  biologic medicines such as adalimumab, certolizumab, etanercept, golimumab, infliximab This medicine may also interact with the following medications:  azathioprine  cyclosporine  interferons  6-mercaptopurine  methotrexate  other medicines that lower your chance of fighting an infection  steroid medicines like prednisone or  cortisone  vaccines This list may not describe all possible interactions. Give your health care provider a list of all the medicines, herbs, non-prescription drugs, or dietary supplements you use. Also tell them if you smoke, drink alcohol, or use illegal drugs. Some items may interact with your medicine. What should I watch for while using this medicine? Your condition will be monitored carefully while you are receiving this medicine. Visit your doctor for regular check ups. Tell your doctor or healthcare professional if your symptoms do not start to get better or if they get worse. Stay away from people who are sick. Call your doctor or health care professional for advice if you get a fever, chills or sore throat, or other symptoms of a cold or flu. Do not treat yourself. In some patients, this medicine may cause a serious brain infection that may cause death. If you have any problems seeing, thinking, speaking, walking, or standing, tell your doctor right away. If you cannot reach your doctor, get urgent medical care. What side effects may I notice from receiving this medicine? Side effects that you should report to your doctor or health care professional as soon as possible:  allergic reactions like skin rash, itching or hives, swelling of the face, lips, or tongue  breathing problems  changes in vision  chest pain  confusion  depressed mood  dizziness  feeling faint; lightheaded; falls  general ill feeling or flu-like symptoms  loss of memory  missed menstrual periods  muscle weakness  problems with balance, talking, or walking  signs and symptoms of liver injury like dark yellow or brown urine; general ill feeling or flu-like symptoms; light-colored   stools; loss of appetite; nausea; right upper belly pain; unusually weak or tired; yellowing of the eyes or skin  suicidal thoughts, mood changes  unusual bruising or bleeding  unusually weak or tired Side effects that  usually do not require medical attention (report to your doctor or health care professional if they continue or are bothersome):  headache  joint pain  muscle cramps  muscle pain  nausea, vomiting  pain, redness, or irritation at site where injected  tiredness This list may not describe all possible side effects. Call your doctor for medical advice about side effects. You may report side effects to FDA at 1-800-FDA-1088. Where should I keep my medicine? This drug is given in a hospital or clinic and will not be stored at home. NOTE: This sheet is a summary. It may not cover all possible information. If you have questions about this medicine, talk to your doctor, pharmacist, or health care provider.  2020 Elsevier/Gold Standard (2018-10-31 13:20:26)  

## 2019-10-23 ENCOUNTER — Encounter: Payer: Self-pay | Admitting: Neurology

## 2019-10-23 ENCOUNTER — Ambulatory Visit (INDEPENDENT_AMBULATORY_CARE_PROVIDER_SITE_OTHER): Payer: No Typology Code available for payment source | Admitting: Neurology

## 2019-10-23 ENCOUNTER — Other Ambulatory Visit: Payer: Self-pay

## 2019-10-23 ENCOUNTER — Telehealth: Payer: Self-pay | Admitting: *Deleted

## 2019-10-23 VITALS — BP 129/78 | HR 67 | Ht 68.0 in | Wt 235.0 lb

## 2019-10-23 DIAGNOSIS — G43709 Chronic migraine without aura, not intractable, without status migrainosus: Secondary | ICD-10-CM

## 2019-10-23 DIAGNOSIS — R339 Retention of urine, unspecified: Secondary | ICD-10-CM

## 2019-10-23 DIAGNOSIS — Z79899 Other long term (current) drug therapy: Secondary | ICD-10-CM | POA: Diagnosis not present

## 2019-10-23 DIAGNOSIS — R269 Unspecified abnormalities of gait and mobility: Secondary | ICD-10-CM | POA: Diagnosis not present

## 2019-10-23 DIAGNOSIS — E559 Vitamin D deficiency, unspecified: Secondary | ICD-10-CM

## 2019-10-23 DIAGNOSIS — R5383 Other fatigue: Secondary | ICD-10-CM

## 2019-10-23 DIAGNOSIS — G35 Multiple sclerosis: Secondary | ICD-10-CM

## 2019-10-23 DIAGNOSIS — G2581 Restless legs syndrome: Secondary | ICD-10-CM

## 2019-10-23 DIAGNOSIS — IMO0002 Reserved for concepts with insufficient information to code with codable children: Secondary | ICD-10-CM

## 2019-10-23 MED ORDER — AMPHETAMINE-DEXTROAMPHETAMINE 20 MG PO TABS: 20.0000 mg | ORAL_TABLET | Freq: Two times a day (BID) | ORAL | 0 refills | Status: DC

## 2019-10-23 NOTE — Progress Notes (Signed)
[facility]  PATIENT: Caitlin [name] DOB: 02/28/1986  REFERRING CLINICIAN: Fredderick Severance  HISTORY FROM: patient REASON FOR VISIT: MS   HISTORICAL  CHIEF COMPLAINT:  Chief Complaint  Patient presents with  . Follow-up    MS on tysabri. Doing well.    HISTORY OF PRESENT ILLNESS:  Caitlin [name] is a 34 y.o. 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      Update 04/24/2019: She feels her MS has been stable.   She is on Tysabri and tolerates it well.   Her last JCV antibody was checked 04/24/2019 and was negative.  She denies any change in sensation, strength or gait.  She gets occasional left leg dysesthesias..   No falls.   Bladder function is doing ok with frequent UTI.  Flomax did not help.  Vision is stable now though she had some OD blurriness earlier this year.       She has fatigue, better with Adderall.  Sleep is much better and RLS has improved since starting clonazepam at bedtime.      Migraines are better on Emgality.  She has had only a few bad migraines the past 6 months. She takes NSAIDs, often with benefit if one occurs.    Triptans had not helped.   Laying in a dark room helped also.    Sleeping helps.    PDMP was reviewed and she has good compliance and only gets benxo's/stimulants medications form [place].   She had one hydrocodone script from outside for cough    Summary of symptoms from 12/01/2016:  MS:   She was diagnosed with MS in 2016 after presenting with left optic neuritis.  A couple years earlier, she had had possible transverse myelitis with leg weakness.  She was started on Tysabri as a DMT March 2016.    Despite a lot of symptoms, she has a very low plaque burden on the brain MRI and no lesions have been seen in her spine.   MS was diagnosed after an episode of optic neuritis and having a couple small non-specific foci on brain MRI.    A couple years earlier she had a possible transverse myelitis (vs mild GBS).        MRI of the cervical  spine 03/02/2016 was essentially normal. There was no evidence of cervical myelopathy and just disc bulge at 2 levels.  The MRI of the brain 06/01/2016 is unchanged compared to the MRI of the brain 01/31/2016 and showed no acute findings.     Pain:  She reports being in a lot more pain and is sitting in a wheelchair today.   Her worse pain is in her hands and legs and neck into her shoulders.   She does not feel she is getting any benefit from her medications.   She had lumbar surgery at L5S1 April 2017.     PseudoseizuresShe went to the [facility] ED 09/09/16 and was felt to be having pseudoseizures and was d/c home.   Earlier in the year, she was hospitalized and had EEG during one of the spells c/w pseudoseizures.    Of note, she is on lamotrigine and Topamax for pain and psych issues.     Mood:  She has anxiety and depression. She has pseudoseizures.   She is on Cymbalta (10 mg at bedtime) without much benefit.   She was seen once by psychiatry in the hospital.   She sees Behavioral Health at [facility] every 2-3 weeks.   Marland Kitchen  Gait/strength/numbness:  She reports poor balance and drags the left foot. She reports falling several times.    She notes weakness in the left foot and she feels there ia also mild weakness of the left hand.     She is doing therapy twice weekly  Bladder/bowel: She feels bowel and bladder are doing well.  Vision:   She had right optic neuritis.   Acuity improved but bright lights bother that eye.   Colors are mildly desaturated out of right eye.    She notes reduced right peripheral vision.      Fatigue/sleep:  She reports a lot of fatigue despite 20 mg x 2 Adderall daily.     She falls asleep well with clonazepam 1 mg and trazodone 100 mg but still has sleep maintenance issues.      REVIEW OF SYSTEMS:  Constitutional: No fevers, chills, sweats, or change in appetite.   She has fatigue and insomnia Eyes: as above Ear, nose and throat: No hearing loss, ear pain, nasal  congestion, sore throat Cardiovascular: No chest pain, palpitations Respiratory:  No shortness of breath at rest or with exertion.   No wheezes GastrointestinaI: No nausea, vomiting, diarrhea, abdominal pain, fecal incontinence Genitourinary:  She reports hesitancy.     She notes nocturia. Musculoskeletal:  No neck pain, some back pain Integumentary: No rash, pruritus, skin lesions Neurological: as above Psychiatric: Some depression at this time.  Some anxiety Endocrine: No palpitations, diaphoresis, change in appetite, change in weigh or increased thirst  ALLERGIES: Allergies  Allergen Reactions  . Ciprofloxacin Anaphylaxis  . Influenza Vaccines Other (See Comments)    Hx of Guillain Barre    HOME MEDICATIONS: Outpatient Medications Prior to Visit  Medication Sig Dispense Refill  . amphetamine-dextroamphetamine (ADDERALL) 20 MG tablet TAKE 1 TABLET BY MOUTH 2 TIMES A DAY 60 tablet 0  . clonazePAM (KLONOPIN) 1 MG tablet 1/2 to 1 pill po qHS 30 tablet 5  . Galcanezumab-gnlm (EMGALITY) 120 MG/ML SOAJ Inject 1 Syringe into the skin every 30 (thirty) days. 3 pen 4  . natalizumab (TYSABRI) 300 MG/15ML injection Inject into the vein.    . chlorpheniramine-HYDROcodone (TUSSIONEX PENNKINETIC ER) 10-8 MG/5ML SUER Take 5 mLs by mouth every 12 (twelve) hours as needed. 70 mL 0  . tamsulosin (FLOMAX) 0.4 MG CAPS capsule Take 1 capsule (0.4 mg total) by mouth daily. 90 capsule 3  . topiramate (TOPAMAX) 100 MG tablet TAKE 1 & 1/2 TABLETS BY MOUTH DAILY 135 tablet 3   Facility-Administered Medications Prior to Visit  Medication Dose Route Frequency Provider Last Rate Last Admin  . methylPREDNISolone sodium succinate (SOLU-MEDROL) 1,000 mg in sodium chloride 0.9 % 100 mL IVPB  1,000 mg Intravenous Continuous Milla Wahlberg A, MD   1,000 mg at 07/11/14 1335  . methylPREDNISolone sodium succinate (SOLU-MEDROL) 1,000 mg in sodium chloride 0.9 % 100 mL IVPB  1,000 mg Intravenous Continuous Asa Lente, MD   Stopped at 07/12/14 1530    PAST MEDICAL HISTORY: Past Medical History:  Diagnosis Date  . Back pain   . Headache   . MS (multiple sclerosis) (HCC)   . Pseudoseizures   . Vision abnormalities     PAST SURGICAL HISTORY: Past Surgical History:  Procedure Laterality Date  . BACK SURGERY    . CESAREAN SECTION  2012  . CHOLECYSTECTOMY, LAPAROSCOPIC  2004  . KNEE SURGERY    . LUMBAR LAMINECTOMY    . VAGINAL HYSTERECTOMY      FAMILY HISTORY:  Family History  Problem Relation Age of Onset  . Diabetes type II Mother   . Hypertension Mother   . Fibromyalgia Mother   . Cardiomyopathy Father   . Heart attack Father     SOCIAL HISTORY:  Social History   Socioeconomic History  . Marital status: Married    Spouse name: Not on file  . Number of children: Not on file  . Years of education: Not on file  . Highest education level: Not on file  Occupational History  . Not on file  Tobacco Use  . Smoking status: Former Smoker    Packs/day: 1.00    Types: Cigarettes  . Smokeless tobacco: Never Used  Vaping Use  . Vaping Use: Every day  . Substances: Nicotine, Flavoring  Substance and Sexual Activity  . Alcohol use: No    Alcohol/week: 0.0 standard drinks  . Drug use: No  . Sexual activity: Not on file  Other Topics Concern  . Not on file  Social History Narrative  . Not on file   Social Determinants of Health   Financial Resource Strain:   . Difficulty of Paying Living Expenses:   Food Insecurity:   . Worried About Programme researcher, broadcasting/film/video in the Last Year:   . Barista in the Last Year:   Transportation Needs:   . Freight forwarder (Medical):   Marland Kitchen Lack of Transportation (Non-Medical):   Physical Activity:   . Days of Exercise per Week:   . Minutes of Exercise per Session:   Stress:   . Feeling of Stress :   Social Connections:   . Frequency of Communication with Friends and Family:   . Frequency of Social Gatherings with Friends and  Family:   . Attends Religious Services:   . Active Member of Clubs or Organizations:   . Attends Banker Meetings:   Marland Kitchen Marital Status:   Intimate Partner Violence:   . Fear of Current or Ex-Partner:   . Emotionally Abused:   Marland Kitchen Physically Abused:   . Sexually Abused:      PHYSICAL EXAM  Vitals:   10/23/19 1051  Weight: 235 lb (106.6 kg)  Height: 5\' 8"  (1.727 m)    Body mass index is 35.73 kg/m.   General: The patient is well-developed and well-nourished and in no acute distress.  Neck range of motion is normal.  Neurologic Exam  Mental status: The patient is alert and oriented x 3 at the time of the examination.  Normal memory and attention.     Cranial nerves: EOM intact.  There is reduced color vision out of the right eye.     Speech is normal.    Normal facial strength and slightly decreased left sensation.   SCM/trapezius are strong.   Hearing is symmetric         Motor:  Muscle bulk and tone are normal. Strength is  5 / 5 in the arms.  Strength was 5/5 except for 4+/5 left toe, foot and ankle extension.  Coordination:   Finger-nose-finger is normal bilaterally.  Left Heel to shin is mildly reduced.  .   Sensory: On sensory testing she reports decreased sensation to vibration > touch in the left leg.  Sensation is symmetric in arms       Gait and station: Station is normal. The gait is arthritic and mildly wide.  Tandem gait is wide.   Romberg is negative.  Reflexes: DTRs were mildly asymmetric, increased on  her left..  .      ASSESSMENT AND PLAN 1. Multiple sclerosis ([place])   2. High risk medication use   3. Gait disturbance   4. Other fatigue   5. Chronic migraine     1.   Continue Tysabri.  Check CBC and JCV Ab today 2.   Continue Emgality for chronic migraines  3.   Continue Adderall for fatigue, clonazepam for sleep/anxiety 4.   Stay active and exercise as tolerated Return in 6 months or sooner if there are new or worsening neurologic  symptoms.     Thi Sisemore A. Felecia Shelling, MD, PhD 5/85/9292, 44:62 AM Certified in Neurology, Clinical Neurophysiology, Sleep Medicine, Pain Medicine and Neuroimaging  [facility] [address], [place], [place] [address] [phone]

## 2019-10-23 NOTE — Telephone Encounter (Signed)
Placed [facility] lab in [facility] lock box for routine lab pick up. Results pending. 

## 2019-10-24 LAB — CBC WITH DIFFERENTIAL/PLATELET
Basophils Absolute: 0 10*3/uL (ref 0.0–0.2)
Basos: 0 %
EOS (ABSOLUTE): 0.2 10*3/uL (ref 0.0–0.4)
Eos: 2 %
Hematocrit: 41.4 % (ref 34.0–46.6)
Hemoglobin: 13.9 g/dL (ref 11.1–15.9)
Immature Grans (Abs): 0.1 10*3/uL (ref 0.0–0.1)
Immature Granulocytes: 1 %
Lymphocytes Absolute: 3.8 10*3/uL — ABNORMAL HIGH (ref 0.7–3.1)
Lymphs: 50 %
MCH: 30.2 pg (ref 26.6–33.0)
MCHC: 33.6 g/dL (ref 31.5–35.7)
MCV: 90 fL (ref 79–97)
Monocytes Absolute: 0.5 10*3/uL (ref 0.1–0.9)
Monocytes: 7 %
Neutrophils Absolute: 3 10*3/uL (ref 1.4–7.0)
Neutrophils: 40 %
Platelets: 229 10*3/uL (ref 150–450)
RBC: 4.61 x10E6/uL (ref 3.77–5.28)
RDW: 13.9 % (ref 11.7–15.4)
WBC: 7.6 10*3/uL (ref 3.4–10.8)

## 2019-10-24 LAB — VITAMIN D 25 HYDROXY (VIT D DEFICIENCY, FRACTURES): Vit D, 25-Hydroxy: 19.9 ng/mL — ABNORMAL LOW (ref 30.0–100.0)

## 2019-10-25 ENCOUNTER — Telehealth: Payer: Self-pay

## 2019-10-25 NOTE — Telephone Encounter (Signed)
I called pt. No answer, left a message asking pt to call me back.   

## 2019-10-25 NOTE — Telephone Encounter (Signed)
-----   Message from Geronimo Running, RN sent at 10/24/2019  9:31 AM EDT -----  ----- Message ----- From: Asa Lente, MD Sent: 10/24/2019   9:18 AM EDT To: Geronimo Running, RN  Vitamin D is low.  Please send in a prescription for 50,000 units weekly (#13 with 3 refills)

## 2019-10-30 ENCOUNTER — Telehealth: Payer: Self-pay | Admitting: Neurology

## 2019-10-30 MED ORDER — VITAMIN D (ERGOCALCIFEROL) 1.25 MG (50000 UNIT) PO CAPS: 50000.0000 [IU] | ORAL_CAPSULE | ORAL | 3 refills | Status: DC

## 2019-10-30 NOTE — Addendum Note (Signed)
Addended by: Ann Maki on: 10/30/2019 02:33 PM   Modules accepted: Orders

## 2019-10-30 NOTE — Telephone Encounter (Signed)
Pt has called Megan,RN back.  Please call pt back

## 2019-10-30 NOTE — Telephone Encounter (Signed)
JCV ab drawn 10/23/19. Negative, index: 0.15.

## 2019-10-30 NOTE — Telephone Encounter (Signed)
I contacted the pt and advised of result. Pt verbalized understanding and was agreeable to taking the vitamin d prescription.

## 2019-10-31 ENCOUNTER — Ambulatory Visit (HOSPITAL_COMMUNITY): Payer: Medicare Other

## 2019-11-02 ENCOUNTER — Other Ambulatory Visit: Payer: Self-pay

## 2019-11-02 ENCOUNTER — Ambulatory Visit (HOSPITAL_COMMUNITY)
Admission: RE | Admit: 2019-11-02 | Discharge: 2019-11-02 | Disposition: A | Discharge status: Home / Self Care | Payer: Medicare Other | Source: Ambulatory Visit | Attending: Internal Medicine | Admitting: Internal Medicine

## 2019-11-02 DIAGNOSIS — G35 Multiple sclerosis: Secondary | ICD-10-CM | POA: Insufficient documentation

## 2019-11-02 MED ORDER — SODIUM CHLORIDE 0.9 % IV SOLN
INTRAVENOUS | Status: DC | PRN
  Administered 2019-11-02: 250 mL via INTRAVENOUS

## 2019-11-02 MED ORDER — SODIUM CHLORIDE 0.9 % IV SOLN
300.0000 mg | INTRAVENOUS | Status: DC
  Administered 2019-11-02: 300 mg via INTRAVENOUS
  Filled 2019-11-02: qty 15

## 2019-11-02 NOTE — Discharge Instructions (Signed)
Natalizumab injection What is this medicine? NATALIZUMAB (na ta LIZ you mab) is used to treat relapsing multiple sclerosis. This drug is not a cure. It is also used to treat Crohn's disease. This medicine may be used for other purposes; ask your health care provider or pharmacist if you have questions. COMMON BRAND NAME(S): Tysabri What should I tell my health care provider before I take this medicine? They need to know if you have any of these conditions:  immune system problems  progressive multifocal leukoencephalopathy (PML)  an unusual or allergic reaction to natalizumab, other medicines, foods, dyes, or preservatives  pregnant or trying to get pregnant  breast-feeding How should I use this medicine? This medicine is for infusion into a vein. It is given by a health care professional in a hospital or clinic setting. A special MedGuide will be given to you by the pharmacist with each prescription and refill. Be sure to read this information carefully each time. Talk to your pediatrician regarding the use of this medicine in children. This medicine is not approved for use in children. Overdosage: If you think you have taken too much of this medicine contact a poison control center or emergency room at once. NOTE: This medicine is only for you. Do not share this medicine with others. What if I miss a dose? It is important not to miss your dose. Call your doctor or health care professional if you are unable to keep an appointment. What may interact with this medicine? Do not take this medicine with any of the following medications:  biologic medicines such as adalimumab, certolizumab, etanercept, golimumab, infliximab This medicine may also interact with the following medications:  azathioprine  cyclosporine  interferons  6-mercaptopurine  methotrexate  other medicines that lower your chance of fighting an infection  steroid medicines like prednisone or  cortisone  vaccines This list may not describe all possible interactions. Give your health care provider a list of all the medicines, herbs, non-prescription drugs, or dietary supplements you use. Also tell them if you smoke, drink alcohol, or use illegal drugs. Some items may interact with your medicine. What should I watch for while using this medicine? Your condition will be monitored carefully while you are receiving this medicine. Visit your doctor for regular check ups. Tell your doctor or healthcare professional if your symptoms do not start to get better or if they get worse. Stay away from people who are sick. Call your doctor or health care professional for advice if you get a fever, chills or sore throat, or other symptoms of a cold or flu. Do not treat yourself. In some patients, this medicine may cause a serious brain infection that may cause death. If you have any problems seeing, thinking, speaking, walking, or standing, tell your doctor right away. If you cannot reach your doctor, get urgent medical care. What side effects may I notice from receiving this medicine? Side effects that you should report to your doctor or health care professional as soon as possible:  allergic reactions like skin rash, itching or hives, swelling of the face, lips, or tongue  breathing problems  changes in vision  chest pain  confusion  depressed mood  dizziness  feeling faint; lightheaded; falls  general ill feeling or flu-like symptoms  loss of memory  missed menstrual periods  muscle weakness  problems with balance, talking, or walking  signs and symptoms of liver injury like dark yellow or brown urine; general ill feeling or flu-like symptoms; light-colored   stools; loss of appetite; nausea; right upper belly pain; unusually weak or tired; yellowing of the eyes or skin  suicidal thoughts, mood changes  unusual bruising or bleeding  unusually weak or tired Side effects that  usually do not require medical attention (report to your doctor or health care professional if they continue or are bothersome):  headache  joint pain  muscle cramps  muscle pain  nausea, vomiting  pain, redness, or irritation at site where injected  tiredness This list may not describe all possible side effects. Call your doctor for medical advice about side effects. You may report side effects to FDA at 1-800-FDA-1088. Where should I keep my medicine? This drug is given in a hospital or clinic and will not be stored at home. NOTE: This sheet is a summary. It may not cover all possible information. If you have questions about this medicine, talk to your doctor, pharmacist, or health care provider.  2020 Elsevier/Gold Standard (2018-10-31 13:20:26)  

## 2019-11-02 NOTE — Progress Notes (Signed)
Patient received IV Tysabri as ordered by Sater Richard MD. Patient declined the 60 minutes post infusion observation.Tolerated well, vitals stable, discharge instructions given, verbalized understanding. Patient alert, oriented and ambulatory at the time of discharge.  

## 2019-11-07 NOTE — Telephone Encounter (Signed)
Error

## 2019-11-08 ENCOUNTER — Other Ambulatory Visit: Payer: Self-pay | Admitting: Neurology

## 2019-11-24 ENCOUNTER — Other Ambulatory Visit: Payer: Self-pay | Admitting: Neurology

## 2019-11-30 ENCOUNTER — Ambulatory Visit (HOSPITAL_COMMUNITY)
Admission: RE | Admit: 2019-11-30 | Discharge: 2019-11-30 | Disposition: A | Discharge status: Home / Self Care | Payer: Medicare Other | Source: Ambulatory Visit | Attending: Internal Medicine | Admitting: Internal Medicine

## 2019-11-30 ENCOUNTER — Other Ambulatory Visit: Payer: Self-pay

## 2019-11-30 DIAGNOSIS — G35 Multiple sclerosis: Secondary | ICD-10-CM | POA: Insufficient documentation

## 2019-11-30 MED ORDER — SODIUM CHLORIDE 0.9 % IV SOLN
INTRAVENOUS | Status: DC | PRN
  Administered 2019-11-30: 250 mL via INTRAVENOUS

## 2019-11-30 MED ORDER — SODIUM CHLORIDE 0.9 % IV SOLN
300.0000 mg | INTRAVENOUS | Status: DC
  Administered 2019-11-30: 300 mg via INTRAVENOUS
  Filled 2019-11-30: qty 15

## 2019-11-30 NOTE — Discharge Instructions (Signed)
Natalizumab injection What is this medicine? NATALIZUMAB (na ta LIZ you mab) is used to treat relapsing multiple sclerosis. This drug is not a cure. It is also used to treat Crohn's disease. This medicine may be used for other purposes; ask your health care provider or pharmacist if you have questions. COMMON BRAND NAME(S): Tysabri What should I tell my health care provider before I take this medicine? They need to know if you have any of these conditions:  immune system problems  progressive multifocal leukoencephalopathy (PML)  an unusual or allergic reaction to natalizumab, other medicines, foods, dyes, or preservatives  pregnant or trying to get pregnant  breast-feeding How should I use this medicine? This medicine is for infusion into a vein. It is given by a health care professional in a hospital or clinic setting. A special MedGuide will be given to you by the pharmacist with each prescription and refill. Be sure to read this information carefully each time. Talk to your pediatrician regarding the use of this medicine in children. This medicine is not approved for use in children. Overdosage: If you think you have taken too much of this medicine contact a poison control center or emergency room at once. NOTE: This medicine is only for you. Do not share this medicine with others. What if I miss a dose? It is important not to miss your dose. Call your doctor or health care professional if you are unable to keep an appointment. What may interact with this medicine? Do not take this medicine with any of the following medications:  biologic medicines such as adalimumab, certolizumab, etanercept, golimumab, infliximab This medicine may also interact with the following medications:  azathioprine  cyclosporine  interferons  6-mercaptopurine  methotrexate  other medicines that lower your chance of fighting an infection  steroid medicines like prednisone or  cortisone  vaccines This list may not describe all possible interactions. Give your health care provider a list of all the medicines, herbs, non-prescription drugs, or dietary supplements you use. Also tell them if you smoke, drink alcohol, or use illegal drugs. Some items may interact with your medicine. What should I watch for while using this medicine? Your condition will be monitored carefully while you are receiving this medicine. Visit your doctor for regular check ups. Tell your doctor or healthcare professional if your symptoms do not start to get better or if they get worse. Stay away from people who are sick. Call your doctor or health care professional for advice if you get a fever, chills or sore throat, or other symptoms of a cold or flu. Do not treat yourself. In some patients, this medicine may cause a serious brain infection that may cause death. If you have any problems seeing, thinking, speaking, walking, or standing, tell your doctor right away. If you cannot reach your doctor, get urgent medical care. What side effects may I notice from receiving this medicine? Side effects that you should report to your doctor or health care professional as soon as possible:  allergic reactions like skin rash, itching or hives, swelling of the face, lips, or tongue  breathing problems  changes in vision  chest pain  confusion  depressed mood  dizziness  feeling faint; lightheaded; falls  general ill feeling or flu-like symptoms  loss of memory  missed menstrual periods  muscle weakness  problems with balance, talking, or walking  signs and symptoms of liver injury like dark yellow or brown urine; general ill feeling or flu-like symptoms; light-colored   stools; loss of appetite; nausea; right upper belly pain; unusually weak or tired; yellowing of the eyes or skin  suicidal thoughts, mood changes  unusual bruising or bleeding  unusually weak or tired Side effects that  usually do not require medical attention (report to your doctor or health care professional if they continue or are bothersome):  headache  joint pain  muscle cramps  muscle pain  nausea, vomiting  pain, redness, or irritation at site where injected  tiredness This list may not describe all possible side effects. Call your doctor for medical advice about side effects. You may report side effects to FDA at 1-800-FDA-1088. Where should I keep my medicine? This drug is given in a hospital or clinic and will not be stored at home. NOTE: This sheet is a summary. It may not cover all possible information. If you have questions about this medicine, talk to your doctor, pharmacist, or health care provider.  2020 Elsevier/Gold Standard (2018-10-31 13:20:26)  

## 2019-11-30 NOTE — Progress Notes (Signed)
PATIENT CARE CENTER NOTE  Diagnosis:Multiple Sclerosis (G[facility])   Provider:Sater, Richard, MD   Procedure:Tysabri infusion   Note:Patient received Tysabri infusion via PIV. Tolerated well with no adverse reaction. Vital signs stable. Patient declined  to wait for 1 hour post-infusion. Discharge instructions given. Patient alert, oriented and ambulatory at discharge.

## 2019-12-25 ENCOUNTER — Telehealth: Payer: Self-pay | Admitting: *Deleted

## 2019-12-25 NOTE — Telephone Encounter (Signed)
Faxed completed/signed Tysabri pt status report and reauth questionnaire to [facility] at [phone]. Received confirmation.  

## 2019-12-28 ENCOUNTER — Other Ambulatory Visit: Payer: Self-pay | Admitting: Neurology

## 2020-01-01 ENCOUNTER — Other Ambulatory Visit: Payer: Self-pay

## 2020-01-01 ENCOUNTER — Ambulatory Visit (HOSPITAL_COMMUNITY)
Admission: RE | Admit: 2020-01-01 | Discharge: 2020-01-01 | Disposition: A | Discharge status: Home / Self Care | Payer: Medicare Other | Source: Ambulatory Visit | Attending: Internal Medicine | Admitting: Internal Medicine

## 2020-01-01 DIAGNOSIS — G35 Multiple sclerosis: Secondary | ICD-10-CM | POA: Insufficient documentation

## 2020-01-01 MED ORDER — SODIUM CHLORIDE 0.9 % IV SOLN
300.0000 mg | INTRAVENOUS | Status: DC
  Administered 2020-01-01: 300 mg via INTRAVENOUS
  Filled 2020-01-01: qty 15

## 2020-01-01 MED ORDER — SODIUM CHLORIDE 0.9 % IV SOLN
INTRAVENOUS | Status: DC | PRN
  Administered 2020-01-01: 250 mL via INTRAVENOUS

## 2020-01-01 NOTE — Discharge Instructions (Signed)
Natalizumab injection What is this medicine? NATALIZUMAB (na ta LIZ you mab) is used to treat relapsing multiple sclerosis. This drug is not a cure. It is also used to treat Crohn's disease. This medicine may be used for other purposes; ask your health care provider or pharmacist if you have questions. COMMON BRAND NAME(S): Tysabri What should I tell my health care provider before I take this medicine? They need to know if you have any of these conditions:  immune system problems  progressive multifocal leukoencephalopathy (PML)  an unusual or allergic reaction to natalizumab, other medicines, foods, dyes, or preservatives  pregnant or trying to get pregnant  breast-feeding How should I use this medicine? This medicine is for infusion into a vein. It is given by a health care professional in a hospital or clinic setting. A special MedGuide will be given to you by the pharmacist with each prescription and refill. Be sure to read this information carefully each time. Talk to your pediatrician regarding the use of this medicine in children. This medicine is not approved for use in children. Overdosage: If you think you have taken too much of this medicine contact a poison control center or emergency room at once. NOTE: This medicine is only for you. Do not share this medicine with others. What if I miss a dose? It is important not to miss your dose. Call your doctor or health care professional if you are unable to keep an appointment. What may interact with this medicine? Do not take this medicine with any of the following medications:  biologic medicines such as adalimumab, certolizumab, etanercept, golimumab, infliximab This medicine may also interact with the following medications:  azathioprine  cyclosporine  interferons  6-mercaptopurine  methotrexate  other medicines that lower your chance of fighting an infection  steroid medicines like prednisone or  cortisone  vaccines This list may not describe all possible interactions. Give your health care provider a list of all the medicines, herbs, non-prescription drugs, or dietary supplements you use. Also tell them if you smoke, drink alcohol, or use illegal drugs. Some items may interact with your medicine. What should I watch for while using this medicine? Your condition will be monitored carefully while you are receiving this medicine. Visit your doctor for regular check ups. Tell your doctor or healthcare professional if your symptoms do not start to get better or if they get worse. Stay away from people who are sick. Call your doctor or health care professional for advice if you get a fever, chills or sore throat, or other symptoms of a cold or flu. Do not treat yourself. In some patients, this medicine may cause a serious brain infection that may cause death. If you have any problems seeing, thinking, speaking, walking, or standing, tell your doctor right away. If you cannot reach your doctor, get urgent medical care. What side effects may I notice from receiving this medicine? Side effects that you should report to your doctor or health care professional as soon as possible:  allergic reactions like skin rash, itching or hives, swelling of the face, lips, or tongue  breathing problems  changes in vision  chest pain  confusion  depressed mood  dizziness  feeling faint; lightheaded; falls  general ill feeling or flu-like symptoms  loss of memory  missed menstrual periods  muscle weakness  problems with balance, talking, or walking  signs and symptoms of liver injury like dark yellow or brown urine; general ill feeling or flu-like symptoms; light-colored   stools; loss of appetite; nausea; right upper belly pain; unusually weak or tired; yellowing of the eyes or skin  suicidal thoughts, mood changes  unusual bruising or bleeding  unusually weak or tired Side effects that  usually do not require medical attention (report to your doctor or health care professional if they continue or are bothersome):  headache  joint pain  muscle cramps  muscle pain  nausea, vomiting  pain, redness, or irritation at site where injected  tiredness This list may not describe all possible side effects. Call your doctor for medical advice about side effects. You may report side effects to FDA at 1-800-FDA-1088. Where should I keep my medicine? This drug is given in a hospital or clinic and will not be stored at home. NOTE: This sheet is a summary. It may not cover all possible information. If you have questions about this medicine, talk to your doctor, pharmacist, or health care provider.  2020 Elsevier/Gold Standard (2018-10-31 13:20:26)  

## 2020-01-01 NOTE — Progress Notes (Signed)
Patient received IV Tysabri as ordered by Despina Arias, MD. Patient declined the 60 minutes post infusion observation.Tolerated well, vitals stable, discharge instructions given, verbalized understanding. Patient alert, oriented and ambulatory at the time of discharge.

## 2020-01-22 ENCOUNTER — Telehealth: Payer: Self-pay | Admitting: Neurology

## 2020-01-22 NOTE — Telephone Encounter (Signed)
Pt notifying physician she has tested positive for COVID. Do not want a call back.

## 2020-01-25 ENCOUNTER — Encounter (HOSPITAL_BASED_OUTPATIENT_CLINIC_OR_DEPARTMENT_OTHER): Payer: Self-pay | Admitting: Emergency Medicine

## 2020-01-25 ENCOUNTER — Other Ambulatory Visit: Payer: Self-pay

## 2020-01-25 ENCOUNTER — Emergency Department (HOSPITAL_BASED_OUTPATIENT_CLINIC_OR_DEPARTMENT_OTHER): Payer: Medicare Other

## 2020-01-25 ENCOUNTER — Emergency Department (HOSPITAL_BASED_OUTPATIENT_CLINIC_OR_DEPARTMENT_OTHER)
Admission: EM | Admit: 2020-01-25 | Discharge: 2020-01-25 | Disposition: A | Discharge status: Home / Self Care | Payer: Medicare Other | Attending: Emergency Medicine | Admitting: Emergency Medicine

## 2020-01-25 DIAGNOSIS — U071 COVID-19: Secondary | ICD-10-CM | POA: Diagnosis not present

## 2020-01-25 DIAGNOSIS — Z87891 Personal history of nicotine dependence: Secondary | ICD-10-CM | POA: Insufficient documentation

## 2020-01-25 DIAGNOSIS — R0602 Shortness of breath: Secondary | ICD-10-CM | POA: Diagnosis present

## 2020-01-25 HISTORY — DX: Guillain-Barre syndrome: G61.0

## 2020-01-25 NOTE — ED Notes (Signed)
Pt ambulated to room on pulse ox at 98%.

## 2020-01-25 NOTE — ED Provider Notes (Signed)
[facility] EMERGENCY DEPARTMENT Provider Note   CSN: [id number] Arrival date & time: 01/25/20  1555     History Chief Complaint  Patient presents with  . Shortness of Breath    COVID+    Caitlin [name] is a 34 y.o. female with a past medical history of MS presenting to the ED with shortness of breath and chest tightness.  Symptoms began on 01/21/2020 when she had a positive home Covid test.  She has been taking antipyretics with some improvement in her symptoms.  Also been taking Zofran that was given to her by neighbor.  Reports decreased appetite, fatigue, nausea and myalgias as well.  States that she is mostly here because her husband wanted her to "get checked out."  She is interested in seeing if she qualifies for a Mab infusion.  She denies any abdominal pain, leg swelling or hemoptysis.  HPI     Past Medical History:  Diagnosis Date  . Back pain   . Guillain Barr syndrome (HCC)   . Headache   . MS (multiple sclerosis) (HCC)   . Pseudoseizures   . Vision abnormalities     Patient Active Problem List   Diagnosis Date Noted  . High risk medication use 10/20/2018  . Chronic migraine 10/07/2017  . Insomnia 10/07/2017  . Left foot drop 11/02/2016  . 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 Imbalance 09/25/2015  . Encounter for follow-up examination after completed treatment for conditions other than malignant neoplasm 09/10/2015  . 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 Cervical disc disease 07/26/2015  . Degeneration of intervertebral disc of lumbar region 07/26/2015  . Lumbar radiculopathy 07/26/2015  . 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 Acute low back pain 07/09/2015  . Elevated lymphocyte count 02/12/2015  . Pain of upper abdomen 02/12/2015  . Hemorrhage, petechial 02/12/2015  . Gonalgia 01/29/2015  . Knee MCL sprain 01/29/2015  . Other headache syndrome 11/26/2014  . Neck pain 11/26/2014  . Headache disorder 11/26/2014  .  Encounter for surgical follow-up care 11/14/2014  . Other fatigue 11/13/2014  . Fatigue 11/13/2014  . Incomplete bladder emptying 11/09/2014  . 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 Gait disturbance 07/11/2014  . Abnormal gait 07/11/2014  . Multiple sclerosis (HCC) 06/20/2014  . Optic neuritis 06/20/2014  . Numbness 06/20/2014  . 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 Adjustment disorder with mixed anxiety and depressed mood 06/20/2014  . Urinary frequency 06/20/2014  . FOM (frequency of micturition) 06/20/2014  . Dislocated patella 11/28/2013    Past Surgical History:  Procedure Laterality Date  . BACK SURGERY    . CESAREAN SECTION  2012  . CHOLECYSTECTOMY, LAPAROSCOPIC  2004  . KNEE SURGERY    . LUMBAR LAMINECTOMY    . VAGINAL HYSTERECTOMY       OB History   No obstetric history on file.     Family History  Problem Relation Age of Onset  . Diabetes type II Mother   . Hypertension Mother   . Fibromyalgia Mother   . Cardiomyopathy Father   . Heart attack Father     Social History   Tobacco Use  . Smoking status: Former Smoker    Packs/day: 1.00    Types: Cigarettes  . Smokeless tobacco: Never Used  Vaping Use  . Vaping Use: Every day  . Substances: Nicotine, Flavoring  Substance Use Topics  . Alcohol use: No    Alcohol/week: 0.0 standard drinks  .  Drug use: No    Home Medications Prior to Admission medications   Medication Sig Start Date End Date Taking? Authorizing Provider  amphetamine-dextroamphetamine (ADDERALL) 20 MG tablet TAKE ONE TABLET BY MOUTH TWICE A DAY 12/28/19   York Spaniel, MD  clonazePAM (KLONOPIN) 1 MG tablet TAKE 1/2 UP TO 1 TABLET BY MOUTH EACH BEDTIME 11/27/19   Sater, Pearletha Furl, MD  EMGALITY 120 MG/ML SOAJ INJECT 120 MG SUBCUTANEOUSLY ONCE EVERY 30 DAYS 11/09/19   Sater, Pearletha Furl, MD  natalizumab (TYSABRI) 300 MG/15ML injection Inject into the vein.    [provider]  Vitamin D, Ergocalciferol, (DRISDOL) 1.25 MG (50000 UNIT) CAPS capsule Take 1 capsule (50,000 Units total) by mouth every 7 (seven) days. 10/30/19   Sater, Pearletha Furl, MD    Allergies    Ciprofloxacin and Influenza vaccines  Review of Systems   Review of Systems  Constitutional: Positive for activity change, appetite change, chills, fatigue and fever.  HENT: Negative for ear pain, rhinorrhea, sneezing and sore throat.   Eyes: Negative for photophobia and visual disturbance.  Respiratory: Positive for chest tightness and shortness of breath. Negative for cough and wheezing.   Cardiovascular: Negative for chest pain and palpitations.  Gastrointestinal: Positive for nausea. Negative for abdominal pain, blood in stool, constipation, diarrhea and vomiting.  Genitourinary: Negative for dysuria, hematuria and urgency.  Musculoskeletal: Positive for myalgias.  Skin: Negative for rash.  Neurological: Negative for dizziness, weakness and light-headedness.    Physical Exam Updated Vital Signs BP 103/73   Pulse 76   Temp 97.7 F (36.5 C) (Oral)   Resp 17   Ht 5\' 8"  (1.727 m)   Wt 106.6 kg   SpO2 98%   BMI 35.73 kg/m   Physical Exam Vitals and nursing note reviewed.  Constitutional:      General: She is not in acute distress.    Appearance: She is well-developed.  HENT:     Head: Normocephalic and atraumatic.     Nose: Nose normal.  Eyes:     General: No scleral icterus.       Left eye: No discharge.     Conjunctiva/sclera: Conjunctivae normal.  Cardiovascular:     Rate and Rhythm: Normal rate and regular rhythm.     Heart sounds: Normal heart sounds. No murmur heard.  No friction rub. No gallop.   Pulmonary:     Effort: Pulmonary effort is normal. No respiratory distress.     Breath sounds: Normal breath sounds.  Abdominal:     General: Bowel sounds are normal. There is no distension.     Palpations: Abdomen is soft.     Tenderness: There is no abdominal  tenderness. There is no guarding.  Musculoskeletal:        General: Normal range of motion.     Cervical back: Normal range of motion and neck supple.  Skin:    General: Skin is warm and dry.     Findings: No rash.  Neurological:     Mental Status: She is alert.     Motor: No abnormal muscle tone.     Coordination: Coordination normal.     ED Results / Procedures / Treatments   Labs (all labs ordered are listed, but only abnormal results are displayed) Labs Reviewed - No data to display  EKG None  Radiology DG Chest Portable 1 View  Result Date: 01/25/2020 CLINICAL DATA:  Shortness of breath. COVID positive. Chest pain. Back pain. EXAM: PORTABLE CHEST 1  VIEW COMPARISON:  Prior chest radiographs 09/05/2019. FINDINGS: Heart size within normal limits. No appreciable airspace consolidation. No evidence of pleural effusion or pneumothorax. No acute bony abnormality identified. IMPRESSION: No evidence of active cardiopulmonary disease. Electronically Signed   By: Jackey Loge DO   On: 01/25/2020 16:34    Procedures Procedures (including critical care time)  Medications Ordered in ED Medications - No data to display  ED Course  I have reviewed the triage vital signs and the nursing notes.  Pertinent labs & imaging results that were available during my care of the patient were reviewed by me and considered in my medical decision making (see chart for details).  Clinical Course as of Jan 24 1805  Thu Jan 25, 2020  1730 EKG: NSR Rate 73 Normal intervals Normal ST/T waves No old to compare   [CS]    Clinical Course User Index [CS] Pollyann Savoy, MD   MDM Rules/Calculators/A&P                          Ltanya Parrie was evaluated in Emergency Department on 01/25/20 for the symptoms described in the history of present illness. He/she was evaluated in the context of the global COVID-19 pandemic, which necessitated consideration that the patient might be at risk for  infection with the SARS-CoV-2 virus that causes COVID-19. Institutional protocols and algorithms that pertain to the evaluation of patients at risk for COVID-19 are in a state of rapid change based on information released by regulatory bodies including the CDC and federal and state organizations. These policies and algorithms were followed during the patient's care in the ED.  34 year old female who tested positive for Covid on 01/21/2020 presenting to the ED for continued Covid symptoms.  Reports shortness of breath, chest tightness, cough, myalgias, fatigue, nausea and decreased appetite.  On exam lungs are clear to auscultation bilaterally.  Abdomen is soft, nontender nondistended.  Oxygen saturations are 98% and above on room air.  Chest x-ray without any acute findings.  EKG shows normal sinus rhythm, no ischemic changes. Offered patient fluids and symptomatic treatment here but she declines.  She is interested in a Mab infusion and because of her BMI of 35 she does qualify for this.  I have messaged the Mab infusion group through secure message and they will arrange her follow-up regarding this.  In the meantime we will have her continue supportive treatment at home, increase p.o. intake and follow-up with PCP.  Strict return precautions given.  All imaging, if done today, including plain films, CT scans, and ultrasounds, independently reviewed by me, and interpretations confirmed via formal radiology reads.  Patient is hemodynamically stable, in NAD, and able to ambulate in the ED. Evaluation does not show pathology that would require ongoing emergent intervention or inpatient treatment. I explained the diagnosis to the patient. Pain has been managed and has no complaints prior to discharge. Patient is comfortable with above plan and is stable for discharge at this time. All questions were answered prior to disposition. Strict return precautions for returning to the ED were discussed. Encouraged follow  up with PCP.   An After Visit Summary was printed and given to the patient.   Portions of this note were generated with Scientist, clinical (histocompatibility and immunogenetics). Dictation errors may occur despite best attempts at proofreading.  Final Clinical Impression(s) / ED Diagnoses Final diagnoses:  COVID-19 virus infection    Rx / DC Orders ED Discharge Orders  None       Dietrich Pates, PA-C 01/25/20 1806    Pollyann Savoy, MD 01/25/20 Kristopher Oppenheim

## 2020-01-25 NOTE — Discharge Instructions (Addendum)
You will be contacted regarding your Mab infusion if you qualify. Continue supportive treatment at home. Make sure you are drinking plenty of fluids. Return to the ER for chest pain, shortness of breath, leg swelling, coughing up blood, uncontrollable vomiting.

## 2020-01-25 NOTE — ED Triage Notes (Signed)
Dx with COVID yesterday. C/o SOB, chest pain, back pain.

## 2020-01-27 ENCOUNTER — Other Ambulatory Visit: Payer: Self-pay | Admitting: Unknown Physician Specialty

## 2020-01-27 ENCOUNTER — Telehealth: Payer: Self-pay | Admitting: Unknown Physician Specialty

## 2020-01-27 DIAGNOSIS — G35 Multiple sclerosis: Secondary | ICD-10-CM

## 2020-01-27 DIAGNOSIS — U071 COVID-19: Secondary | ICD-10-CM

## 2020-01-27 NOTE — Progress Notes (Signed)
Sx onset 9/12 

## 2020-01-27 NOTE — Telephone Encounter (Signed)
I connected by phone with Caitlin [name] on 01/27/2020 at 10:48 AM to discuss the potential use of a new treatment for mild to moderate COVID-19 viral infection in non-hospitalized patients.  This patient is a 34 y.o. female that meets the FDA criteria for Emergency Use Authorization of COVID monoclonal antibody casirivimab/imdevimab.  Has a (+) direct SARS-CoV-2 viral test result  Has mild or moderate COVID-19   Is NOT hospitalized due to COVID-19  Is within 10 days of symptom onset  Has at least one of the high risk factor(s) for progression to severe COVID-19 and/or hospitalization as defined in EUA.  Specific high risk criteria : Immunosuppressive Disease or Treatment   I have spoken and communicated the following to the patient or parent/caregiver regarding COVID monoclonal antibody treatment:  1. FDA has authorized the emergency use for the treatment of mild to moderate COVID-19 in adults and pediatric patients with positive results of direct SARS-CoV-2 viral testing who are 5 years of age and older weighing at least 40 kg, and who are at high risk for progressing to severe COVID-19 and/or hospitalization.  2. The significant known and potential risks and benefits of COVID monoclonal antibody, and the extent to which such potential risks and benefits are unknown.  3. Information on available alternative treatments and the risks and benefits of those alternatives, including clinical trials.  4. Patients treated with COVID monoclonal antibody should continue to self-isolate and use infection control measures (e.g., wear mask, isolate, social distance, avoid sharing personal items, clean and disinfect "high touch" surfaces, and frequent handwashing) according to CDC guidelines.   5. The patient or parent/caregiver has the option to accept or refuse COVID monoclonal antibody treatment.  After reviewing this information with the patient, The patient agreed to proceed with receiving  casirivimab\imdevimab infusion and will be provided a copy of the Fact sheet prior to receiving the infusion. [name] 01/27/2020 10:48 AM  Sx onset 9/12

## 2020-01-28 ENCOUNTER — Ambulatory Visit (HOSPITAL_COMMUNITY)
Admission: RE | Admit: 2020-01-28 | Discharge: 2020-01-28 | Disposition: A | Discharge status: Home / Self Care | Payer: Medicare Other | Source: Ambulatory Visit | Attending: Pulmonary Disease | Admitting: Pulmonary Disease

## 2020-01-28 DIAGNOSIS — Z23 Encounter for immunization: Secondary | ICD-10-CM | POA: Diagnosis not present

## 2020-01-28 DIAGNOSIS — G35 Multiple sclerosis: Secondary | ICD-10-CM | POA: Diagnosis present

## 2020-01-28 DIAGNOSIS — U071 COVID-19: Secondary | ICD-10-CM | POA: Insufficient documentation

## 2020-01-28 MED ORDER — FAMOTIDINE IN NACL 20-0.9 MG/50ML-% IV SOLN: 20.0000 mg | Freq: Once | INTRAVENOUS | Status: DC | PRN

## 2020-01-28 MED ORDER — SODIUM CHLORIDE 0.9 % IV SOLN
1200.0000 mg | Freq: Once | INTRAVENOUS | Status: AC
  Administered 2020-01-28: 1200 mg via INTRAVENOUS

## 2020-01-28 MED ORDER — EPINEPHRINE 0.3 MG/0.3ML IJ SOAJ: 0.3000 mg | Freq: Once | INTRAMUSCULAR | Status: DC | PRN

## 2020-01-28 MED ORDER — ALBUTEROL SULFATE HFA 108 (90 BASE) MCG/ACT IN AERS: 2.0000 | INHALATION_SPRAY | Freq: Once | RESPIRATORY_TRACT | Status: DC | PRN

## 2020-01-28 MED ORDER — ACETAMINOPHEN 325 MG PO TABS
650.0000 mg | ORAL_TABLET | Freq: Four times a day (QID) | ORAL | Status: DC | PRN
  Administered 2020-01-28: 650 mg via ORAL
  Filled 2020-01-28: qty 2

## 2020-01-28 MED ORDER — METHYLPREDNISOLONE SODIUM SUCC 125 MG IJ SOLR: 125.0000 mg | Freq: Once | INTRAMUSCULAR | Status: DC | PRN

## 2020-01-28 MED ORDER — DIPHENHYDRAMINE HCL 50 MG/ML IJ SOLN: 50.0000 mg | Freq: Once | INTRAMUSCULAR | Status: DC | PRN

## 2020-01-28 MED ORDER — SODIUM CHLORIDE 0.9 % IV SOLN: INTRAVENOUS | Status: DC | PRN

## 2020-01-28 NOTE — Discharge Instructions (Signed)

## 2020-01-28 NOTE — Progress Notes (Signed)
  Diagnosis: COVID-19  Physician: Dr. P Wright   Procedure: [facility] Infusion [facility] Med: casirivimab\imdevimab infusion - Provided patient with casirivimab\imdevimab fact sheet for patients, parents and caregivers prior to infusion.  Complications: No immediate complications noted.  Discharge: Discharged home   Caitlin [name] 01/28/2020   

## 2020-01-29 ENCOUNTER — Encounter (HOSPITAL_COMMUNITY): Payer: Medicare Other

## 2020-01-30 ENCOUNTER — Other Ambulatory Visit: Payer: Self-pay | Admitting: Neurology

## 2020-02-07 ENCOUNTER — Telehealth: Payer: Self-pay | Admitting: Neurology

## 2020-02-07 NOTE — Telephone Encounter (Signed)
I cannot answer your question- I suggest to postpone the Tysabri infusion by one week to gain a time distance between the MAB infusion for Covid treatment and the Tysabri therapy.  This would place you at October 10th , and this would allow Dr. Epimenio Foot to have any input( he is not in the office this week).  Kara Mead, can you communicate with infusion, please?  Melvyn Novas, MD

## 2020-02-07 NOTE — Telephone Encounter (Signed)
Sent Dr. Kathe Mariner message to review tomorrow upon his return for further recommendation on this.

## 2020-02-13 ENCOUNTER — Ambulatory Visit (HOSPITAL_COMMUNITY)
Admission: RE | Admit: 2020-02-13 | Discharge: 2020-02-13 | Disposition: A | Discharge status: Home / Self Care | Payer: Medicare Other | Source: Ambulatory Visit | Attending: Internal Medicine | Admitting: Internal Medicine

## 2020-02-13 ENCOUNTER — Other Ambulatory Visit: Payer: Self-pay

## 2020-02-13 DIAGNOSIS — G35 Multiple sclerosis: Secondary | ICD-10-CM | POA: Insufficient documentation

## 2020-02-13 MED ORDER — SODIUM CHLORIDE 0.9 % IV SOLN
300.0000 mg | INTRAVENOUS | Status: DC
  Administered 2020-02-13: 300 mg via INTRAVENOUS
  Filled 2020-02-13: qty 15

## 2020-02-13 MED ORDER — SODIUM CHLORIDE 0.9 % IV SOLN
INTRAVENOUS | Status: DC | PRN
  Administered 2020-02-13: 250 mL via INTRAVENOUS

## 2020-02-13 NOTE — Discharge Instructions (Signed)
Natalizumab injection What is this medicine? NATALIZUMAB (na ta LIZ you mab) is used to treat relapsing multiple sclerosis. This drug is not a cure. It is also used to treat Crohn's disease. This medicine may be used for other purposes; ask your health care provider or pharmacist if you have questions. COMMON BRAND NAME(S): Tysabri What should I tell my health care provider before I take this medicine? They need to know if you have any of these conditions:  immune system problems  progressive multifocal leukoencephalopathy (PML)  an unusual or allergic reaction to natalizumab, other medicines, foods, dyes, or preservatives  pregnant or trying to get pregnant  breast-feeding How should I use this medicine? This medicine is for infusion into a vein. It is given by a health care professional in a hospital or clinic setting. A special MedGuide will be given to you by the pharmacist with each prescription and refill. Be sure to read this information carefully each time. Talk to your pediatrician regarding the use of this medicine in children. This medicine is not approved for use in children. Overdosage: If you think you have taken too much of this medicine contact a poison control center or emergency room at once. NOTE: This medicine is only for you. Do not share this medicine with others. What if I miss a dose? It is important not to miss your dose. Call your doctor or health care professional if you are unable to keep an appointment. What may interact with this medicine? Do not take this medicine with any of the following medications:  biologic medicines such as adalimumab, certolizumab, etanercept, golimumab, infliximab This medicine may also interact with the following medications:  azathioprine  cyclosporine  interferons  6-mercaptopurine  methotrexate  other medicines that lower your chance of fighting an infection  steroid medicines like prednisone or  cortisone  vaccines This list may not describe all possible interactions. Give your health care provider a list of all the medicines, herbs, non-prescription drugs, or dietary supplements you use. Also tell them if you smoke, drink alcohol, or use illegal drugs. Some items may interact with your medicine. What should I watch for while using this medicine? Your condition will be monitored carefully while you are receiving this medicine. Visit your doctor for regular check ups. Tell your doctor or healthcare professional if your symptoms do not start to get better or if they get worse. Stay away from people who are sick. Call your doctor or health care professional for advice if you get a fever, chills or sore throat, or other symptoms of a cold or flu. Do not treat yourself. In some patients, this medicine may cause a serious brain infection that may cause death. If you have any problems seeing, thinking, speaking, walking, or standing, tell your doctor right away. If you cannot reach your doctor, get urgent medical care. What side effects may I notice from receiving this medicine? Side effects that you should report to your doctor or health care professional as soon as possible:  allergic reactions like skin rash, itching or hives, swelling of the face, lips, or tongue  breathing problems  changes in vision  chest pain  confusion  depressed mood  dizziness  feeling faint; lightheaded; falls  general ill feeling or flu-like symptoms  loss of memory  missed menstrual periods  muscle weakness  problems with balance, talking, or walking  signs and symptoms of liver injury like dark yellow or brown urine; general ill feeling or flu-like symptoms; light-colored   stools; loss of appetite; nausea; right upper belly pain; unusually weak or tired; yellowing of the eyes or skin  suicidal thoughts, mood changes  unusual bruising or bleeding  unusually weak or tired Side effects that  usually do not require medical attention (report to your doctor or health care professional if they continue or are bothersome):  headache  joint pain  muscle cramps  muscle pain  nausea, vomiting  pain, redness, or irritation at site where injected  tiredness This list may not describe all possible side effects. Call your doctor for medical advice about side effects. You may report side effects to FDA at 1-800-FDA-1088. Where should I keep my medicine? This drug is given in a hospital or clinic and will not be stored at home. NOTE: This sheet is a summary. It may not cover all possible information. If you have questions about this medicine, talk to your doctor, pharmacist, or health care provider.  2020 Elsevier/Gold Standard (2018-10-31 13:20:26)  

## 2020-02-13 NOTE — Progress Notes (Addendum)
Patient received IV Tysabri as ordered by Despina Arias, MD. Patient stated per her Physician it was okay to get Tysabri infused today even though she recently received MAT ("over one week"). Patient declined the 60 minutes post infusion observation.Tolerated well, vitals stable, discharge instructions given, verbalized understanding. Patient alert, oriented and ambulatory at the time of discharge.

## 2020-03-01 ENCOUNTER — Other Ambulatory Visit: Payer: Self-pay | Admitting: Neurology

## 2020-03-01 NOTE — Telephone Encounter (Signed)
Per [place] registry, Rx last refilled 01/30/20. Pt has upcoming appt with Amy NP. Will send to work-in MD to approve.

## 2020-03-12 ENCOUNTER — Other Ambulatory Visit: Payer: Self-pay

## 2020-03-12 ENCOUNTER — Ambulatory Visit (HOSPITAL_COMMUNITY)
Admission: RE | Admit: 2020-03-12 | Discharge: 2020-03-12 | Disposition: A | Discharge status: Home / Self Care | Payer: Medicare Other | Source: Ambulatory Visit | Attending: Internal Medicine | Admitting: Internal Medicine

## 2020-03-12 DIAGNOSIS — G35 Multiple sclerosis: Secondary | ICD-10-CM | POA: Insufficient documentation

## 2020-03-12 MED ORDER — SODIUM CHLORIDE 0.9 % IV SOLN
300.0000 mg | INTRAVENOUS | Status: DC
  Administered 2020-03-12: 300 mg via INTRAVENOUS
  Filled 2020-03-12: qty 15

## 2020-03-12 MED ORDER — SODIUM CHLORIDE 0.9 % IV SOLN
INTRAVENOUS | Status: DC | PRN
  Administered 2020-03-12: 250 mL via INTRAVENOUS

## 2020-03-12 NOTE — Progress Notes (Signed)
PATIENT CARE CENTER NOTE  Diagnosis:Multiple Sclerosis (G35)   Provider:Sater, Richard, MD   Procedure:Tysabri infusion   Note:Patient received Tysabri infusion via PIV. Tolerated well with no adverse reaction. Vital signs stable. Patientdeclinedto wait for 1 hour post-infusion. Discharge instructions given. Patient alert, oriented and ambulatory at discharge. 

## 2020-03-12 NOTE — Discharge Instructions (Signed)
Natalizumab injection What is this medicine? NATALIZUMAB (na ta LIZ you mab) is used to treat relapsing multiple sclerosis. This drug is not a cure. It is also used to treat Crohn's disease. This medicine may be used for other purposes; ask your health care provider or pharmacist if you have questions. COMMON BRAND NAME(S): Tysabri What should I tell my health care provider before I take this medicine? They need to know if you have any of these conditions:  immune system problems  progressive multifocal leukoencephalopathy (PML)  an unusual or allergic reaction to natalizumab, other medicines, foods, dyes, or preservatives  pregnant or trying to get pregnant  breast-feeding How should I use this medicine? This medicine is for infusion into a vein. It is given by a health care professional in a hospital or clinic setting. A special MedGuide will be given to you by the pharmacist with each prescription and refill. Be sure to read this information carefully each time. Talk to your pediatrician regarding the use of this medicine in children. This medicine is not approved for use in children. Overdosage: If you think you have taken too much of this medicine contact a poison control center or emergency room at once. NOTE: This medicine is only for you. Do not share this medicine with others. What if I miss a dose? It is important not to miss your dose. Call your doctor or health care professional if you are unable to keep an appointment. What may interact with this medicine? Do not take this medicine with any of the following medications:  biologic medicines such as adalimumab, certolizumab, etanercept, golimumab, infliximab This medicine may also interact with the following medications:  azathioprine  cyclosporine  interferons  6-mercaptopurine  methotrexate  other medicines that lower your chance of fighting an infection  steroid medicines like prednisone or  cortisone  vaccines This list may not describe all possible interactions. Give your health care provider a list of all the medicines, herbs, non-prescription drugs, or dietary supplements you use. Also tell them if you smoke, drink alcohol, or use illegal drugs. Some items may interact with your medicine. What should I watch for while using this medicine? Your condition will be monitored carefully while you are receiving this medicine. Visit your doctor for regular check ups. Tell your doctor or healthcare professional if your symptoms do not start to get better or if they get worse. Stay away from people who are sick. Call your doctor or health care professional for advice if you get a fever, chills or sore throat, or other symptoms of a cold or flu. Do not treat yourself. In some patients, this medicine may cause a serious brain infection that may cause death. If you have any problems seeing, thinking, speaking, walking, or standing, tell your doctor right away. If you cannot reach your doctor, get urgent medical care. What side effects may I notice from receiving this medicine? Side effects that you should report to your doctor or health care professional as soon as possible:  allergic reactions like skin rash, itching or hives, swelling of the face, lips, or tongue  breathing problems  changes in vision  chest pain  confusion  depressed mood  dizziness  feeling faint; lightheaded; falls  general ill feeling or flu-like symptoms  loss of memory  missed menstrual periods  muscle weakness  problems with balance, talking, or walking  signs and symptoms of liver injury like dark yellow or brown urine; general ill feeling or flu-like symptoms; light-colored   stools; loss of appetite; nausea; right upper belly pain; unusually weak or tired; yellowing of the eyes or skin  suicidal thoughts, mood changes  unusual bruising or bleeding  unusually weak or tired Side effects that  usually do not require medical attention (report to your doctor or health care professional if they continue or are bothersome):  headache  joint pain  muscle cramps  muscle pain  nausea, vomiting  pain, redness, or irritation at site where injected  tiredness This list may not describe all possible side effects. Call your doctor for medical advice about side effects. You may report side effects to FDA at 1-800-FDA-1088. Where should I keep my medicine? This drug is given in a hospital or clinic and will not be stored at home. NOTE: This sheet is a summary. It may not cover all possible information. If you have questions about this medicine, talk to your doctor, pharmacist, or health care provider.  2020 Elsevier/Gold Standard (2018-10-31 13:20:26)  

## 2020-03-25 ENCOUNTER — Telehealth: Payer: Self-pay | Admitting: Neurology

## 2020-03-25 NOTE — Telephone Encounter (Signed)
I called and spoke with pt. Relayed recommendation. She has been taking OTC NSAIDS, ineffective. She will contact PCP.

## 2020-03-25 NOTE — Telephone Encounter (Signed)
Called and spoke with pt. She has had right foot pain x1 week that has been progressively getting worse. She is unable to walk on it, has burning sensation. Denies starting any new meds recently or any signs of infection. Last Tysabri 03/12/20. Advised I will speak with MD once he gets to office and call back with recommendation.

## 2020-03-25 NOTE — Telephone Encounter (Signed)
Per Dr. Epimenio Foot, since pain localized to right foot only, unlikely to be MS. She should try OTC NSAIDS. If ineffective, she should f/u with PCP or podiatry for further evaluation.

## 2020-03-25 NOTE — Telephone Encounter (Signed)
Pt called stating that her foot has been hurting and is needing to speak to the RN to see if she will need a steroid injection. Please advise.

## 2020-04-01 ENCOUNTER — Other Ambulatory Visit: Payer: Self-pay | Admitting: Neurology

## 2020-04-09 ENCOUNTER — Ambulatory Visit (HOSPITAL_COMMUNITY)
Admission: RE | Admit: 2020-04-09 | Discharge: 2020-04-09 | Disposition: A | Discharge status: Home / Self Care | Payer: Medicare Other | Source: Ambulatory Visit | Attending: Internal Medicine | Admitting: Internal Medicine

## 2020-04-09 ENCOUNTER — Other Ambulatory Visit: Payer: Self-pay

## 2020-04-09 DIAGNOSIS — G35 Multiple sclerosis: Secondary | ICD-10-CM | POA: Diagnosis not present

## 2020-04-09 MED ORDER — SODIUM CHLORIDE 0.9 % IV SOLN
INTRAVENOUS | Status: DC | PRN
  Administered 2020-04-09: 250 mL via INTRAVENOUS

## 2020-04-09 MED ORDER — SODIUM CHLORIDE 0.9 % IV SOLN
300.0000 mg | INTRAVENOUS | Status: DC
  Administered 2020-04-09: 300 mg via INTRAVENOUS
  Filled 2020-04-09: qty 15

## 2020-04-09 NOTE — Discharge Instructions (Signed)
Natalizumab injection What is this medicine? NATALIZUMAB (na ta LIZ you mab) is used to treat relapsing multiple sclerosis. This drug is not a cure. It is also used to treat Crohn's disease. This medicine may be used for other purposes; ask your health care provider or pharmacist if you have questions. COMMON BRAND NAME(S): Tysabri What should I tell my health care provider before I take this medicine? They need to know if you have any of these conditions:  immune system problems  progressive multifocal leukoencephalopathy (PML)  an unusual or allergic reaction to natalizumab, other medicines, foods, dyes, or preservatives  pregnant or trying to get pregnant  breast-feeding How should I use this medicine? This medicine is for infusion into a vein. It is given by a health care professional in a hospital or clinic setting. A special MedGuide will be given to you by the pharmacist with each prescription and refill. Be sure to read this information carefully each time. Talk to your pediatrician regarding the use of this medicine in children. This medicine is not approved for use in children. Overdosage: If you think you have taken too much of this medicine contact a poison control center or emergency room at once. NOTE: This medicine is only for you. Do not share this medicine with others. What if I miss a dose? It is important not to miss your dose. Call your doctor or health care professional if you are unable to keep an appointment. What may interact with this medicine? Do not take this medicine with any of the following medications:  biologic medicines such as adalimumab, certolizumab, etanercept, golimumab, infliximab This medicine may also interact with the following medications:  azathioprine  cyclosporine  interferons  6-mercaptopurine  methotrexate  other medicines that lower your chance of fighting an infection  steroid medicines like prednisone or  cortisone  vaccines This list may not describe all possible interactions. Give your health care provider a list of all the medicines, herbs, non-prescription drugs, or dietary supplements you use. Also tell them if you smoke, drink alcohol, or use illegal drugs. Some items may interact with your medicine. What should I watch for while using this medicine? Your condition will be monitored carefully while you are receiving this medicine. Visit your doctor for regular check ups. Tell your doctor or healthcare professional if your symptoms do not start to get better or if they get worse. Stay away from people who are sick. Call your doctor or health care professional for advice if you get a fever, chills or sore throat, or other symptoms of a cold or flu. Do not treat yourself. In some patients, this medicine may cause a serious brain infection that may cause death. If you have any problems seeing, thinking, speaking, walking, or standing, tell your doctor right away. If you cannot reach your doctor, get urgent medical care. What side effects may I notice from receiving this medicine? Side effects that you should report to your doctor or health care professional as soon as possible:  allergic reactions like skin rash, itching or hives, swelling of the face, lips, or tongue  breathing problems  changes in vision  chest pain  confusion  depressed mood  dizziness  feeling faint; lightheaded; falls  general ill feeling or flu-like symptoms  loss of memory  missed menstrual periods  muscle weakness  problems with balance, talking, or walking  signs and symptoms of liver injury like dark yellow or brown urine; general ill feeling or flu-like symptoms; light-colored   stools; loss of appetite; nausea; right upper belly pain; unusually weak or tired; yellowing of the eyes or skin  suicidal thoughts, mood changes  unusual bruising or bleeding  unusually weak or tired Side effects that  usually do not require medical attention (report to your doctor or health care professional if they continue or are bothersome):  headache  joint pain  muscle cramps  muscle pain  nausea, vomiting  pain, redness, or irritation at site where injected  tiredness This list may not describe all possible side effects. Call your doctor for medical advice about side effects. You may report side effects to FDA at 1-800-FDA-1088. Where should I keep my medicine? This drug is given in a hospital or clinic and will not be stored at home. NOTE: This sheet is a summary. It may not cover all possible information. If you have questions about this medicine, talk to your doctor, pharmacist, or health care provider.  2020 Elsevier/Gold Standard (2018-10-31 13:20:26)  

## 2020-04-09 NOTE — Progress Notes (Signed)
PATIENT CARE CENTER NOTE  Diagnosis:Multiple Sclerosis (G35)   Provider:Sater, Richard, MD   Procedure:Tysabri infusion   Note:Patient received Tysabri infusion via PIV. Tolerated well with no adverse reaction. Vital signs stable. Patientdeclinedto wait for 1 hour post-infusion. Discharge instructions given. Patient alert, oriented and ambulatory at discharge.

## 2020-04-23 ENCOUNTER — Ambulatory Visit: Payer: Medicare Other | Admitting: Family Medicine

## 2020-04-23 ENCOUNTER — Encounter: Payer: Self-pay | Admitting: Family Medicine

## 2020-04-23 VITALS — BP 121/75 | HR 85 | Ht 68.0 in | Wt 255.0 lb

## 2020-04-23 DIAGNOSIS — R5383 Other fatigue: Secondary | ICD-10-CM

## 2020-04-23 DIAGNOSIS — G4700 Insomnia, unspecified: Secondary | ICD-10-CM | POA: Diagnosis not present

## 2020-04-23 DIAGNOSIS — G2581 Restless legs syndrome: Secondary | ICD-10-CM

## 2020-04-23 DIAGNOSIS — R269 Unspecified abnormalities of gait and mobility: Secondary | ICD-10-CM

## 2020-04-23 DIAGNOSIS — Z79899 Other long term (current) drug therapy: Secondary | ICD-10-CM | POA: Diagnosis not present

## 2020-04-23 DIAGNOSIS — E559 Vitamin D deficiency, unspecified: Secondary | ICD-10-CM

## 2020-04-23 DIAGNOSIS — G35 Multiple sclerosis: Secondary | ICD-10-CM

## 2020-04-23 DIAGNOSIS — R339 Retention of urine, unspecified: Secondary | ICD-10-CM

## 2020-04-23 DIAGNOSIS — M21372 Foot drop, left foot: Secondary | ICD-10-CM

## 2020-04-23 NOTE — Patient Instructions (Signed)
°Below is our plan: ° °We will continue current treatment plan.  ° °Please make sure you are staying well hydrated. I recommend 50-60 ounces daily. Well balanced diet and regular exercise encouraged.  ° ° °Please continue follow up with care team as directed.  ° °Follow up with Dr Sater in 6 months  ° °You may receive a survey regarding today's visit. I encourage you to leave honest feed back as I do use this information to improve patient care. Thank you for seeing me today!  ° ° ° ° °Multiple Sclerosis °Multiple sclerosis (MS) is a disease of the brain, spinal cord, and optic nerves (central nervous system). It causes the body's disease-fighting (immune) system to destroy the protective covering (myelin sheath) around nerves in the brain. When this happens, signals (nerve impulses) going to and from the brain and spinal cord do not get sent properly or may not get sent at all. °There are several types of MS: °· Relapsing-remitting MS. This is the most common type. This causes sudden attacks of symptoms. After an attack, you may recover completely until the next attack, or some symptoms may remain permanently. °· Secondary progressive MS. This usually develops after the onset of relapsing-remitting MS. Similar to relapsing-remitting MS, this type also causes sudden attacks of symptoms. Attacks may be less frequent, but symptoms slowly get worse (progress) over time. °· Primary progressive MS. This causes symptoms that steadily progress over time. This type of MS does not cause sudden attacks of symptoms. °The age of onset of MS varies, but it often develops between 20-40 years of age. MS is a lifelong (chronic) condition. There is no cure, but treatment can help slow down the progression of the disease. °What are the causes? °The cause of this condition is not known. °What increases the risk? °You are more likely to develop this condition if: °· You are a woman. °· You have a relative with MS. However, the  condition is not passed from parent to child (inherited). °· You have a lack (deficiency) of vitamin D. °· You smoke. °MS is more common in the [place] than in the southern [place]. °What are the signs or symptoms? °Relapsing-remitting and secondary progressive MS cause symptoms to occur in episodes or attacks that may last weeks to months. There may be long periods between attacks in which there are almost no symptoms. Primary progressive MS causes symptoms to steadily progress after they develop. °Symptoms of MS vary because of the many different ways it affects the central nervous system. The main symptoms include: °· Vision problems and eye pain. °· Numbness. °· Weakness. °· Inability to move your arms, hands, feet, or legs (paralysis). °· Balance problems. °· Shaking that you cannot control (tremors). °· Muscle spasms. °· Problems with thinking (cognitive changes). °MS can also cause symptoms that are associated with the disease, but are not always the direct result of an MS attack. They may include: °· Inability to control urination or bowel movements (incontinence). °· Headaches. °· Fatigue. °· Inability to tolerate heat. °· Emotional changes. °· Depression. °· Pain. °How is this diagnosed? °This condition is diagnosed based on: °· Your symptoms. °· A neurological exam. This involves checking central nervous system function, such as nerve function, reflexes, and coordination. °· MRIs of the brain and spinal cord. °· Lab tests, including a lumbar puncture that tests the fluid that surrounds the brain and spinal cord (cerebrospinal fluid). °· Tests to measure the electrical activity of the brain   in response to stimulation (evoked potentials). °How is this treated? °There is no cure for MS, but medicines can help decrease the number and frequency of attacks and help relieve nuisance symptoms. Treatment options may include: °· Medicines that reduce the frequency of attacks. These medicines  may be given by injection, by mouth (orally), or through an IV. °· Medicines that reduce inflammation (steroids). These may provide short-term relief of symptoms. °· Medicines to help control pain, depression, fatigue, or incontinence. °· Vitamin D, if you have a deficiency. °· Using devices to help you move around (assistive devices), such as braces, a cane, or a walker. °· Physical therapy to strengthen and stretch your muscles. °· Occupational therapy to help you with everyday tasks. °· Alternative or complementary treatments such as exercise, massage, or acupuncture. °Follow these instructions at home: °· Take over-the-counter and prescription medicines only as told by your health care provider. °· Do not drive or use heavy machinery while taking prescription pain medicine. °· Use assistive devices as recommended by your physical therapist or your health care provider. °· Exercise as directed by your health care provider. °· Return to your normal activities as told by your health care provider. Ask your health care provider what activities are safe for you. °· Reach out for support. Share your feelings with friends, family, or a support group. °· Keep all follow-up visits as told by your health care provider and therapists. This is important. °Where to find more information °· National Multiple Sclerosis Society: https://www.nationalmssociety.org °Contact a health care provider if: °· You feel depressed. °· You develop new pain or numbness. °· You have tremors. °· You have problems with sexual function. °Get help right away if: °· You develop paralysis. °· You develop numbness. °· You have problems with your bladder or bowel function. °· You develop double vision. °· You lose vision in one or both eyes. °· You develop suicidal thoughts. °· You develop severe confusion. °If you ever feel like you may hurt yourself or others, or have thoughts about taking your own life, get help right away. You can go to your  nearest emergency department or call: °· Your local emergency services (911 in the [place] °· A suicide crisis helpline, such as the National Suicide Prevention Lifeline at 1-800-273-8255. This is open 24 hours a day. °Summary °· Multiple sclerosis (MS) is a disease of the central nervous system that causes the body's immune system to destroy the protective covering (myelin sheath) around nerves in the brain. °· There are 3 types of MS: relapsing-remitting, secondary progressive, and primary progressive. Relapsing-remitting and secondary progressive MS cause symptoms to occur in episodes or attacks that may last weeks to months. Primary progressive MS causes symptoms to steadily progress after they develop. °· There is no cure for MS, but medicines can help decrease the number and frequency of attacks and help relieve nuisance symptoms. Treatment may also include physical or occupational therapy. °· If you develop numbness, paralysis, vision problems, or other neurological symptoms, get help right away. °This information is not intended to replace advice given to you by your health care provider. Make sure you discuss any questions you have with your health care provider. °Document Revised: 04/09/2017 Document Reviewed: 07/06/2016 °Elsevier Patient Education © 2020 [employer] ° °

## 2020-04-23 NOTE — Progress Notes (Signed)
I have read the note, and I agree with the clinical assessment and plan.  Daylynn Stumpp A. Sulo Janczak, MD, PhD, FAAN Certified in Neurology, Clinical Neurophysiology, Sleep Medicine, Pain Medicine and Neuroimaging  [facility] [address], [address] [place], [place] [address] [phone]  

## 2020-04-23 NOTE — Progress Notes (Signed)
Chief Complaint  Patient presents with  . Follow-up    RM 1  . Multiple Sclerosis    Pt said she has a spell of neuropathy in the right foot 1 month ago but it went away.     HISTORY OF PRESENT ILLNESS: Today 04/23/20  Caitlin [name] is a 34 y.o. female here today for follow up for  RRMS. She continues Tysabri infusions. Last infusion was 11/30. She is tolerating well. Last MRI stable in 12/2018.   She denies new or exacerbating symptoms. She did have right sided foot pain for about a month but symptoms resolved. No changes in gait.   She continues Emgality for migraine management. Ibuprofen 800mg  helps with abortive therapy. She also continues Adderall 20mg  twice daily for inattention and MS fatigue. She continues clonazepam 1mg  at bedtime for RLS and sleep.   She has recurrent UTIs. She was treated with abx and symptoms resolved. She does have urinary retention. Flomax has not helped.    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      Update 04/24/2019: She feels her MS has been stable.   She is on Tysabri and tolerates it well.   Her last JCV antibody was checked 04/24/2019 and was negative.  She denies any change in sensation, strength or gait.  She gets occasional left leg dysesthesias..   No falls.   Bladder function is doing ok with frequent UTI.  Flomax did not help.  Vision is stable now though she had some OD blurriness earlier this year.       She has fatigue, better with Adderall.  Sleep is much better and RLS has improved since starting clonazepam at bedtime.      Migraines are better on Emgality.  She has had only a few bad migraines the past 6 months. She takes NSAIDs, often with benefit if one occurs.    Triptans had not helped.   Laying in a dark room helped also.    Sleeping helps.    PDMP was reviewed and she has good compliance and only gets benxo's/stimulants medications form 31.   She had  one hydrocodone script from outside for cough    Summary of symptoms from 12/01/2016:  MS:   She was diagnosed with MS in 2016 after presenting with left optic neuritis.  A couple years earlier, she had had possible transverse myelitis with leg weakness.  She was started on Tysabri as a DMT March 2016.    Despite a lot of symptoms, she has a very low plaque burden on the brain MRI and no lesions have been seen in her spine.   MS was diagnosed after an episode of optic neuritis and having a couple small non-specific foci on brain MRI.    A couple years earlier she had a possible transverse myelitis (vs mild GBS).        MRI of the cervical spine 03/02/2016 was essentially normal. There was no evidence of cervical myelopathy and just disc bulge at 2 levels.  The MRI of the brain 06/01/2016 is unchanged compared to the MRI of the brain 01/31/2016 and showed no acute findings.     Pain:  She reports being in a lot more pain and is sitting in a wheelchair today.   Her worse pain is in her hands and legs and neck into her shoulders.   She does not feel she is getting any  benefit from her medications.   She had lumbar surgery at L5S1 April 2017.     PseudoseizuresShe went to the [facility] ED 09[facility]09/[facility] and was felt to be having pseudoseizures and was d/c home.   Earlier in the year, she was hospitalized and had EEG during one of the spells c/w pseudoseizures.    Of note, she is on lamotrigine and Topamax for pain and psych issues.     Mood:  She has anxiety and depression. She has pseudoseizures.   She is on Cymbalta (10 mg at bedtime) without much benefit.   She was seen once by psychiatry in the hospital.   She sees Behavioral Health at [facility] every 2-3 weeks.   .     Gait/strength/numbness:  She reports poor balance and drags the left foot. She reports falling several times.    She notes weakness in the left foot and she feels there ia also mild weakness of the left hand.     She is doing therapy twice  weekly  Bladder/bowel: She feels bowel and bladder are doing well.  Vision:   She had right optic neuritis.   Acuity improved but bright lights bother that eye.   Colors are mildly desaturated out of right eye.    She notes reduced right peripheral vision.      Fatigue/sleep:  She reports a lot of fatigue despite 20 mg x 2 Adderall daily.     She falls asleep well with clonazepam 1 mg and trazodone 100 mg but still has sleep maintenance issues.        REVIEW OF SYSTEMS: Out of a complete 14 system review of symptoms, the patient complains only of the following symptoms, gait disturbance, headaches, inattention, fatigue, and all other reviewed systems are negative.   ALLERGIES: Allergies  Allergen Reactions  . Ciprofloxacin Anaphylaxis  . Influenza Vaccines Other (See Comments)    Hx of Guillain Barre     HOME MEDICATIONS: Outpatient Medications Prior to Visit  Medication Sig Dispense Refill  . amphetamine-dextroamphetamine (ADDERALL) 20 MG tablet TAKE ONE TABLET BY MOUTH TWICE A DAY 60 tablet 0  . cholecalciferol (VITAMIN D3) 25 MCG (1000 UNIT) tablet Take 1,000 Units by mouth daily.    . clonazePAM (KLONOPIN) 1 MG tablet TAKE 1/2 UP TO 1 TABLET BY MOUTH EACH BEDTIME 30 tablet 5  . EMGALITY 120 MG/ML SOAJ INJECT 120 MG SUBCUTANEOUSLY ONCE EVERY 30 DAYS 3 mL 4  . natalizumab (TYSABRI) 300 MG/15ML injection Inject into the vein.    . Vitamin D, Ergocalciferol, (DRISDOL) 1.25 MG (50000 UNIT) CAPS capsule Take 1 capsule (50,000 Units total) by mouth every 7 (seven) days. 13 capsule 3  . methylPREDNISolone sodium succinate (SOLU-MEDROL) 1,000 mg in sodium chloride 0.9 % 100 mL IVPB     . methylPREDNISolone sodium succinate (SOLU-MEDROL) 1,000 mg in sodium chloride 0.9 % 100 mL IVPB      No facility-administered medications prior to visit.     PAST MEDICAL HISTORY: Past Medical History:  Diagnosis Date  . Back pain   . Guillain Barr syndrome (HCC)   . Headache   . MS  (multiple sclerosis) (HCC)   . Pseudoseizures (HCC)   . Vision abnormalities      PAST SURGICAL HISTORY: Past Surgical History:  Procedure Laterality Date  . BACK SURGERY    . CESAREAN SECTION  2012  . CHOLECYSTECTOMY, LAPAROSCOPIC  2004  . KNEE SURGERY    . LUMBAR LAMINECTOMY    . VAGINAL  HYSTERECTOMY       FAMILY HISTORY: Family History  Problem Relation Age of Onset  . Diabetes type II Mother   . Hypertension Mother   . Fibromyalgia Mother   . Cardiomyopathy Father   . Heart attack Father      SOCIAL HISTORY: Social History   Socioeconomic History  . Marital status: Married    Spouse name: Not on file  . Number of children: Not on file  . Years of education: Not on file  . Highest education level: Not on file  Occupational History  . Not on file  Tobacco Use  . Smoking status: Former Smoker    Packs/day: 1.00    Types: Cigarettes  . Smokeless tobacco: Never Used  Vaping Use  . Vaping Use: Every day  . Substances: Nicotine, Flavoring  Substance and Sexual Activity  . Alcohol use: No    Alcohol/week: 0.0 standard drinks  . Drug use: No  . Sexual activity: Not on file  Other Topics Concern  . Not on file  Social History Narrative  . Not on file   Social Determinants of Health   Financial Resource Strain: Not on file  Food Insecurity: Not on file  Transportation Needs: Not on file  Physical Activity: Not on file  Stress: Not on file  Social Connections: Not on file  Intimate Partner Violence: Not on file      PHYSICAL EXAM  Vitals:   04/23/20 1015  BP: 121/75  Pulse: 85  Weight: 255 lb (115.7 kg)  Height: 5\' 8"  (1.727 m)   Body mass index is 38.77 kg/m.   Generalized: Well developed, in no acute distress  Cardiology: normal rate and rhythm, no murmur auscultated  Respiratory: clear to auscultation bilaterally    Neurological examination  Mentation: Alert oriented to time, place, history taking. Follows all commands speech and  language fluent Cranial nerve II-XII: Pupils were equal round reactive to light. Extraocular movements were full, visual field were full on confrontational test. Facial sensation and strength were normal. Uvula tongue midline. Head turning and shoulder shrug  were normal and symmetric. Motor: The motor testing reveals 5 over 5 strength of all 4 extremities. Good symmetric motor tone is noted throughout.  Sensory: Sensory testing is intact to soft touch on all 4 extremities. No evidence of extinction is noted.  Coordination: Cerebellar testing reveals good finger-nose-finger and heel-to-shin bilaterally.  Gait and station: Gait is arthritic, wide, slight left foot drop  Reflexes: Deep tendon reflexes are symmetric and normal bilaterally.     DIAGNOSTIC DATA (LABS, IMAGING, TESTING) - I reviewed patient records, labs, notes, testing and imaging myself where available.  Lab Results  Component Value Date   WBC 7.6 10/23/2019   HGB 13.9 10/23/2019   HCT 41.4 10/23/2019   MCV 90 10/23/2019   PLT 229 10/23/2019      Component Value Date/Time   NA 138 06/11/2018 2231   NA 140 06/20/2014 1202   K 3.4 (L) 06/11/2018 2231   CL 108 06/11/2018 2231   CO2 25 06/11/2018 2231   GLUCOSE 105 (H) 06/11/2018 2231   BUN 12 06/11/2018 2231   BUN 11 06/20/2014 1202   CREATININE 0.72 06/11/2018 2231   CALCIUM 9.1 06/11/2018 2231   PROT 6.5 09/25/2016 1047   ALBUMIN 4.3 09/25/2016 1047   AST 18 09/25/2016 1047   ALT 14 09/25/2016 1047   ALKPHOS 62 09/25/2016 1047   BILITOT 0.3 09/25/2016 1047   GFRNONAA >60 06/11/2018 2231  GFRAA >60 06/11/2018 2231   No results found for: CHOL, HDL, LDLCALC, LDLDIRECT, TRIG, CHOLHDL No results found for: FVCB4W No results found for: VITAMINB12 No results found for: TSH  No flowsheet data found.   ASSESSMENT AND PLAN  34 y.o. year old female  has a past medical history of Back pain, Guillain Barr syndrome (HCC), Headache, MS (multiple sclerosis) (HCC),  Pseudoseizures (HCC), and Vision abnormalities. here with   Multiple sclerosis (HCC) - Plan: CBC with Differential/Platelets, Stratify JCV Ab (w/ Index) w/ Rflx  Other fatigue  Insomnia, unspecified type  High risk medication use - Plan: CBC with Differential/Platelets, Stratify JCV Ab (w/ Index) w/ Rflx  Vitamin D deficiency - Plan: Vitamin D, 25-hydroxy  Restless leg syndrome  Left foot drop  Gait disturbance  Incomplete bladder emptying    Orders Placed This Encounter  Procedures  . Vitamin D, 25-hydroxy  . CBC with Differential/Platelets  . Stratify JCV Ab (w/ Index) w/ Rflx   Preslei is doing well from an MS standpoint.  We will continue Tysabri infusions monthly.  We will update labs today.  She will continue Adderall, clonazepam, Emgality as prescribed.  PDMP reviewed and reveals appropriate refills.  She will continue to monitor symptoms of right foot pain.  Healthy lifestyle habits encouraged.  She will follow-up with Dr. Epimenio Foot in 6 months, sooner if needed.  She verbalizes understanding and agreement with this plan.   I spent 30 minutes of face-to-face and non-face-to-face time with patient.  This included previsit chart review, lab review, study review, order entry, electronic health record documentation, patient education.    Shawnie Dapper, MSN, FNP-C 04/23/2020, 10:55 AM  [facility] [address] [place], [place] [address] [phone]

## 2020-04-24 ENCOUNTER — Encounter: Payer: Self-pay | Admitting: *Deleted

## 2020-04-24 LAB — CBC WITH DIFFERENTIAL/PLATELET
Basophils Absolute: 0.1 10*3/uL (ref 0.0–0.2)
Basos: 0 %
EOS (ABSOLUTE): 0.2 10*3/uL (ref 0.0–0.4)
Eos: 2 %
Hematocrit: 43.3 % (ref 34.0–46.6)
Hemoglobin: 14.2 g/dL (ref 11.1–15.9)
Immature Grans (Abs): 0.1 10*3/uL (ref 0.0–0.1)
Immature Granulocytes: 1 %
Lymphocytes Absolute: 4.3 10*3/uL — ABNORMAL HIGH (ref 0.7–3.1)
Lymphs: 36 %
MCH: 28.7 pg (ref 26.6–33.0)
MCHC: 32.8 g/dL (ref 31.5–35.7)
MCV: 88 fL (ref 79–97)
Monocytes Absolute: 0.7 10*3/uL (ref 0.1–0.9)
Monocytes: 6 %
Neutrophils Absolute: 6.7 10*3/uL (ref 1.4–7.0)
Neutrophils: 55 %
Platelets: 273 10*3/uL (ref 150–450)
RBC: 4.94 x10E6/uL (ref 3.77–5.28)
RDW: 13.9 % (ref 11.7–15.4)
WBC: 11.9 10*3/uL — ABNORMAL HIGH (ref 3.4–10.8)

## 2020-04-24 LAB — VITAMIN D 25 HYDROXY (VIT D DEFICIENCY, FRACTURES): Vit D, 25-Hydroxy: 18.8 ng/mL — ABNORMAL LOW (ref 30.0–100.0)

## 2020-05-01 LAB — STRATIFY JCV AB (W/ INDEX) W/ RFLX
Index Value: 0.2 — ABNORMAL HIGH
Stratify JCV (TM) Ab w/Reflex Inhibition: UNDETERMINED — AB

## 2020-05-01 LAB — RFLX STRATIFY JCV (TM) AB INHIBITION: JCV Antibody by Inhibition: NEGATIVE

## 2020-05-06 ENCOUNTER — Other Ambulatory Visit: Payer: Self-pay | Admitting: Neurology

## 2020-05-07 ENCOUNTER — Ambulatory Visit (HOSPITAL_COMMUNITY)
Admission: RE | Admit: 2020-05-07 | Discharge: 2020-05-07 | Disposition: A | Discharge status: Home / Self Care | Payer: Medicare Other | Source: Ambulatory Visit | Attending: Internal Medicine | Admitting: Internal Medicine

## 2020-05-07 ENCOUNTER — Other Ambulatory Visit: Payer: Self-pay

## 2020-05-07 ENCOUNTER — Telehealth: Payer: Self-pay | Admitting: *Deleted

## 2020-05-07 DIAGNOSIS — G35 Multiple sclerosis: Secondary | ICD-10-CM | POA: Insufficient documentation

## 2020-05-07 MED ORDER — SODIUM CHLORIDE 0.9 % IV SOLN
INTRAVENOUS | Status: DC | PRN
  Administered 2020-05-07: 10:00:00 250 mL via INTRAVENOUS

## 2020-05-07 MED ORDER — SODIUM CHLORIDE 0.9 % IV SOLN
300.0000 mg | INTRAVENOUS | Status: DC
  Administered 2020-05-07: 10:00:00 300 mg via INTRAVENOUS
  Filled 2020-05-07: qty 15

## 2020-05-07 NOTE — Telephone Encounter (Signed)
I spoke to the patient and she is aware of her lab results. 

## 2020-05-07 NOTE — Telephone Encounter (Signed)
-----   Message from Shawnie Dapper, NP sent at 05/07/2020  4:39 PM EST ----- JCV indeterminate at 0.20, however assay is negative. We will continue to monitor.

## 2020-05-07 NOTE — Progress Notes (Signed)
Patient received IV Tysabri as ordered by Despina Arias, MD. Patient declined the 60 minutes post infusion observation.Tolerated well, vitals stable, discharge instructions given, verbalized understanding. Patient alert, oriented and ambulatory at the time of discharge.

## 2020-06-04 ENCOUNTER — Other Ambulatory Visit: Payer: Self-pay

## 2020-06-04 ENCOUNTER — Ambulatory Visit (HOSPITAL_COMMUNITY)
Admission: RE | Admit: 2020-06-04 | Discharge: 2020-06-04 | Disposition: A | Discharge status: Home / Self Care | Payer: Medicare Other | Source: Ambulatory Visit | Attending: Internal Medicine | Admitting: Internal Medicine

## 2020-06-04 DIAGNOSIS — G35 Multiple sclerosis: Secondary | ICD-10-CM | POA: Diagnosis not present

## 2020-06-04 MED ORDER — SODIUM CHLORIDE 0.9 % IV SOLN
300.0000 mg | INTRAVENOUS | Status: DC
  Administered 2020-06-04: 300 mg via INTRAVENOUS
  Filled 2020-06-04: qty 15

## 2020-06-04 MED ORDER — SODIUM CHLORIDE 0.9 % IV SOLN
INTRAVENOUS | Status: DC | PRN
  Administered 2020-06-04: 250 mL via INTRAVENOUS

## 2020-06-04 NOTE — Progress Notes (Signed)
PATIENT CARE CENTER NOTE  Diagnosis:Multiple Sclerosis (G35)   Provider:Sater, Richard, MD   Procedure:Tysabri infusion   Note:Patient received Tysabri infusion via PIV. Tolerated well with no adverse reaction. Vital signs stable. Patientdeclinedto wait for 1 hour post-infusion. Discharge instructions offered but patient refused. Patient alert, oriented and ambulatory at discharge.

## 2020-06-05 ENCOUNTER — Other Ambulatory Visit: Payer: Self-pay | Admitting: Neurology

## 2020-06-10 ENCOUNTER — Other Ambulatory Visit: Payer: Self-pay | Admitting: Family Medicine

## 2020-06-10 MED ORDER — AMPHETAMINE-DEXTROAMPHETAMINE 20 MG PO TABS: 20.0000 mg | ORAL_TABLET | Freq: Two times a day (BID) | ORAL | 0 refills | Status: DC

## 2020-06-10 NOTE — Telephone Encounter (Signed)
Pt called stating that the [facility] informed her that they have requested for a refill on her amphetamine-dextroamphetamine (ADDERALL) 20 MG tablet but they have not heard back. Pt would like to know when her medication will be called in. Please advise.

## 2020-06-24 ENCOUNTER — Telehealth: Payer: Self-pay | Admitting: Neurology

## 2020-06-24 NOTE — Telephone Encounter (Signed)
Spoke to pt and she has moved to [facility] and would like to continue neuro care with [place] but would like to get infusions closer to home.  [facility] in [facility] if possibility.  She [place] due next Tysabri infusion 07-02-2020 (at [facility]).  I spoke o [place] with [employer] program[facility] and gave her the information, new address.  She will reach out to pt via support and see if they would accept pt with out of state provider.  Will get back with [place].

## 2020-06-24 NOTE — Telephone Encounter (Signed)
Pt called today to get her infusion site switched since she has moved. Best call back is [phone]

## 2020-06-24 NOTE — Telephone Encounter (Signed)
I called pt and let her know that I did speak to [employer] compliance program and let them know.  She will contact them.  This may require PA, but since we donot know they will accept her we will wait until we know that for sure.   She appreciated call back.

## 2020-06-24 NOTE — Telephone Encounter (Signed)
Thanks, that would be fine

## 2020-07-02 ENCOUNTER — Telehealth: Payer: Self-pay | Admitting: *Deleted

## 2020-07-02 ENCOUNTER — Encounter (HOSPITAL_COMMUNITY): Payer: Medicare Other

## 2020-07-02 NOTE — Telephone Encounter (Signed)
Received this notice from [place] at [employer]: "We are having trouble finding an in network site that will accept an outside referral.  I've checked three different sites. I spoke to the patient and she's hesitant to travel to [place], [place] but will if she has to. I will check sites in [place] too.   I will let you know as soon as we can locate a site."

## 2020-07-02 NOTE — Telephone Encounter (Signed)
Hey Andrey Campanile you will have to Work with [employer] on this. Thanks

## 2020-07-02 NOTE — Telephone Encounter (Signed)
Received fax from touch prescribing program that pt is re-authorized for Tysabri from 07/01/20-01/28/21. PT enrollment number: [id number]. Site Auth number: [id number]. Pt Care Center.

## 2020-07-08 ENCOUNTER — Other Ambulatory Visit: Payer: Self-pay | Admitting: Family Medicine

## 2020-07-08 MED ORDER — AMPHETAMINE-DEXTROAMPHETAMINE 20 MG PO TABS: 20.0000 mg | ORAL_TABLET | Freq: Two times a day (BID) | ORAL | 0 refills | Status: DC

## 2020-07-08 MED ORDER — CLONAZEPAM 1 MG PO TABS: ORAL_TABLET | ORAL | 5 refills | Status: DC

## 2020-07-08 NOTE — Telephone Encounter (Signed)
I called patient.  She needs refills of her Adderall and clonazepam sent to [facility] in [place], [place].  I have checked a [place] Controlled Substance Registry registry.  Patient is due for refill on Adderall and clonazepam.  The registry is appropriate.

## 2020-07-08 NOTE — Addendum Note (Signed)
Addended by: Geronimo Running A on: 07/08/2020 01:24 PM   Modules accepted: Orders

## 2020-07-08 NOTE — Telephone Encounter (Signed)
Received this notice from Crystal at [employer]: "We have located a site for A.F. ??  [facility] Phone [phone] Fax [phone]  The patient is in agreement, and they are in network."

## 2020-07-08 NOTE — Telephone Encounter (Signed)
Pt. is requesting a call from RN. 

## 2020-07-15 ENCOUNTER — Telehealth: Payer: Self-pay | Admitting: Family Medicine

## 2020-07-15 NOTE — Telephone Encounter (Signed)
I called [facility].  No answer, left a voicemail with their infusion suite to call me back to confirm that they have received this order and made nothing further from [place].  I called patient.  She reports that she spoke to [facility] this morning and they needed an order but had received everything they needed from [employer].  I advised her that I have sent the order for Tysabri to them and have called them to make sure we need nothing further.  Patient verbalized understanding

## 2020-07-15 NOTE — Telephone Encounter (Signed)
Pt called wanting to know when the orders for her Tysabri will be sent to the [facility] in [place] in [place], [place] Please advise.

## 2020-07-15 NOTE — Telephone Encounter (Signed)
Order for Tysabri signed by Dr. Epimenio Foot. Faxed to [facility].  Received a receipt of confirmation.

## 2020-07-16 NOTE — Telephone Encounter (Signed)
I called [facility].  I spoke to [place] in the infusion suite.  They did receive the Tysabri order.  They will reach out to the patient today to schedule this.  They will reach out to [place] if anything further is needed.  Their phone number is [phone].  Their fax number is [phone].

## 2020-07-22 NOTE — Telephone Encounter (Signed)
I called patient.  She is scheduled for her Tysabri infusion on March 18 at [facility].  She will let us know if there is any other issues or concerns.

## 2020-08-05 ENCOUNTER — Other Ambulatory Visit: Payer: Self-pay

## 2020-08-05 ENCOUNTER — Telehealth: Payer: Self-pay | Admitting: Family Medicine

## 2020-08-05 MED ORDER — AMPHETAMINE-DEXTROAMPHETAMINE 20 MG PO TABS: 20.0000 mg | ORAL_TABLET | Freq: Two times a day (BID) | ORAL | 0 refills | Status: DC

## 2020-08-05 NOTE — Telephone Encounter (Signed)
Pt has called for a refill on her amphetamine-dextroamphetamine (ADDERALL) 20 MG tablet to  [facility] [phone]

## 2020-08-05 NOTE — Addendum Note (Signed)
Addended by: Shawnie Dapper L on: 08/05/2020 04:52 PM   Modules accepted: Orders

## 2020-08-05 NOTE — Telephone Encounter (Signed)
Pt is requesting Adderall, last seen by Lomax 05/03/2020, has upcoming appt. With Sater 10/23/2020. Rx last filled 06/10/2020. [place] drug registry has been checked and refill is appropriate on my end, please advise

## 2020-08-07 ENCOUNTER — Other Ambulatory Visit: Payer: Self-pay | Admitting: Family Medicine

## 2020-08-07 MED ORDER — AMPHETAMINE-DEXTROAMPHETAMINE 20 MG PO TABS: 20.0000 mg | ORAL_TABLET | Freq: Two times a day (BID) | ORAL | 0 refills | Status: DC

## 2020-08-07 NOTE — Telephone Encounter (Signed)
Medication was ordered 2 days ago by NP Lomax. Adderall generic , 20 mg tab. Bid.

## 2020-09-05 ENCOUNTER — Other Ambulatory Visit: Payer: Self-pay | Admitting: Neurology

## 2020-09-05 MED ORDER — AMPHETAMINE-DEXTROAMPHETAMINE 20 MG PO TABS: 20.0000 mg | ORAL_TABLET | Freq: Two times a day (BID) | ORAL | 0 refills | Status: DC

## 2020-10-08 ENCOUNTER — Other Ambulatory Visit: Payer: Self-pay | Admitting: Family Medicine

## 2020-10-08 MED ORDER — AMPHETAMINE-DEXTROAMPHETAMINE 20 MG PO TABS: 20.0000 mg | ORAL_TABLET | Freq: Two times a day (BID) | ORAL | 0 refills | Status: DC

## 2020-10-23 ENCOUNTER — Ambulatory Visit: Payer: Medicare Other | Admitting: Neurology

## 2020-11-06 ENCOUNTER — Encounter: Payer: Self-pay | Admitting: Neurology

## 2020-11-06 ENCOUNTER — Other Ambulatory Visit: Payer: Self-pay

## 2020-11-06 ENCOUNTER — Telehealth: Payer: Self-pay | Admitting: *Deleted

## 2020-11-06 ENCOUNTER — Ambulatory Visit: Payer: Medicare Other | Admitting: Neurology

## 2020-11-06 VITALS — BP 124/80 | HR 58 | Ht 68.0 in | Wt 268.5 lb

## 2020-11-06 DIAGNOSIS — Z79899 Other long term (current) drug therapy: Secondary | ICD-10-CM | POA: Diagnosis not present

## 2020-11-06 DIAGNOSIS — R269 Unspecified abnormalities of gait and mobility: Secondary | ICD-10-CM | POA: Diagnosis not present

## 2020-11-06 DIAGNOSIS — G2581 Restless legs syndrome: Secondary | ICD-10-CM

## 2020-11-06 DIAGNOSIS — R5383 Other fatigue: Secondary | ICD-10-CM | POA: Diagnosis not present

## 2020-11-06 DIAGNOSIS — G35 Multiple sclerosis: Secondary | ICD-10-CM | POA: Diagnosis not present

## 2020-11-06 DIAGNOSIS — E559 Vitamin D deficiency, unspecified: Secondary | ICD-10-CM

## 2020-11-06 DIAGNOSIS — G4700 Insomnia, unspecified: Secondary | ICD-10-CM

## 2020-11-06 MED ORDER — EMGALITY 120 MG/ML ~~LOC~~ SOAJ
SUBCUTANEOUS | 4 refills | Status: DC
Start: 2020-11-06 — End: 2022-01-20

## 2020-11-06 MED ORDER — CLONAZEPAM 1 MG PO TABS
ORAL_TABLET | ORAL | 5 refills | Status: DC
Start: 2020-11-06 — End: 2021-05-19

## 2020-11-06 MED ORDER — AMPHETAMINE-DEXTROAMPHETAMINE 20 MG PO TABS
20.0000 mg | ORAL_TABLET | Freq: Two times a day (BID) | ORAL | 0 refills | Status: DC
Start: 2020-11-06 — End: 2020-12-06

## 2020-11-06 NOTE — Progress Notes (Signed)
[facility]  PATIENT: Caitlin [name] DOB: 1986/05/02  REFERRING CLINICIAN: Fredderick Severance  HISTORY FROM: patient REASON FOR VISIT: MS   HISTORICAL  CHIEF COMPLAINT:  Chief Complaint  Patient presents with   Follow-up    RM 13, alone. Last seen 04/23/2020 by AL,NP. On Tysabri for MS. Last infusion 10/21/20, next : 11/18/20. Last JCV 04/23/20 indeterminate, index: 0.20, inhibition assay: negative. NEEDS LABS TODAY. Has noticed she cannot hold bladder (sneeze/cough-incontinence).     HISTORY OF PRESENT ILLNESS:  Caitlin [name] is a 35 y.o. 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      Update 11/06/2020: She feels her MS has been stable.   She is on Tysabri and tolerates it well.   Her last JCV antibody was checked 04/24/2019 and was negative.  She has noted difficulties with her bladder.    She used to have a lot of hesitancy but now is having some leakage, especially with cough or sneeze.     She denies any change in sensation, strength or gait.  She gets occasional left leg dysesthesias..   No falls.   Vision is stable now though she had some OD blurriness earlier this year.     Colors are duller OD.     She has fatigue, better with Adderall.  Sleep is much better and RLS has improved since starting clonazepam at bedtime.    She has some days when she notes some word finding issues, esp if tired, but overall cognition is doing well.     Migraines are better on Emgality.  She has only had one severe migraine and a few moderate ones over the last 6 months.  She takes NSAIDs, often with benefit if one occurs.    Triptans had not helped.   Laying in a dark room helped also.    She does best if she falls asleep as they are better upon awakening.     MS History:   She was diagnosed with MS in 2016 after presenting with optic neuritis.  A couple years earlier, she had had possible transverse myelitis with leg weakness.  She was started on Tysabri as a DMT March 2016.     Despite a lot of symptoms, she has a very low plaque burden on the brain MRI and no lesions have been seen in her spine.   MS was diagnosed after an episode of optic neuritis and having a couple small non-specific foci on brain MRI.    A couple years earlier she had a possible transverse myelitis (vs mild GBS).           IMAGING: MRI of the cervical spine 03/02/2016 was essentially normal. There was no evidence of cervical myelopathy and just disc bulge at 2 levels.  The MRI of the brain 06/01/2016 is unchanged compared to the MRI of the brain 01/31/2016 and showed no acute findings.      MRI of the brain 01/07/2019 showed several small T2/flair hyperintense foci in the left hemisphere and right cerebellum.  Though nonspecific, these could be consistent with chronic demyelinating plaque associated with multiple sclerosis.  None of these appear to be acute and they were all present on previous MRIs.     No acute findings.   REVIEW OF SYSTEMS:  Constitutional: No fevers, chills, sweats, or change in appetite.   She has fatigue and insomnia Eyes: as above Ear, nose and throat: No hearing loss, ear pain, nasal congestion, sore throat Cardiovascular: No chest pain, palpitations  Respiratory:  No shortness of breath at rest or with exertion.   No wheezes GastrointestinaI: No nausea, vomiting, diarrhea, abdominal pain, fecal incontinence Genitourinary:  She reports hesitancy.     She notes nocturia. Musculoskeletal:  No neck pain, some back pain Integumentary: No rash, pruritus, skin lesions Neurological: as above Psychiatric: Some depression at this time.  Some anxiety Endocrine: No palpitations, diaphoresis, change in appetite, change in weigh or increased thirst  ALLERGIES: Allergies  Allergen Reactions   Ciprofloxacin Anaphylaxis   Influenza Vaccines Other (See Comments)    Hx of Guillain Barre    HOME MEDICATIONS: Outpatient Medications Prior to Visit  Medication Sig Dispense Refill    amphetamine-dextroamphetamine (ADDERALL) 20 MG tablet Take 1 tablet (20 mg total) by mouth 2 (two) times daily. 60 tablet 0   cholecalciferol (VITAMIN D3) 25 MCG (1000 UNIT) tablet Take 1,000 Units by mouth daily.     clonazePAM (KLONOPIN) 1 MG tablet TAKE 1/2 TO 1 TABLET BY MOUTH AT BEDTIME 30 tablet 5   EMGALITY 120 MG/ML SOAJ INJECT 120 MG SUBCUTANEOUSLY ONCE EVERY 30 DAYS 3 mL 4   natalizumab (TYSABRI) 300 MG/15ML injection Inject into the vein.     No facility-administered medications prior to visit.    PAST MEDICAL HISTORY: Past Medical History:  Diagnosis Date   Back pain    Guillain Barr syndrome (HCC)    Headache    MS (multiple sclerosis) (HCC)    Pseudoseizures (HCC)    Vision abnormalities     PAST SURGICAL HISTORY: Past Surgical History:  Procedure Laterality Date   BACK SURGERY     CESAREAN SECTION  2012   CHOLECYSTECTOMY, LAPAROSCOPIC  2004   KNEE SURGERY     LUMBAR LAMINECTOMY     VAGINAL HYSTERECTOMY      FAMILY HISTORY: Family History  Problem Relation Age of Onset   Diabetes type II Mother    Hypertension Mother    Fibromyalgia Mother    Cardiomyopathy Father    Heart attack Father     SOCIAL HISTORY:  Social History   Socioeconomic History   Marital status: Married    Spouse name: Not on file   Number of children: Not on file   Years of education: Not on file   Highest education level: Not on file  Occupational History   Not on file  Tobacco Use   Smoking status: Former    Packs/day: 1.00    Pack years: 0.00    Types: Cigarettes   Smokeless tobacco: Never  Vaping Use   Vaping Use: Every day   Substances: Nicotine, Flavoring  Substance and Sexual Activity   Alcohol use: No    Alcohol/week: 0.0 standard drinks   Drug use: No   Sexual activity: Not on file  Other Topics Concern   Not on file  Social History Narrative   Not on file   Social Determinants of Health   Financial Resource Strain: Not on file  Food Insecurity: Not  on file  Transportation Needs: Not on file  Physical Activity: Not on file  Stress: Not on file  Social Connections: Not on file  Intimate Partner Violence: Not on file     PHYSICAL EXAM  Vitals:   11/06/20 1114  BP: 124/80  Pulse: (!) 58  Weight: 268 lb 8 oz (121.8 kg)  Height: 5\' 8"  (1.727 m)    Body mass index is 40.83 kg/m.   General: The patient is well-developed and well-nourished and in no acute  distress.  Neck range of motion is normal.  Neurologic Exam  Mental status: The patient is alert and oriented x 3 at the time of the examination.  Normal memory and attention.     Cranial nerves: EOM intact.  There is reduced color vision out of the right eye.     Speech is normal.    Normal facial strength and slightly decreased left sensation.   SCM/trapezius are strong.   Hearing is symmetric         Motor:  Muscle bulk and tone are normal. Strength is  5 / 5 in the arms.  Strength was 5/5 except for 4+/5 left toe, foot and ankle extension.  Coordination:   Finger-nose-finger is normal bilaterally.  Left Heel to shin is mildly reduced.  .   Sensory: On sensory testing she reports decreased sensation to vibration and temperature in the left leg.  Sensation is symmetric in arms       Gait and station: Station is normal. The gait is arthritic and mildly wide.  Tandem gait is wide.  Romberg is negative.  Reflexes: DTRs were mildly asymmetric, increased on her left..  .      ASSESSMENT AND PLAN 1. Multiple sclerosis (HCC)   2. Other fatigue   3. High risk medication use   4. Gait disturbance   5. Insomnia, unspecified type   6. Vitamin D deficiency   7. Restless leg syndrome      1.   Continue Tysabri.  Check CBC and JCV Ab today 2.   Continue  Emgality for chronic migraines  3.   Continue Adderall for fatigue, and clonazepam for sleep/anxiety 4.   Stay active and exercise as tolerated Return in 6 months or sooner if there are new or worsening neurologic  symptoms.     Norfleet Capers A. Epimenio Foot, MD, PhD 11/06/2020, 11:39 AM Certified in Neurology, Clinical Neurophysiology, Sleep Medicine, Pain Medicine and Neuroimaging  [facility] [address], [address] [place], [place] [address] [phone]

## 2020-11-06 NOTE — Telephone Encounter (Signed)
Placed [facility] lab in [facility] lock box for routine lab pick up. Results pending. 

## 2020-11-07 ENCOUNTER — Other Ambulatory Visit: Payer: Self-pay | Admitting: *Deleted

## 2020-11-07 DIAGNOSIS — E559 Vitamin D deficiency, unspecified: Secondary | ICD-10-CM

## 2020-11-07 LAB — CBC WITH DIFFERENTIAL/PLATELET
Basophils Absolute: 0.1 10*3/uL (ref 0.0–0.2)
Basos: 1 %
EOS (ABSOLUTE): 0.2 10*3/uL (ref 0.0–0.4)
Eos: 2 %
Hematocrit: 42.8 % (ref 34.0–46.6)
Hemoglobin: 13.7 g/dL (ref 11.1–15.9)
Immature Grans (Abs): 0.1 10*3/uL (ref 0.0–0.1)
Immature Granulocytes: 1 %
Lymphocytes Absolute: 4.2 10*3/uL — ABNORMAL HIGH (ref 0.7–3.1)
Lymphs: 43 %
MCH: 28.5 pg (ref 26.6–33.0)
MCHC: 32 g/dL (ref 31.5–35.7)
MCV: 89 fL (ref 79–97)
Monocytes Absolute: 0.6 10*3/uL (ref 0.1–0.9)
Monocytes: 6 %
NRBC: 1 % — ABNORMAL HIGH (ref 0–0)
Neutrophils Absolute: 4.7 10*3/uL (ref 1.4–7.0)
Neutrophils: 47 %
Platelets: 238 10*3/uL (ref 150–450)
RBC: 4.8 x10E6/uL (ref 3.77–5.28)
RDW: 14.6 % (ref 11.7–15.4)
WBC: 9.8 10*3/uL (ref 3.4–10.8)

## 2020-11-07 LAB — VITAMIN D 25 HYDROXY (VIT D DEFICIENCY, FRACTURES): Vit D, 25-Hydroxy: 18.5 ng/mL — ABNORMAL LOW (ref 30.0–100.0)

## 2020-11-07 MED ORDER — VITAMIN D (ERGOCALCIFEROL) 1.25 MG (50000 UNIT) PO CAPS: 50000.0000 [IU] | ORAL_CAPSULE | ORAL | 3 refills | Status: DC

## 2020-11-12 NOTE — Telephone Encounter (Signed)
JCV ab drawn on 11/06/20 negative, index: 0.16.

## 2020-12-06 ENCOUNTER — Other Ambulatory Visit: Payer: Self-pay

## 2020-12-09 MED ORDER — AMPHETAMINE-DEXTROAMPHETAMINE 20 MG PO TABS: 20.0000 mg | ORAL_TABLET | Freq: Two times a day (BID) | ORAL | 0 refills | Status: DC

## 2020-12-09 NOTE — Telephone Encounter (Signed)
Received refill request for Adderall. Last OV was on 11/06/20. Next OV is scheduled for 05/19/21.   Last RX was written on 06/10/20 for 60 tabs.   [place] [facility] Database has been reviewed.

## 2021-01-07 ENCOUNTER — Other Ambulatory Visit: Payer: Self-pay

## 2021-01-07 MED ORDER — AMPHETAMINE-DEXTROAMPHETAMINE 20 MG PO TABS: 20.0000 mg | ORAL_TABLET | Freq: Two times a day (BID) | ORAL | 0 refills | Status: DC

## 2021-01-09 ENCOUNTER — Other Ambulatory Visit: Payer: Self-pay | Admitting: *Deleted

## 2021-01-09 MED ORDER — AMPHETAMINE-DEXTROAMPHETAMINE 20 MG PO TABS: 20.0000 mg | ORAL_TABLET | Freq: Two times a day (BID) | ORAL | 0 refills | Status: DC

## 2021-02-06 ENCOUNTER — Other Ambulatory Visit: Payer: Self-pay

## 2021-02-06 MED ORDER — AMPHETAMINE-DEXTROAMPHETAMINE 20 MG PO TABS: 20.0000 mg | ORAL_TABLET | Freq: Two times a day (BID) | ORAL | 0 refills | Status: DC

## 2021-03-10 ENCOUNTER — Other Ambulatory Visit: Payer: Self-pay

## 2021-03-10 MED ORDER — AMPHETAMINE-DEXTROAMPHETAMINE 20 MG PO TABS: 20.0000 mg | ORAL_TABLET | Freq: Two times a day (BID) | ORAL | 0 refills | Status: DC

## 2021-04-09 ENCOUNTER — Other Ambulatory Visit: Payer: Self-pay | Admitting: Neurology

## 2021-04-09 NOTE — Telephone Encounter (Signed)
Received refill request for Adderall.  Last OV was on 11/06/20.  Next OV is scheduled for 05/29/21 .  Last RX was written on 03/13/21 for 60 tabs.   Pt is from [facility], called and confirmed fill date w pharmacy Ok to fill.   [place] [facility] Database has been reviewed.

## 2021-04-10 MED ORDER — AMPHETAMINE-DEXTROAMPHETAMINE 20 MG PO TABS: 20.0000 mg | ORAL_TABLET | Freq: Two times a day (BID) | ORAL | 0 refills | Status: DC

## 2021-05-11 ENCOUNTER — Other Ambulatory Visit: Payer: Self-pay | Admitting: Neurology

## 2021-05-14 ENCOUNTER — Other Ambulatory Visit: Payer: Self-pay | Admitting: Neurology

## 2021-05-14 MED ORDER — AMPHETAMINE-DEXTROAMPHETAMINE 20 MG PO TABS: 20.0000 mg | ORAL_TABLET | Freq: Two times a day (BID) | ORAL | 0 refills | Status: DC

## 2021-05-14 NOTE — Telephone Encounter (Signed)
REFUSE. DUPLICATE ORDER  Already sent one before this waiting for approval.

## 2021-05-14 NOTE — Telephone Encounter (Signed)
Received refill request for Adderall.  Last OV was on 11/06/20.  Next OV is scheduled for 05/19/21 .  Last RX was written on 04/12/20 for 60 tabs.   Called pharmacy to confirm fill date, ok to fill.   [place] Drug Database has been reviewed.

## 2021-05-19 ENCOUNTER — Encounter: Payer: Self-pay | Admitting: Neurology

## 2021-05-19 ENCOUNTER — Ambulatory Visit (INDEPENDENT_AMBULATORY_CARE_PROVIDER_SITE_OTHER): Payer: No Typology Code available for payment source | Admitting: Neurology

## 2021-05-19 VITALS — BP 117/76 | HR 75 | Ht 68.0 in | Wt 269.0 lb

## 2021-05-19 DIAGNOSIS — Z79899 Other long term (current) drug therapy: Secondary | ICD-10-CM | POA: Diagnosis not present

## 2021-05-19 DIAGNOSIS — I480 Paroxysmal atrial fibrillation: Secondary | ICD-10-CM

## 2021-05-19 DIAGNOSIS — R5382 Chronic fatigue, unspecified: Secondary | ICD-10-CM

## 2021-05-19 DIAGNOSIS — G35 Multiple sclerosis: Secondary | ICD-10-CM

## 2021-05-19 DIAGNOSIS — G2581 Restless legs syndrome: Secondary | ICD-10-CM

## 2021-05-19 MED ORDER — CLONAZEPAM 1 MG PO TABS: ORAL_TABLET | ORAL | 5 refills | Status: DC

## 2021-05-19 MED ORDER — ROPINIROLE HCL 0.5 MG PO TABS: ORAL_TABLET | ORAL | 5 refills | Status: DC

## 2021-05-19 NOTE — Progress Notes (Signed)
[facility]  PATIENT: Caitlin [name] DOB: 1986-03-31  REFERRING CLINICIAN: Kennon Holter  HISTORY FROM: patient REASON FOR VISIT: MS   HISTORICAL  CHIEF COMPLAINT:  Chief Complaint  Patient presents with   Follow-up    Rm 2,pt alone, MS follow. Established on Tysabri. Last infusion 05/09/2021. Infusion is at [facility] Schedule for her next one. Overall stable and states no issues. She has started having heart rate concerns and is scheduled for ablation in feb    HISTORY OF PRESENT ILLNESS:  Caitlin [name] is a 36 y.o. 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      Update 1/9/203: She had AFIb events x 2 with spontaneous recovery.   She has been evaluated by cardiology at [facility] (Dr. Michaell Cowing [place]).  She feels her MS has been stable.   She is on Tysabri and tolerates it well.   Her last JCV antibody was checked 04/24/2019 and was negative.  She denies any change in sensation, strength or gait.  She gets occasional left leg dysesthesias..   No falls.   She notes no new visual symptoms.  She continues to have some sequela of the optic neuritis on the right with desaturated colors.    She has noted difficulties with her bladder.    She will occasionally have leakage, especially if she coughs or sneezes  She continues to experience fatigue.  Adderall has helped.   She has RLS helped by clonazepam.     She has some days when she notes some word finding issues, esp if tired, but overall cognition is doing well.     Migraines are better on Emgality.  She has only had one severe migraine and a few moderate ones over the last 6 months.  She takes NSAIDs, often with benefit if one occurs.    Triptans had not helped.   Laying in a dark room helped also.    She does best if she falls asleep as they are better upon awakening.     MS History:   She was diagnosed with MS in 2016 after presenting with optic neuritis.  A couple years  earlier, she had had possible transverse myelitis with leg weakness.  She was started on Tysabri as a DMT March 2016.    Despite a lot of symptoms, she has a very low plaque burden on the brain MRI and no lesions have been seen in her spine.   MS was diagnosed after an episode of optic neuritis and having a couple small non-specific foci on brain MRI.    A couple years earlier she had a possible transverse myelitis (vs mild GBS).        MRI brain from 2012 showed one small enhancing lesion at that time (read as nornal but reviewed at 2016 visit).  MRI in 2016 showed 4 foci, 3 not present in 2012.      IMAGING: MRI of the cervical spine 03/02/2016 was essentially normal. There was no evidence of cervical myelopathy and just disc bulge at 2 levels.  The MRI of the brain 06/01/2016 is unchanged compared to the MRI of the brain 01/31/2016 and showed no acute findings.      MRI of the brain 01/07/2019 showed several small T2/flair hyperintense foci in the left hemisphere and right cerebellum.  Though nonspecific, these could be consistent with chronic demyelinating plaque associated with multiple sclerosis.  None of these appear to be acute and they were all  present on previous MRIs.     No acute findings.   REVIEW OF SYSTEMS:  Constitutional: No fevers, chills, sweats, or change in appetite.   She has fatigue and insomnia Eyes: as above Ear, nose and throat: No hearing loss, ear pain, nasal congestion, sore throat Cardiovascular: No chest pain, palpitations Respiratory:  No shortness of breath at rest or with exertion.   No wheezes GastrointestinaI: No nausea, vomiting, diarrhea, abdominal pain, fecal incontinence Genitourinary:  She reports hesitancy.     She notes nocturia. Musculoskeletal:  No neck pain, some back pain Integumentary: No rash, pruritus, skin lesions Neurological: as above Psychiatric: Some depression at this time.  Some anxiety Endocrine: No palpitations, diaphoresis, change in  appetite, change in weigh or increased thirst  ALLERGIES: Allergies  Allergen Reactions   Ciprofloxacin Anaphylaxis   Influenza Vaccines Other (See Comments)    Hx of Guillain Barre    HOME MEDICATIONS: Outpatient Medications Prior to Visit  Medication Sig Dispense Refill   amphetamine-dextroamphetamine (ADDERALL) 20 MG tablet Take 1 tablet (20 mg total) by mouth 2 (two) times daily. 60 tablet 0   cholecalciferol (VITAMIN D3) 25 MCG (1000 UNIT) tablet Take 1,000 Units by mouth daily.     flecainide (TAMBOCOR) 100 MG tablet Take 100 mg by mouth daily as needed.     Galcanezumab-gnlm (EMGALITY) 120 MG/ML SOAJ INJECT 120 MG SUBCUTANEOUSLY ONCE EVERY 30 DAYS 3 mL 4   natalizumab (TYSABRI) 300 MG/15ML injection Inject into the vein.     clonazePAM (KLONOPIN) 1 MG tablet TAKE 1/2 TO 1 TABLET BY MOUTH AT BEDTIME 30 tablet 5   Vitamin D, Ergocalciferol, (DRISDOL) 1.25 MG (50000 UNIT) CAPS capsule Take 1 capsule (50,000 Units total) by mouth every 7 (seven) days. 13 capsule 3   No facility-administered medications prior to visit.    PAST MEDICAL HISTORY: Past Medical History:  Diagnosis Date   Back pain    Guillain Barr syndrome ([place])    Headache    MS (multiple sclerosis) (HCC)    Pseudoseizures ([place])    Vision abnormalities     PAST SURGICAL HISTORY: Past Surgical History:  Procedure Laterality Date   BACK SURGERY     CESAREAN SECTION  2012   CHOLECYSTECTOMY, LAPAROSCOPIC  2004   KNEE SURGERY     LUMBAR LAMINECTOMY     VAGINAL HYSTERECTOMY      FAMILY HISTORY: Family History  Problem Relation Age of Onset   Diabetes type II Mother    Hypertension Mother    Fibromyalgia Mother    Cardiomyopathy Father    Heart attack Father     SOCIAL HISTORY:  Social History   Socioeconomic History   Marital status: Married    Spouse name: Not on file   Number of children: Not on file   Years of education: Not on file   Highest education level: Not on file  Occupational  History   Not on file  Tobacco Use   Smoking status: Former    Packs/day: 1.00    Types: Cigarettes   Smokeless tobacco: Never  Vaping Use   Vaping Use: Every day   Substances: Nicotine, Flavoring  Substance and Sexual Activity   Alcohol use: No    Alcohol/week: 0.0 standard drinks   Drug use: No   Sexual activity: Not on file  Other Topics Concern   Not on file  Social History Narrative   Not on file   Social Determinants of Health   Financial Resource Strain: Not  on file  Food Insecurity: Not on file  Transportation Needs: Not on file  Physical Activity: Not on file  Stress: Not on file  Social Connections: Not on file  Intimate Partner Violence: Not on file     PHYSICAL EXAM  Vitals:   05/19/21 1029  BP: 117/76  Pulse: 75  Weight: 269 lb (122 kg)  Height: 5\' 8"  (1.727 m)    Body mass index is 40.9 kg/m.   General: The patient is well-developed and well-nourished and in no acute distress.  Neck range of motion is normal.  Neurologic Exam  Mental status: The patient is alert and oriented x 3 at the time of the examination.  Normal memory and attention.     Cranial nerves: EOM intact.  There is reduced color vision out of the right eye.     Speech is normal.    Normal facial strength and slightly decreased left sensation.   SCM/trapezius are strong.   Hearing is symmetric         Motor:  Muscle bulk and tone are normal. Strength is  5 / 5 in the arms.  Strength was 5/5 except for 4+/5 left toe, foot and ankle extension.  Coordination:   Finger-nose-finger is normal bilaterally.  Left Heel to shin is mildly reduced.  .   Sensory: On sensory testing she reports decreased sensation to vibration and temperature in the left leg.  Sensation is symmetric in arms       Gait and station: Station is normal.  The gait is fairly normal.  Tandem gait is wide.  Romberg is negative.  Reflexes: DTRs were mildly asymmetric, increased on her left..  .      ASSESSMENT  AND PLAN 1. Multiple sclerosis ([place])   2. Chronic fatigue   3. High risk medication use   4. Restless leg syndrome   5. Paroxysmal atrial fibrillation (HCC)       1.   Continue Tysabri.  Check CBC and JCV Ab today. 2.   For migraines, she will continue  Emgality for chronic migraines  3.   Continue Adderall for fatigue, and clonazepam for sleep/anxiety 4.   Stay active and exercise as tolerated Return in 6 months or sooner if there are new or worsening neurologic symptoms.     Bertran Zeimet A. Felecia Shelling, MD, PhD [id number], [id number] PM Certified in Neurology, Clinical Neurophysiology, Sleep Medicine, Pain Medicine and Neuroimaging  [facility] [address], [place], [place] [address] [phone]

## 2021-05-28 LAB — CBC WITH DIFFERENTIAL/PLATELET
Basophils Absolute: 0 10*3/uL (ref 0.0–0.2)
Basos: 0 %
EOS (ABSOLUTE): 0.1 10*3/uL (ref 0.0–0.4)
Eos: 1 %
Hematocrit: 40.3 % (ref 34.0–46.6)
Hemoglobin: 13.3 g/dL (ref 11.1–15.9)
Immature Grans (Abs): 0.1 10*3/uL (ref 0.0–0.1)
Immature Granulocytes: 1 %
Lymphocytes Absolute: 4.1 10*3/uL — ABNORMAL HIGH (ref 0.7–3.1)
Lymphs: 45 %
MCH: 29.2 pg (ref 26.6–33.0)
MCHC: 33 g/dL (ref 31.5–35.7)
MCV: 89 fL (ref 79–97)
Monocytes Absolute: 0.6 10*3/uL (ref 0.1–0.9)
Monocytes: 6 %
Neutrophils Absolute: 4.3 10*3/uL (ref 1.4–7.0)
Neutrophils: 47 %
Platelets: 252 10*3/uL (ref 150–450)
RBC: 4.55 x10E6/uL (ref 3.77–5.28)
RDW: 13.8 % (ref 11.7–15.4)
WBC: 9.2 10*3/uL (ref 3.4–10.8)

## 2021-05-28 LAB — HEPATIC FUNCTION PANEL
ALT: 14 IU/L (ref 0–32)
AST: 18 IU/L (ref 0–40)
Albumin: 4.1 g/dL (ref 3.8–4.8)
Alkaline Phosphatase: 63 IU/L (ref 44–121)
Bilirubin Total: 0.4 mg/dL (ref 0.0–1.2)
Bilirubin, Direct: <0.1 mg/dL (ref 0.00–0.40)
Total Protein: 6.4 g/dL (ref 6.0–8.5)

## 2021-05-28 LAB — STRATIFY JCV(TM) AB W/INDEX
JCV Antibody: NEGATIVE
JCV Index Value: 0.13

## 2021-06-12 ENCOUNTER — Other Ambulatory Visit: Payer: Self-pay | Admitting: Neurology

## 2021-06-12 MED ORDER — AMPHETAMINE-DEXTROAMPHETAMINE 20 MG PO TABS: 20.0000 mg | ORAL_TABLET | Freq: Two times a day (BID) | ORAL | 0 refills | Status: DC

## 2021-06-12 NOTE — Telephone Encounter (Signed)
Received refill request for Adderall.  Last OV was on 05/19/21.  Next OV is scheduled for 11/17/21 .  Last RX was written on 05/14/21 and picked on 05/16/21 for 60 tabs.   Pt lives in [facility], called pharmacy to verify last fill date. Ok to fill.

## 2021-07-09 ENCOUNTER — Other Ambulatory Visit: Payer: Self-pay | Admitting: Neurology

## 2021-07-10 MED ORDER — AMPHETAMINE-DEXTROAMPHETAMINE 20 MG PO TABS: 20.0000 mg | ORAL_TABLET | Freq: Two times a day (BID) | ORAL | 0 refills | Status: DC

## 2021-07-10 NOTE — Telephone Encounter (Signed)
Received refill request for adderall.  ?Last OV was on 05/29/21.  ?Next OV is scheduled for 11/17/21 .  ?Last RX was written on 06/10/21 for 60 tabs.  ? ?[place] Drug Database has been reviewed.  ?

## 2021-07-14 ENCOUNTER — Encounter: Payer: Self-pay | Admitting: Neurology

## 2021-07-15 ENCOUNTER — Other Ambulatory Visit: Payer: Self-pay | Admitting: *Deleted

## 2021-07-15 MED ORDER — METHYLPHENIDATE HCL 20 MG PO TABS: 20.0000 mg | ORAL_TABLET | Freq: Two times a day (BID) | ORAL | 0 refills | Status: DC

## 2021-07-21 ENCOUNTER — Telehealth: Payer: Self-pay | Admitting: Neurology

## 2021-07-21 NOTE — Telephone Encounter (Signed)
New order faxed, received fax confirmation. ?

## 2021-07-21 NOTE — Telephone Encounter (Signed)
Pt is requesting a refill for natalizumab (TYSABRI) 300 MG/15ML injection. The Rx has expired ? ?Pharmacy: [facility]  ?Ph#[phone] fax# [phone] ?

## 2021-08-14 ENCOUNTER — Encounter: Payer: Self-pay | Admitting: Neurology

## 2021-08-14 ENCOUNTER — Other Ambulatory Visit: Payer: Self-pay | Admitting: *Deleted

## 2021-08-14 MED ORDER — METHYLPHENIDATE HCL 20 MG PO TABS: 20.0000 mg | ORAL_TABLET | Freq: Two times a day (BID) | ORAL | 0 refills | Status: DC

## 2021-09-15 ENCOUNTER — Other Ambulatory Visit: Payer: Self-pay | Admitting: Neurology

## 2021-09-15 MED ORDER — METHYLPHENIDATE HCL 20 MG PO TABS
20.0000 mg | ORAL_TABLET | Freq: Two times a day (BID) | ORAL | 0 refills | Status: DC
Start: 2021-09-15 — End: 2021-10-21

## 2021-09-15 NOTE — Telephone Encounter (Signed)
Last OV was on 05/19/21.  ?Next OV is scheduled for 11/17/21.  ?Last RX was written on 08/18/21 for 60 tabs.  ? ?[place] Drug Database has been reviewed. Pharmacy called and verified, ok to fill as work in [place].  ?

## 2021-09-25 ENCOUNTER — Other Ambulatory Visit: Payer: Self-pay | Admitting: Neurology

## 2021-10-17 ENCOUNTER — Other Ambulatory Visit: Payer: Self-pay | Admitting: Neurology

## 2021-10-21 ENCOUNTER — Other Ambulatory Visit: Payer: Self-pay | Admitting: Psychiatry

## 2021-10-21 MED ORDER — METHYLPHENIDATE HCL 20 MG PO TABS: 20.0000 mg | ORAL_TABLET | Freq: Two times a day (BID) | ORAL | 0 refills | Status: DC

## 2021-11-17 ENCOUNTER — Ambulatory Visit (INDEPENDENT_AMBULATORY_CARE_PROVIDER_SITE_OTHER): Payer: Medicare Other | Admitting: Neurology

## 2021-11-17 ENCOUNTER — Telehealth: Payer: Self-pay | Admitting: Neurology

## 2021-11-17 ENCOUNTER — Encounter: Payer: Self-pay | Admitting: Neurology

## 2021-11-17 VITALS — BP 140/70 | HR 71 | Ht 68.0 in | Wt 268.0 lb

## 2021-11-17 DIAGNOSIS — Z79899 Other long term (current) drug therapy: Secondary | ICD-10-CM

## 2021-11-17 DIAGNOSIS — I480 Paroxysmal atrial fibrillation: Secondary | ICD-10-CM

## 2021-11-17 DIAGNOSIS — R269 Unspecified abnormalities of gait and mobility: Secondary | ICD-10-CM

## 2021-11-17 DIAGNOSIS — F988 Other specified behavioral and emotional disorders with onset usually occurring in childhood and adolescence: Secondary | ICD-10-CM

## 2021-11-17 DIAGNOSIS — G35 Multiple sclerosis: Secondary | ICD-10-CM | POA: Diagnosis not present

## 2021-11-17 DIAGNOSIS — G4700 Insomnia, unspecified: Secondary | ICD-10-CM

## 2021-11-17 NOTE — Progress Notes (Signed)
[facility]  PATIENT: Caitlin [name] DOB: [birth year]  REFERRING CLINICIAN: Fredderick Severance  HISTORY FROM: patient  REASON FOR VISIT: MS   HISTORICAL  CHIEF COMPLAINT:  Chief Complaint  Patient presents with   Follow-up    Rm 2, alone. 6 month f/u for MS, on Tysabri and tolerating well. No changes since last OV. No concerns.     HISTORY OF PRESENT ILLNESS:  Caitlin [name] is a 36 y.o. 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      Update 7/10/20233: She feels her MS has been stable.   She is on Tysabri and tolerates it well.   Her last JCV antibody was checked 04/24/2019 and was negative.  She denies any change in sensation, strength or gait.  She gets occasional left leg burning/tingling dysesthesias..   No falls.   She notes no new visual symptoms.  She continues to have some sequela of the optic neuritis on the right with desaturated colors.    She has noted difficulties with her bladder.    She will occasionally have leakage, especially if she coughs or sneezes  She continues to experience fatigue.  Adderall has helped.  Ritalin has not helped as much as Adderall (has trouble getting script filled for Adderall due to national shortage).    She has RLS helped by clonazepam.     She has some days when she notes some word finding issues, esp if tired, but overall cognition is doing well.     Migraines are better on Emgality but she currently has one.  She has only had one severe migraine and a few moderate ones over the last 6 months.  She takes NSAIDs, often with benefit if one occurs.    Triptans had not helped.  She has not tried an oral ani-CGRP agent.    Laying in a dark room helped also.    She does best if she falls asleep as they are better upon awakening.     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   She had ablation done recently and is in NSR  MS History:   She was diagnosed with MS in 2016 after presenting with optic neuritis.  A couple years  earlier, she had had possible transverse myelitis with leg weakness.  She was started on Tysabri as a DMT March 2016.    Despite a lot of symptoms, she has a very low plaque burden on the brain MRI and no lesions have been seen in her spine.   MS was diagnosed after an episode of optic neuritis and having a couple small non-specific foci on brain MRI.    A couple years earlier she had a possible transverse myelitis (vs mild GBS).        MRI brain from 2012 showed one small enhancing lesion at that time (read as nornal but reviewed at 2016 visit).  MRI in 2016 showed 4 foci, 3 not present in 2012.      IMAGING: MRI of the cervical spine 03/02/2016 was essentially normal. There was no evidence of cervical myelopathy and just disc bulge at 2 levels.  The MRI of the brain 06/01/2016 is unchanged compared to the MRI of the brain 01/31/2016 and showed no acute findings.      MRI of the brain 01/07/2019 showed several small T2/flair hyperintense foci in the left hemisphere and right cerebellum.  Though nonspecific, these could be consistent with chronic demyelinating plaque associated with multiple sclerosis.  None  of these appear to be acute and they were all present on previous MRIs.     No acute findings.   REVIEW OF SYSTEMS:  Constitutional: No fevers, chills, sweats, or change in appetite.   She has fatigue and insomnia Eyes: as above Ear, nose and throat: No hearing loss, ear pain, nasal congestion, sore throat Cardiovascular: No chest pain, palpitations Respiratory:  No shortness of breath at rest or with exertion.   No wheezes GastrointestinaI: No nausea, vomiting, diarrhea, abdominal pain, fecal incontinence Genitourinary:  She reports hesitancy.     She notes nocturia. Musculoskeletal:  No neck pain, some back pain Integumentary: No rash, pruritus, skin lesions Neurological: as above Psychiatric: Some depression at this time.  Some anxiety Endocrine: No palpitations, diaphoresis, change in  appetite, change in weigh or increased thirst  ALLERGIES: Allergies  Allergen Reactions   Ciprofloxacin Anaphylaxis   Influenza Vaccines Other (See Comments)    Hx of Guillain Barre    HOME MEDICATIONS: Outpatient Medications Prior to Visit  Medication Sig Dispense Refill   amphetamine-dextroamphetamine (ADDERALL) 20 MG tablet Take 1 tablet (20 mg total) by mouth 2 (two) times daily. 60 tablet 0   cholecalciferol (VITAMIN D3) 25 MCG (1000 UNIT) tablet Take 1,000 Units by mouth daily.     clonazePAM (KLONOPIN) 1 MG tablet TAKE 1/2 TO 1 TABLET BY MOUTH AT BEDTIME 30 tablet 5   Galcanezumab-gnlm (EMGALITY) 120 MG/ML SOAJ INJECT 120 MG SUBCUTANEOUSLY ONCE EVERY 30 DAYS 3 mL 4   methylphenidate (RITALIN) 20 MG tablet Take 1 tablet (20 mg total) by mouth 2 (two) times daily. 60 tablet 0   natalizumab (TYSABRI) 300 MG/15ML injection Inject into the vein.     rOPINIRole (REQUIP) 0.5 MG tablet TAKE 1 TO 2 TABLETS BY MOUTH ONCE DAILY AS NEEDED FOR RLS 180 tablet 0   flecainide (TAMBOCOR) 100 MG tablet Take 100 mg by mouth daily as needed.     No facility-administered medications prior to visit.    PAST MEDICAL HISTORY: Past Medical History:  Diagnosis Date   Back pain    Guillain Barr syndrome (HCC)    Headache    MS (multiple sclerosis) (HCC)    Pseudoseizures    Vision abnormalities     PAST SURGICAL HISTORY: Past Surgical History:  Procedure Laterality Date   BACK SURGERY     CESAREAN SECTION  2012   CHOLECYSTECTOMY, LAPAROSCOPIC  2004   KNEE SURGERY     LUMBAR LAMINECTOMY     VAGINAL HYSTERECTOMY      FAMILY HISTORY: Family History  Problem Relation Age of Onset   Diabetes type II Mother    Hypertension Mother    Fibromyalgia Mother    Cardiomyopathy Father    Heart attack Father     SOCIAL HISTORY:  Social History   Socioeconomic History   Marital status: Married    Spouse name: Not on file   Number of children: Not on file   Years of education: Not on  file   Highest education level: Not on file  Occupational History   Not on file  Tobacco Use   Smoking status: Former    Packs/day: 1.00    Types: Cigarettes   Smokeless tobacco: Never  Vaping Use   Vaping Use: Every day   Substances: Nicotine, Flavoring  Substance and Sexual Activity   Alcohol use: No    Alcohol/week: 0.0 standard drinks of alcohol   Drug use: No   Sexual activity: Not on file  Other Topics Concern   Not on file  Social History Narrative   Not on file   Social Determinants of Health   Financial Resource Strain: Not on file  Food Insecurity: Not on file  Transportation Needs: Not on file  Physical Activity: Not on file  Stress: Not on file  Social Connections: Not on file  Intimate Partner Violence: Not on file     PHYSICAL EXAM  Vitals:   11/17/21 1051  BP: 140/70  Pulse: 71  Weight: 268 lb (121.6 kg)  Height: 5\' 8"  (1.727 m)    Body mass index is 40.75 kg/m.   General: The patient is well-developed and well-nourished and in mild distress from chronic migraine.  Neck range of motion is normal.  Neurologic Exam  Mental status: The patient is alert and oriented x 3 at the time of the examination.  Normal memory and attention.     Cranial nerves: EOM intact.  There is reduced color vision OD     Speech is normal.    Normal facial strength and slightly decreased left sensation.   SCM/trapezius are strong.   Hearing is symmetric         Motor:  Muscle bulk and tone are normal. Strength is  5 / 5 in the arms.  Strength was 5/5 except for 4+/5 left toe, foot and ankle extension.  Coordination:   Finger-nose-finger is normal bilaterally.  Left Heel to shin is mildly reduced.  .   Sensory: On sensory testing she reports decreased sensation to vibration and temperature in the left leg.  Sensation is symmetric in arms       Gait and station: Station is normal.  The gait is normal.  Tandem gait is wide.  Romberg is negative.  Reflexes: DTRs were  mildly asymmetric, increased on her left leg.        ASSESSMENT AND PLAN 1. Multiple sclerosis ([place])   2. High risk medication use   3. Paroxysmal atrial fibrillation (HCC)   4. Insomnia, unspecified type   5. 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   Continue Tysabri.  (Does at [facility]).   Check CBC and JCV Ab today. 2.   For migraines, she will continue  Emgality for chronic migraines .     I gave sample of Nurtec 75 mg today 3.   Continue Adderall for fatigue, and clonazepam for sleep/anxiety 4.   Stay active and exercise as tolerated Return in 6 months or sooner if there are new or worsening neurologic symptoms.     Nimsi Males A. Felecia Shelling, MD, PhD [id number], [id number] AM Certified in Neurology, Clinical Neurophysiology, Sleep Medicine, Pain Medicine and Neuroimaging  [facility] [address], [place], [place] [address] [phone]

## 2021-11-17 NOTE — Telephone Encounter (Signed)
Placed [facility] lab in [facility] lock box for routine lab pick up. Results pending. 

## 2021-11-18 LAB — CBC WITH DIFFERENTIAL/PLATELET
Basophils Absolute: 0 10*3/uL (ref 0.0–0.2)
Basos: 1 %
EOS (ABSOLUTE): 0.1 10*3/uL (ref 0.0–0.4)
Eos: 2 %
Hematocrit: 38.3 % (ref 34.0–46.6)
Hemoglobin: 13.2 g/dL (ref 11.1–15.9)
Immature Grans (Abs): 0 10*3/uL (ref 0.0–0.1)
Immature Granulocytes: 0 %
Lymphocytes Absolute: 2.4 10*3/uL (ref 0.7–3.1)
Lymphs: 45 %
MCH: 29.8 pg (ref 26.6–33.0)
MCHC: 34.5 g/dL (ref 31.5–35.7)
MCV: 87 fL (ref 79–97)
Monocytes Absolute: 0.6 10*3/uL (ref 0.1–0.9)
Monocytes: 12 %
NRBC: 1 % — ABNORMAL HIGH (ref 0–0)
Neutrophils Absolute: 2.1 10*3/uL (ref 1.4–7.0)
Neutrophils: 40 %
Platelets: 203 10*3/uL (ref 150–450)
RBC: 4.43 x10E6/uL (ref 3.77–5.28)
RDW: 14.7 % (ref 11.7–15.4)
WBC: 5.2 10*3/uL (ref 3.4–10.8)

## 2021-11-24 NOTE — Telephone Encounter (Signed)
JCV ab drawn on 11/17/21 negative, index: 0.18

## 2021-12-01 ENCOUNTER — Other Ambulatory Visit: Payer: Self-pay | Admitting: Neurology

## 2021-12-01 NOTE — Telephone Encounter (Signed)
Last OV was on 11/17/21.  Next OV is scheduled for 05/27/22.  Last RX was written on 10/23/21 for 30 tabs.   [place] [facility] Database has been reviewed.

## 2021-12-02 ENCOUNTER — Other Ambulatory Visit: Payer: Self-pay | Admitting: Neurology

## 2021-12-02 MED ORDER — METHYLPHENIDATE HCL 20 MG PO TABS: 20.0000 mg | ORAL_TABLET | Freq: Two times a day (BID) | ORAL | 0 refills | Status: DC

## 2021-12-02 NOTE — Telephone Encounter (Signed)
Last OV was on 11/17/21.  Next OV is scheduled for 05/27/21.  Last RX was written on 10/30/21 for 60 tabs.   [place] [facility] Database has been reviewed.

## 2021-12-10 IMAGING — DX DG CHEST 1V PORT
1 series · 1 of 1 positions shown · non-contrast
Comparison: November 16, 2018

CLINICAL DATA: Fever and chest pain.

EXAM:
PORTABLE CHEST 1 VIEW

[chest ap]
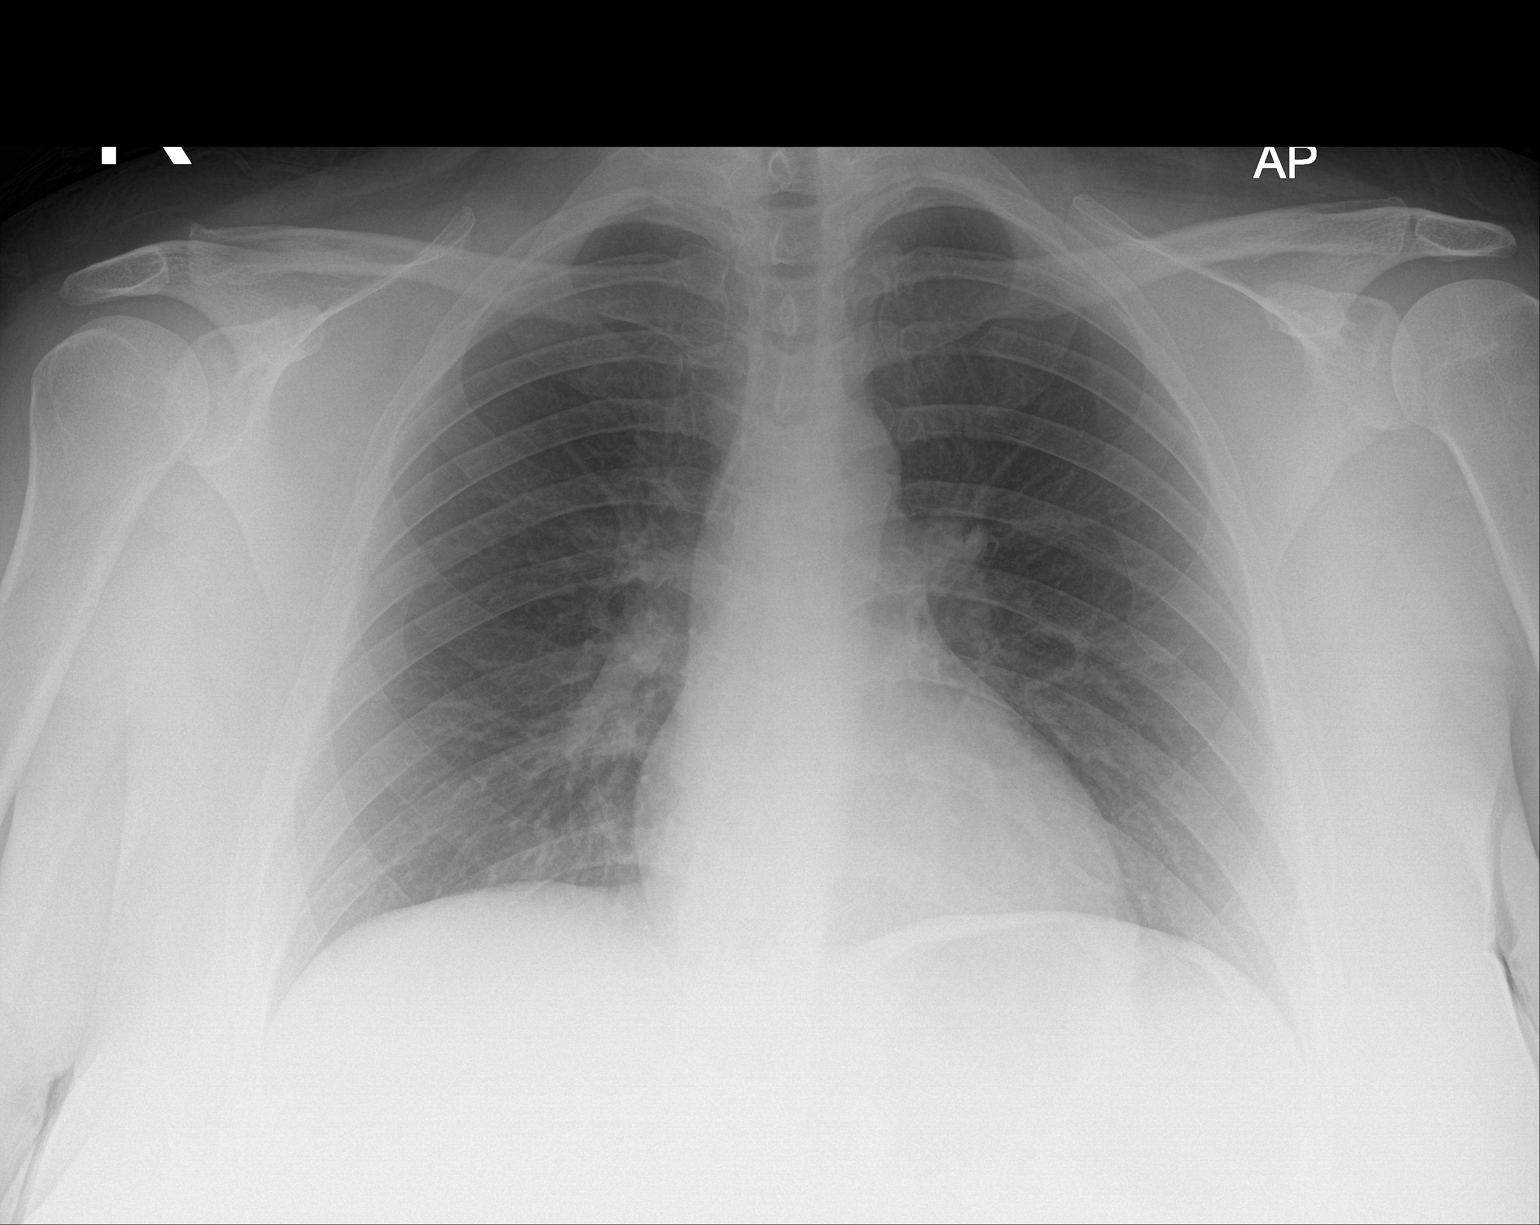

[1 of 1 positions shown; findings below may reference images not displayed]

FINDINGS: The heart size and mediastinal contours are within normal limits.
Both lungs are clear. The visualized skeletal structures are
unremarkable.
IMPRESSION: No active disease.

## 2022-01-19 ENCOUNTER — Other Ambulatory Visit: Payer: Self-pay | Admitting: Neurology

## 2022-01-20 ENCOUNTER — Other Ambulatory Visit: Payer: Self-pay | Admitting: Neurology

## 2022-01-21 ENCOUNTER — Encounter: Payer: Self-pay | Admitting: Neurology

## 2022-01-21 MED ORDER — METHYLPHENIDATE HCL 20 MG PO TABS: 20.0000 mg | ORAL_TABLET | Freq: Two times a day (BID) | ORAL | 0 refills | Status: DC

## 2022-01-21 MED ORDER — CLONAZEPAM 1 MG PO TABS: ORAL_TABLET | ORAL | 5 refills | Status: DC

## 2022-01-24 ENCOUNTER — Other Ambulatory Visit: Payer: Self-pay | Admitting: Neurology

## 2022-03-02 ENCOUNTER — Other Ambulatory Visit: Payer: Self-pay | Admitting: Neurology

## 2022-03-02 MED ORDER — METHYLPHENIDATE HCL 20 MG PO TABS: 20.0000 mg | ORAL_TABLET | Freq: Two times a day (BID) | ORAL | 0 refills | Status: DC

## 2022-04-19 ENCOUNTER — Other Ambulatory Visit: Payer: Self-pay | Admitting: Neurology

## 2022-04-20 MED ORDER — METHYLPHENIDATE HCL 20 MG PO TABS: 20.0000 mg | ORAL_TABLET | Freq: Two times a day (BID) | ORAL | 0 refills | Status: DC

## 2022-04-29 ENCOUNTER — Other Ambulatory Visit: Payer: Self-pay | Admitting: Neurology

## 2022-05-05 ENCOUNTER — Encounter: Payer: Self-pay | Admitting: Neurology

## 2022-05-27 ENCOUNTER — Ambulatory Visit: Payer: No Typology Code available for payment source | Admitting: Family Medicine

## 2022-06-01 ENCOUNTER — Ambulatory Visit (INDEPENDENT_AMBULATORY_CARE_PROVIDER_SITE_OTHER): Payer: Medicare Other | Admitting: Neurology

## 2022-06-01 ENCOUNTER — Encounter: Payer: Self-pay | Admitting: Neurology

## 2022-06-01 ENCOUNTER — Telehealth: Payer: Self-pay | Admitting: *Deleted

## 2022-06-01 ENCOUNTER — Telehealth: Payer: Self-pay | Admitting: Neurology

## 2022-06-01 VITALS — BP 155/82 | HR 75 | Ht 68.0 in | Wt 277.5 lb

## 2022-06-01 DIAGNOSIS — G35 Multiple sclerosis: Secondary | ICD-10-CM

## 2022-06-01 DIAGNOSIS — R269 Unspecified abnormalities of gait and mobility: Secondary | ICD-10-CM | POA: Diagnosis not present

## 2022-06-01 DIAGNOSIS — E559 Vitamin D deficiency, unspecified: Secondary | ICD-10-CM

## 2022-06-01 DIAGNOSIS — Z79899 Other long term (current) drug therapy: Secondary | ICD-10-CM | POA: Diagnosis not present

## 2022-06-01 DIAGNOSIS — R5382 Chronic fatigue, unspecified: Secondary | ICD-10-CM

## 2022-06-01 DIAGNOSIS — F988 Other specified behavioral and emotional disorders with onset usually occurring in childhood and adolescence: Secondary | ICD-10-CM

## 2022-06-01 MED ORDER — METHYLPHENIDATE HCL 20 MG PO TABS: 20.0000 mg | ORAL_TABLET | Freq: Two times a day (BID) | ORAL | 0 refills | Status: DC

## 2022-06-01 MED ORDER — CLONAZEPAM 1 MG PO TABS: ORAL_TABLET | ORAL | 5 refills | Status: DC

## 2022-06-01 NOTE — Telephone Encounter (Signed)
Placed [facility] lab in [facility] lock box for routine lab pick up. Results pending. 

## 2022-06-01 NOTE — Progress Notes (Signed)
[facility]  PATIENT: Caitlin [name] DOB: 02/28/1986  REFERRING CLINICIAN: Kennon Holter  HISTORY FROM: patient  REASON FOR VISIT: MS   HISTORICAL  CHIEF COMPLAINT:  Chief Complaint  Patient presents with   Follow-up    Patient here in room 2 for follow up MS. Reports for 2 weeks left side hand locks up and she has to push to open hand. Left leg feels heavy to pick up, big toe feels like she has having spasms.     HISTORY OF PRESENT ILLNESS:  Caitlin [name] is a 37 y.o. 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      Update 06/01/2022 She denies exacerbations but has had more spasms in her left  arm and left foot.    Once when she put her head back, she noted more spasms.     She is on Tysabri and tolerates it well.   Her last JCV antibody was checked 11/17/2021 and was negative at 0.18.   She has moved back to this area and will need to get established at a local infusion center  She denies any change in strength or gait.  She gets occasional left leg burning/tingling dysesthesias..   No falls.   She notes no new visual symptoms.  She continues to have some sequela of the optic neuritis on the right with desaturated colors.    Bladder is usually fine but occasional stress incontinence.    She has fatigue.   Adderall has helped more than ritalin.    She has RLS helped by clonazepam.     She has some days when she notes some word finding issues, esp if tired, but overall cognition is doing well.     Migraines are better on Emgality but she currently has one.  She has only had one severe migraine and a few moderate ones over the last 6 months.  She takes NSAIDs, often with benefit if one occurs.    Triptans had not helped.  She has not tried an oral ani-CGRP agent.    Laying in a dark room helped also.    She does best if she falls asleep as they are better upon awakening.     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   She had ablation done recently and is in  NSR.  She will br seeing Dr. Elonda Husky  MS History:   She was diagnosed with MS in 2016 after presenting with optic neuritis.  A couple years earlier, she had had possible transverse myelitis with leg weakness.  She was started on Tysabri as a DMT March 2016.    Despite a lot of symptoms, she has a very low plaque burden on the brain MRI and no lesions have been seen in her spine.   MS was diagnosed after an episode of optic neuritis and having a couple small non-specific foci on brain MRI.    A couple years earlier she had a possible transverse myelitis (vs mild GBS).        MRI brain from 2012 showed one small enhancing lesion at that time (read as nornal but reviewed at 2016 visit).  MRI in 2016 showed 4 foci, 3 not present in 2012.      IMAGING: MRI of the cervical spine 03/02/2016 was essentially normal. There was no evidence of cervical myelopathy and just disc bulge at 2 levels.  The MRI of the brain 06/01/2016 is unchanged compared to the MRI of the brain 01/31/2016  and showed no acute findings.      MRI of the brain 01/07/2019 showed several small T2/flair hyperintense foci in the left hemisphere and right cerebellum.  Though nonspecific, these could be consistent with chronic demyelinating plaque associated with multiple sclerosis.  None of these appear to be acute and they were all present on previous MRIs.     No acute findings.   REVIEW OF SYSTEMS:  Constitutional: No fevers, chills, sweats, or change in appetite.   She has fatigue and insomnia Eyes: as above Ear, nose and throat: No hearing loss, ear pain, nasal congestion, sore throat Cardiovascular: No chest pain, palpitations Respiratory:  No shortness of breath at rest or with exertion.   No wheezes GastrointestinaI: No nausea, vomiting, diarrhea, abdominal pain, fecal incontinence Genitourinary:  She reports hesitancy.     She notes nocturia. Musculoskeletal:  No neck pain, some back pain Integumentary: No rash, pruritus, skin  lesions Neurological: as above Psychiatric: Some depression at this time.  Some anxiety Endocrine: No palpitations, diaphoresis, change in appetite, change in weigh or increased thirst  ALLERGIES: Allergies  Allergen Reactions   Ciprofloxacin Anaphylaxis   Influenza Vaccines Other (See Comments)    Hx of Guillain Barre    HOME MEDICATIONS: Outpatient Medications Prior to Visit  Medication Sig Dispense Refill   amphetamine-dextroamphetamine (ADDERALL) 20 MG tablet Take 1 tablet (20 mg total) by mouth 2 (two) times daily. 60 tablet 0   cholecalciferol (VITAMIN D3) 25 MCG (1000 UNIT) tablet Take 1,000 Units by mouth daily.     clonazePAM (KLONOPIN) 1 MG tablet TAKE 1/2-1 TABLET BY MOUTH AT BEDTIME 30 tablet 5   Galcanezumab-gnlm (EMGALITY) 120 MG/ML SOAJ INJECT 1 SYRINGE UNDER THE SKIN ONCE A MONTH 3 mL 3   methylphenidate (RITALIN) 20 MG tablet Take 1 tablet (20 mg total) by mouth 2 (two) times daily. 60 tablet 0   natalizumab (TYSABRI) 300 MG/15ML injection Inject into the vein.     rOPINIRole (REQUIP) 0.5 MG tablet TAKE 1 TO 2 TABLETS BY MOUTH ONCE DAILY AS NEEDED FOR RLS 180 tablet 0   flecainide (TAMBOCOR) 100 MG tablet Take 100 mg by mouth daily as needed.     No facility-administered medications prior to visit.    PAST MEDICAL HISTORY: Past Medical History:  Diagnosis Date   Back pain    Guillain Barr syndrome ([place])    Headache    MS (multiple sclerosis) (HCC)    Pseudoseizures    Vision abnormalities     PAST SURGICAL HISTORY: Past Surgical History:  Procedure Laterality Date   BACK SURGERY     CESAREAN SECTION  2012   CHOLECYSTECTOMY, LAPAROSCOPIC  2004   KNEE SURGERY     LUMBAR LAMINECTOMY     VAGINAL HYSTERECTOMY      FAMILY HISTORY: Family History  Problem Relation Age of Onset   Diabetes type II Mother    Hypertension Mother    Fibromyalgia Mother    Cardiomyopathy Father    Heart attack Father     SOCIAL HISTORY:  Social History    Socioeconomic History   Marital status: Married    Spouse name: Not on file   Number of children: Not on file   Years of education: Not on file   Highest education level: Not on file  Occupational History   Not on file  Tobacco Use   Smoking status: Former    Packs/day: 1.00    Types: Cigarettes   Smokeless tobacco: Never  Vaping  Use   Vaping Use: Every day   Substances: Nicotine, Flavoring  Substance and Sexual Activity   Alcohol use: No    Alcohol/week: 0.0 standard drinks of alcohol   Drug use: No   Sexual activity: Not on file  Other Topics Concern   Not on file  Social History Narrative   Not on file   Social Determinants of Health   Financial Resource Strain: Not on file  Food Insecurity: Not on file  Transportation Needs: Not on file  Physical Activity: Not on file  Stress: Not on file  Social Connections: Not on file  Intimate Partner Violence: Not on file     PHYSICAL EXAM  Vitals:   06/01/22 1333  BP: (!) 155/82  Pulse: 75  Weight: 277 lb 8 oz (125.9 kg)  Height: 5\' 8"  (1.727 m)    Body mass index is 42.19 kg/m.   General: The patient is well-developed and well-nourished and in mild distress from chronic migraine.  Neck range of motion is normal.  Neurologic Exam  Mental status: The patient is alert and oriented x 3 at the time of the examination.  Normal memory and attention.     Cranial nerves: EOM intact.  There is reduced color vision OD     Speech is normal.    Normal facial strength and slightly decreased left sensation.   SCM/trapezius are strong.   Hearing is symmetric         Motor:  Muscle bulk and tone are normal. Strength is  5 / 5 in the arms.  Strength was 5/5 except for 4+/5 left toe, foot and ankle extension.  Coordination:   Finger-nose-finger is normal bilaterally.  Left Heel to shin is mildly reduced.  .   Sensory: On sensory testing she reports decreased sensation to vibration and temperature in the left leg.  Sensation  is symmetric in arms       Gait and station: Station is normal.  The gait is normal.  Tandem gait is wide.  Romberg is negative.  Reflexes: DTRs were mildly asymmetric, increased on her left leg.        ASSESSMENT AND PLAN 1. Multiple sclerosis (HCC)   2. High risk medication use   3. Gait disturbance   4. Attention deficit disorder (ADD) without hyperactivity   5. Chronic fatigue   6. Vitamin D deficiency      1.   Continue Tysabri.  Check CBC and JCV Ab today.  She will need to get reestablished at an infusion center [place].   For migraines, she will continue  Emgality for chronic migraines .     I gave sample of Nurtec 75 mg today 3.   Continue Adderall or Ritalin for fatigue, and clonazepam for sleep/anxiety 4.   Stay active and exercise as tolerated Return in 6 months or sooner if there are new or worsening neurologic symptoms.     Eusebio Blazejewski A. , MD, PhD 06/01/2022, 1:45 PM Certified in Neurology, Clinical Neurophysiology, Sleep Medicine, Pain Medicine and Neuroimaging  [facility] [address] [place], [place] [phone]

## 2022-06-01 NOTE — Telephone Encounter (Signed)
[employer] medicare NPR sent to GI [phone]

## 2022-06-02 ENCOUNTER — Telehealth: Payer: Self-pay | Admitting: *Deleted

## 2022-06-02 LAB — COMPREHENSIVE METABOLIC PANEL
ALT: 24 IU/L (ref 0–32)
AST: 19 IU/L (ref 0–40)
Albumin/Globulin Ratio: 1.7 (ref 1.2–2.2)
Albumin: 4.4 g/dL (ref 3.9–4.9)
Alkaline Phosphatase: 78 IU/L (ref 44–121)
BUN/Creatinine Ratio: 26 — ABNORMAL HIGH (ref 9–23)
BUN: 14 mg/dL (ref 6–20)
Bilirubin Total: 0.3 mg/dL (ref 0.0–1.2)
CO2: 22 mmol/L (ref 20–29)
Calcium: 9.2 mg/dL (ref 8.7–10.2)
Chloride: 102 mmol/L (ref 96–106)
Creatinine, Ser: 0.54 mg/dL — ABNORMAL LOW (ref 0.57–1.00)
Globulin, Total: 2.6 g/dL (ref 1.5–4.5)
Glucose: 95 mg/dL (ref 70–99)
Potassium: 4.3 mmol/L (ref 3.5–5.2)
Sodium: 140 mmol/L (ref 134–144)
Total Protein: 7 g/dL (ref 6.0–8.5)
eGFR: 121 mL/min/{1.73_m2} (ref >59)

## 2022-06-02 LAB — CBC WITH DIFFERENTIAL/PLATELET
Basophils Absolute: 0 10*3/uL (ref 0.0–0.2)
Basos: 0 %
EOS (ABSOLUTE): 0.3 10*3/uL (ref 0.0–0.4)
Eos: 3 %
Hematocrit: 45.6 % (ref 34.0–46.6)
Hemoglobin: 15 g/dL (ref 11.1–15.9)
Immature Grans (Abs): 0 10*3/uL (ref 0.0–0.1)
Immature Granulocytes: 0 %
Lymphocytes Absolute: 3.9 10*3/uL — ABNORMAL HIGH (ref 0.7–3.1)
Lymphs: 37 %
MCH: 28.9 pg (ref 26.6–33.0)
MCHC: 32.9 g/dL (ref 31.5–35.7)
MCV: 88 fL (ref 79–97)
Monocytes Absolute: 0.6 10*3/uL (ref 0.1–0.9)
Monocytes: 6 %
Neutrophils Absolute: 5.6 10*3/uL (ref 1.4–7.0)
Neutrophils: 54 %
Platelets: 246 10*3/uL (ref 150–450)
RBC: 5.19 x10E6/uL (ref 3.77–5.28)
RDW: 14.9 % (ref 11.7–15.4)
WBC: 10.5 10*3/uL (ref 3.4–10.8)

## 2022-06-02 LAB — VITAMIN D 25 HYDROXY (VIT D DEFICIENCY, FRACTURES): Vit D, 25-Hydroxy: 19.7 ng/mL — ABNORMAL LOW (ref 30.0–100.0)

## 2022-06-02 LAB — MAGNESIUM: Magnesium: 1.9 mg/dL (ref 1.6–2.3)

## 2022-06-02 NOTE — Telephone Encounter (Signed)
Faxed complete/signed Tysabri start form to [facility] prescribing program at [phone]. Received fax confirmation. Gave completed start form to [facility] for them to process. Included signed order for [facility].  Sent copy of start form to be scanned to epic.

## 2022-06-08 ENCOUNTER — Other Ambulatory Visit: Payer: Self-pay | Admitting: Neurology

## 2022-06-08 MED ORDER — VITAMIN D (ERGOCALCIFEROL) 1.25 MG (50000 UNIT) PO CAPS: 50000.0000 [IU] | ORAL_CAPSULE | ORAL | 1 refills | Status: DC

## 2022-06-08 NOTE — Telephone Encounter (Signed)
JCV returned negative index antibody 0.13

## 2022-06-14 ENCOUNTER — Encounter: Payer: Self-pay | Admitting: Neurology

## 2022-06-15 ENCOUNTER — Other Ambulatory Visit: Payer: Medicare Other

## 2022-06-15 NOTE — Telephone Encounter (Signed)
Sent message to [facility] to see if they are out of network/reason for high cost. Waiting on response.

## 2022-06-16 NOTE — Telephone Encounter (Signed)
[facility] sent message to intake department. They believe she has out of pocket cost to meet first for the new year/not an issue with them being out of network. Waiting on response.

## 2022-06-16 NOTE — Telephone Encounter (Signed)
   These are the notes from infusion suite. Looks like since she has [employer], she does not qualify for pt assistance and no foundations have availability to help right now

## 2022-06-17 NOTE — Telephone Encounter (Signed)
Per [facility]: pt enrolled in free drug today. Dr. Felecia Shelling approved infusing her at [facility] with free drug. I notified [facility] of update.

## 2022-06-17 NOTE — Telephone Encounter (Signed)
I received this notice from [place] at [place]: "I will request a benefits investigation from her insurance & follow up with Estill Bamberg. 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 I'll try and get this complete today, as I am out of the office tomorrow and Friday. I'll provide an update asap!"

## 2022-06-19 ENCOUNTER — Ambulatory Visit
Admission: RE | Admit: 2022-06-19 | Discharge: 2022-06-19 | Disposition: A | Discharge status: Home / Self Care | Payer: Medicare Other | Source: Ambulatory Visit | Attending: Neurology | Admitting: Neurology

## 2022-06-19 DIAGNOSIS — G35 Multiple sclerosis: Secondary | ICD-10-CM | POA: Diagnosis not present

## 2022-06-19 DIAGNOSIS — R269 Unspecified abnormalities of gait and mobility: Secondary | ICD-10-CM

## 2022-06-19 MED ORDER — GADOPICLENOL 0.5 MMOL/ML IV SOLN
10.0000 mL | Freq: Once | INTRAVENOUS | Status: AC | PRN
  Administered 2022-06-19: 10 mL via INTRAVENOUS

## 2022-07-15 ENCOUNTER — Other Ambulatory Visit: Payer: Self-pay | Admitting: Neurology

## 2022-07-16 MED ORDER — METHYLPHENIDATE HCL 20 MG PO TABS: 20.0000 mg | ORAL_TABLET | Freq: Two times a day (BID) | ORAL | 0 refills | Status: DC

## 2022-07-20 ENCOUNTER — Other Ambulatory Visit: Payer: Self-pay | Admitting: Neurology

## 2022-07-20 NOTE — Telephone Encounter (Signed)
Last seen on 06/01/22 Follow up scheduled on 12/07/22 Last filled on 04/30/23 # 90 tablets  I didn't see in recent note to continue Rx.

## 2022-08-17 ENCOUNTER — Other Ambulatory Visit: Payer: Self-pay | Admitting: Neurology

## 2022-08-17 MED ORDER — METHYLPHENIDATE HCL 20 MG PO TABS: 20.0000 mg | ORAL_TABLET | Freq: Two times a day (BID) | ORAL | 0 refills | Status: DC

## 2022-09-21 ENCOUNTER — Other Ambulatory Visit: Payer: Self-pay | Admitting: Neurology

## 2022-09-21 MED ORDER — METHYLPHENIDATE HCL 20 MG PO TABS: 20.0000 mg | ORAL_TABLET | Freq: Two times a day (BID) | ORAL | 0 refills | Status: DC

## 2022-09-21 NOTE — Telephone Encounter (Signed)
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   Continue Adderall or Ritalin for fatigue, and clonazepam for sleep/anxiety '  Follow up scheduled on 12/07/22   Last filled on 08/17/22 #60 tablets (30 day supply)  Rx pending to be signed

## 2022-10-21 ENCOUNTER — Other Ambulatory Visit: Payer: Self-pay | Admitting: Neurology

## 2022-10-21 MED ORDER — METHYLPHENIDATE HCL 20 MG PO TABS: 20.0000 mg | ORAL_TABLET | Freq: Two times a day (BID) | ORAL | 0 refills | Status: DC

## 2022-10-21 MED ORDER — ROPINIROLE HCL 0.5 MG PO TABS: ORAL_TABLET | ORAL | 0 refills | Status: DC

## 2022-10-21 NOTE — Telephone Encounter (Signed)
Requested Prescriptions   Pending Prescriptions Disp Refills   methylphenidate (RITALIN) 20 MG tablet 60 tablet 0    Sig: Take 1 tablet (20 mg total) by mouth 2 (two) times daily.   Last seen 06/01/22, next appt scheduled for  12/07/22 Dispenses  Dr. Bonnita Hollow last note stated:  Continue Adderall or Ritalin for fatigue, and clonazepam for sleep/anxiety  Dispensed Days Supply Quantity Provider Pharmacy  METHYLPHENIDATE 20 MG TABLET 09/21/2022 30 60 each Micki Riley, MD [facility] [phone] - T...  METHYLPHENIDATE 20 MG TABLET 08/17/2022 30 60 each Sater, Pearletha Furl, MD [facility] [phone] - T...  METHYLPHENIDATE 20 MG TABLET 07/16/2022 30 60 each Sater, Pearletha Furl, MD [facility] [phone] - T...  METHYLPHENIDATE 20 MG TABLET 06/10/2022 30 60 each Sater, Pearletha Furl, MD [facility] [phone] - T...  METHYLPHENIDATE 20 MG TABLET 04/20/2022 30 60 each Sater, Pearletha Furl, MD [facility] [phone] - N...  METHYLPHENIDATE 20 MG TABLET 03/02/2022 30 60 each Sater, Pearletha Furl, MD [facility] [phone] - N...  METHYLPHENIDATE 20 MG TABLET 01/21/2022 30 60 each Sater, Pearletha Furl, MD [facility] [phone] - N...  METHYLPHENIDATE 20 MG TABLET 12/02/2021 30 60 each Sater, Pearletha Furl, MD [facility] [phone] - N...  METHYLPHENIDATE 20 MG TABLET 10/30/2021 30 60 each Sater, Pearletha Furl, MD [facility] [phone] - N.Marland KitchenMarland Kitchen

## 2022-10-21 NOTE — Telephone Encounter (Signed)
Last office visit: 06/01/2022 Next office visit: 12/07/2022

## 2022-11-18 ENCOUNTER — Other Ambulatory Visit: Payer: Self-pay | Admitting: *Deleted

## 2022-11-18 ENCOUNTER — Other Ambulatory Visit: Payer: Self-pay

## 2022-11-18 ENCOUNTER — Telehealth: Payer: Self-pay | Admitting: *Deleted

## 2022-11-18 DIAGNOSIS — Z79899 Other long term (current) drug therapy: Secondary | ICD-10-CM

## 2022-11-18 DIAGNOSIS — G35 Multiple sclerosis: Secondary | ICD-10-CM

## 2022-11-18 NOTE — Telephone Encounter (Signed)
Placed [facility] lab in [facility] lock box for routine lab pick up. Results pending. 

## 2022-11-19 ENCOUNTER — Other Ambulatory Visit: Payer: Self-pay | Admitting: Neurology

## 2022-11-19 LAB — CBC WITH DIFFERENTIAL/PLATELET
Basophils Absolute: 0.1 10*3/uL (ref 0.0–0.2)
Basos: 1 %
EOS (ABSOLUTE): 0.4 10*3/uL (ref 0.0–0.4)
Eos: 3 %
Hematocrit: 39.8 % (ref 34.0–46.6)
Hemoglobin: 12.9 g/dL (ref 11.1–15.9)
Immature Grans (Abs): 0.1 10*3/uL (ref 0.0–0.1)
Immature Granulocytes: 1 %
Lymphocytes Absolute: 4.2 10*3/uL — ABNORMAL HIGH (ref 0.7–3.1)
Lymphs: 40 %
MCH: 28.7 pg (ref 26.6–33.0)
MCHC: 32.4 g/dL (ref 31.5–35.7)
MCV: 89 fL (ref 79–97)
Monocytes Absolute: 0.6 10*3/uL (ref 0.1–0.9)
Monocytes: 6 %
NRBC: 1 % — ABNORMAL HIGH (ref 0–0)
Neutrophils Absolute: 5.3 10*3/uL (ref 1.4–7.0)
Neutrophils: 49 %
Platelets: 248 10*3/uL (ref 150–450)
RBC: 4.49 x10E6/uL (ref 3.77–5.28)
RDW: 15.1 % (ref 11.7–15.4)
WBC: 10.5 10*3/uL (ref 3.4–10.8)

## 2022-11-20 MED ORDER — METHYLPHENIDATE HCL 20 MG PO TABS: 20.0000 mg | ORAL_TABLET | Freq: Two times a day (BID) | ORAL | 0 refills | Status: DC

## 2022-11-25 ENCOUNTER — Other Ambulatory Visit (HOSPITAL_COMMUNITY): Payer: Self-pay | Admitting: Surgery

## 2022-11-25 NOTE — Telephone Encounter (Signed)
JCV  Antibody Negative  Index 0.13   results received [facility] 11-25-2022.

## 2022-12-03 NOTE — Patient Instructions (Signed)
Below is our plan:  We will continue current treatment plan. I will help with transfer to [place] [facility] for your infusions.   Please make sure you are staying well hydrated. I recommend 50-60 ounces daily. Well balanced diet and regular exercise encouraged. Consistent sleep schedule with 6-8 hours recommended.   Please continue follow up with care team as directed.   Follow up with Dr Epimenio Foot in 6 months  You may receive a survey regarding today's visit. I encourage you to leave honest feed back as I do use this information to improve patient care. Thank you for seeing me today!

## 2022-12-03 NOTE — Progress Notes (Signed)
Chief Complaint  Patient presents with   Follow-up    Pt in room 1. Here for MS follow up. Pt reports being stable from MS standpoint on Tysabri. Pt scheduled next week for infusion.      HISTORY OF PRESENT ILLNESS: Today 12/07/22  Caitlin [name] is a 37 y.o. female here today for follow up for  RRMS. She continues Tysabri infusions. She is scheduled for infusion next week. She is tolerating well. JCV 0.13 11/2022. Last MRI stable in 06/2022.   She denies new or exacerbating symptoms. No changes in gait. No assistive device. Able to walk up stairs.   She continues Emgality for migraine management. Ibuprofen 800mg  helps with abortive therapy. She also continues methylphenidate 20mg  twice daily for inattention and MS fatigue. She continues clonazepam 1mg  and ropinirole 0.5-1mg  daily at bedtime for RLS and sleep. She is treated for OSA on CPAP therapy. Followed by [facility].   Urinary hesitancy is stable. No recent UTIs.   She is moving to [place] next week. Needs to transfer infusions.   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       Update 06/01/2022 She denies exacerbations but has had more spasms in her left  arm and left foot.    Once when she put her head back, she noted more spasms.     She is on Tysabri and tolerates it well.   Her last JCV antibody was checked 11/17/2021 and was negative at 0.18.   She has moved back to this area and will need to get established at a local infusion center   She denies any change in strength or gait.  She gets occasional left leg burning/tingling dysesthesias..   No falls.   She notes no new visual symptoms.  She continues to have some sequela of the optic neuritis on the right with desaturated colors.    Bladder is usually fine but occasional stress incontinence.     She has fatigue.   Adderall has helped more than ritalin.    She has RLS helped by clonazepam.      She has some days when she notes some word finding issues, esp if tired, but overall cognition is doing well.      Migraines are better on Emgality but she currently has one.  She has only had one severe migraine and a few moderate ones over the last 6 months.  She takes NSAIDs, often with benefit if one occurs.    Triptans had not helped.  She has not tried an oral ani-CGRP agent.    Laying in a dark room helped also.    She does best if she falls asleep as they are better upon awakening.      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   She had ablation done recently and is in NSR.  She will br seeing Dr. Chales Abrahams   MS History:   She was diagnosed with MS in 2016 after presenting with optic neuritis.  A couple years earlier, she had had possible transverse myelitis with leg weakness.  She was started on Tysabri as a DMT March 2016.    Despite a lot of symptoms, she has a very low plaque burden on the brain MRI and no lesions have been seen in her spine.   MS was diagnosed after an episode of optic neuritis and having a couple small non-specific  foci on brain MRI.    A couple years earlier she had a possible transverse myelitis (vs mild GBS).        MRI brain from 2012 showed one small enhancing lesion at that time (read as nornal but reviewed at 2016 visit).  MRI in 2016 showed 4 foci, 3 not present in 2012.      IMAGING: MRI of the cervical spine 03/02/2016 was essentially normal. There was no evidence of cervical myelopathy and just disc bulge at 2 levels.  The MRI of the brain 06/01/2016 is unchanged compared to the MRI of the brain 01/31/2016 and showed no acute findings.      MRI of the brain 01/07/2019 showed several small T2/flair hyperintense foci in the left hemisphere and right cerebellum.  Though nonspecific, these could be consistent with chronic demyelinating plaque associated with multiple sclerosis.  None of these appear to be acute and they were all present on previous MRIs.     No  acute findings.  REVIEW OF SYSTEMS: Out of a complete 14 system review of symptoms, the patient complains only of the following symptoms, gait disturbance, headaches, inattention, fatigue, and all other reviewed systems are negative.   ALLERGIES: Allergies  Allergen Reactions   Ciprofloxacin Anaphylaxis   Influenza Vaccines Other (See Comments)    Hx of Guillain Barre     HOME MEDICATIONS: Outpatient Medications Prior to Visit  Medication Sig Dispense Refill   cholecalciferol (VITAMIN D3) 25 MCG (1000 UNIT) tablet Take 1,000 Units by mouth daily.     clonazePAM (KLONOPIN) 1 MG tablet TAKE 1/2 TO 1 TABLET BY MOUTH AT BEDTIME 30 tablet 5   Galcanezumab-gnlm (EMGALITY) 120 MG/ML SOAJ INJECT 1 SYRINGE UNDER THE SKIN ONCE A MONTH 3 mL 3   methylphenidate (RITALIN) 20 MG tablet Take 1 tablet (20 mg total) by mouth 2 (two) times daily. 60 tablet 0   methylphenidate (RITALIN) 20 MG tablet Take 1 tablet (20 mg total) by mouth 2 (two) times daily. 60 tablet 0   natalizumab (TYSABRI) 300 MG/15ML injection Inject into the vein.     rOPINIRole (REQUIP) 0.5 MG tablet Take 1 to 2 Tablets by mouth once Daily as Needed for RLS 180 tablet 0   Vitamin D, Ergocalciferol, (DRISDOL) 1.25 MG (50000 UNIT) CAPS capsule Take 1 capsule (50,000 Units total) by mouth every 7 (seven) days. 13 capsule 1   flecainide (TAMBOCOR) 100 MG tablet Take 100 mg by mouth daily as needed.     No facility-administered medications prior to visit.     PAST MEDICAL HISTORY: Past Medical History:  Diagnosis Date   Back pain    Guillain Barr syndrome (HCC)    Headache    MS (multiple sclerosis) (HCC)    Pseudoseizures    Vision abnormalities      PAST SURGICAL HISTORY: Past Surgical History:  Procedure Laterality Date   BACK SURGERY     CESAREAN SECTION  2012   CHOLECYSTECTOMY, LAPAROSCOPIC  2004   KNEE SURGERY     LUMBAR LAMINECTOMY     VAGINAL HYSTERECTOMY       FAMILY HISTORY: Family History  Problem  Relation Age of Onset   Diabetes type II Mother    Hypertension Mother    Fibromyalgia Mother    Cardiomyopathy Father    Heart attack Father      SOCIAL HISTORY: Social History   Socioeconomic History   Marital status: Married    Spouse name: Not on file   Number  of children: Not on file   Years of education: Not on file   Highest education level: Not on file  Occupational History   Not on file  Tobacco Use   Smoking status: Former    Current packs/day: 1.00    Types: Cigarettes   Smokeless tobacco: Never  Vaping Use   Vaping status: Every Day   Substances: Nicotine, Flavoring  Substance and Sexual Activity   Alcohol use: No    Alcohol/week: 0.0 standard drinks of alcohol   Drug use: No   Sexual activity: Not on file  Other Topics Concern   Not on file  Social History Narrative   Not on file   Social Determinants of Health   Financial Resource Strain: Medium Risk (11/01/2022)   Received from [facility], [facility]   Overall Financial Resource Strain (CARDIA)    Difficulty of Paying Living Expenses: Somewhat hard  Food Insecurity: No Food Insecurity (11/01/2022)   Received from [facility], [facility]   Hunger Vital Sign    Worried About Running Out of Food in the Last Year: Never true    Ran Out of Food in the Last Year: Never true  Transportation Needs: No Transportation Needs (11/01/2022)   Received from [facility], [facility]   PRAPARE - Transportation    Lack of Transportation (Medical): No    Lack of Transportation (Non-Medical): No  Physical Activity: Unknown (11/01/2022)   Received from [facility], [facility]   Exercise Vital Sign    Days of Exercise per Week: 0 days    Minutes of Exercise per Session: Not on file  Stress: Stress Concern Present (11/01/2022)   Received from [facility] - Occupational Stress Questionnaire    Feeling of Stress : To some extent  Social  Connections: Socially Integrated (11/01/2022)   Received from [facility], [facility]   Social Network    How would you rate your social network (family, work, friends)?: Good participation with social networks  Intimate Partner Violence: Not At Risk (11/01/2022)   Received from [facility], [facility]   HITS    Over the last 12 months how often did your partner physically hurt you?: 1    Over the last 12 months how often did your partner insult you or talk down to you?: 1    Over the last 12 months how often did your partner threaten you with physical harm?: 1    Over the last 12 months how often did your partner scream or curse at you?: 1      PHYSICAL EXAM  Vitals:   12/07/22 1411  Weight: 293 lb 6.4 oz (133.1 kg)  Height: 5' 7.5" (1.715 m)    Body mass index is 45.27 kg/m.   Generalized: Well developed, in no acute distress  Cardiology: normal rate and rhythm, no murmur auscultated  Respiratory: clear to auscultation bilaterally    Neurological examination  Mentation: Alert oriented to time, place, history taking. Follows all commands speech and language fluent Cranial nerve II-XII: Pupils were equal round reactive to light. Extraocular movements were full, visual field were full on confrontational test. Facial sensation and strength were normal. Uvula tongue midline. Head turning and shoulder shrug  were normal and symmetric. Motor: The motor testing reveals 5 over 5 strength of all 4 extremities. Good symmetric motor tone is noted throughout.  Sensory: Sensory testing is intact to soft touch on all 4 extremities. No  evidence of extinction is noted.  Coordination: Cerebellar testing reveals good finger-nose-finger and heel-to-shin bilaterally.  Gait and station: Gait is arthritic, wide, slight left foot drop  Reflexes: Deep tendon reflexes are symmetric and normal bilaterally.     DIAGNOSTIC DATA (LABS, IMAGING, TESTING) - I reviewed patient records, labs,  notes, testing and imaging myself where available.  Lab Results  Component Value Date   WBC 10.5 11/18/2022   HGB 12.9 11/18/2022   HCT 39.8 11/18/2022   MCV 89 11/18/2022   PLT 248 11/18/2022      Component Value Date/Time   NA 140 06/01/2022 1442   K 4.3 06/01/2022 1442   CL 102 06/01/2022 1442   CO2 22 06/01/2022 1442   GLUCOSE 95 06/01/2022 1442   GLUCOSE 105 (H) 06/11/2018 2231   BUN 14 06/01/2022 1442   CREATININE 0.54 (L) 06/01/2022 1442   CALCIUM 9.2 06/01/2022 1442   PROT 7.0 06/01/2022 1442   ALBUMIN 4.4 06/01/2022 1442   AST 19 06/01/2022 1442   ALT 24 06/01/2022 1442   ALKPHOS 78 06/01/2022 1442   BILITOT 0.3 06/01/2022 1442   GFRNONAA >60 06/11/2018 2231   GFRAA >60 06/11/2018 2231   No results found for: "CHOL", "HDL", "LDLCALC", "LDLDIRECT", "TRIG", "CHOLHDL" No results found for: "HGBA1C" No results found for: "VITAMINB12" No results found for: "TSH"      No data to display           ASSESSMENT AND PLAN  37 y.o. year old female  has a past medical history of Back pain, Guillain Barr syndrome (HCC), Headache, MS (multiple sclerosis) (HCC), Pseudoseizures, and Vision abnormalities. here with   Multiple sclerosis (HCC)  High risk medication use  Chronic fatigue  Vitamin D deficiency  Paroxysmal atrial fibrillation (HCC)  Attention deficit disorder (ADD) without hyperactivity  Insomnia, unspecified type  Restless leg syndrome   No orders of the defined types were placed in this encounter.  Lonnetta is doing well from an MS standpoint.  We will continue Tysabri infusions monthly.  Labs stable 11/2022. I will assist with transferring care for infusions to [place]. She will continue methylphenidate, clonazepam, ropinirole and Emgality as prescribed.  PDMP reviewed and reveals appropriate refills. She will call for refills when she gets moved to new home. She will continue to monitor symptoms of right foot pain.  Healthy lifestyle habits  encouraged.  She will follow-up with Dr. Epimenio Foot in 6 months, sooner if needed.  She verbalizes understanding and agreement with this plan.   I spent 30 minutes of face-to-face and non-face-to-face time with patient.  This included previsit chart review, lab review, study review, order entry, electronic health record documentation, patient education.    Shawnie Dapper, MSN, FNP-C 12/07/2022, 2:14 PM  [facility] [address], [address] [place], [place] [address] [phone]

## 2022-12-04 ENCOUNTER — Other Ambulatory Visit: Payer: Self-pay | Admitting: Neurology

## 2022-12-07 ENCOUNTER — Encounter: Payer: Self-pay | Admitting: Family Medicine

## 2022-12-07 ENCOUNTER — Ambulatory Visit: Payer: Medicare Other | Admitting: Family Medicine

## 2022-12-07 VITALS — BP 116/78 | HR 83 | Ht 67.5 in | Wt 293.4 lb

## 2022-12-07 DIAGNOSIS — Z79899 Other long term (current) drug therapy: Secondary | ICD-10-CM

## 2022-12-07 DIAGNOSIS — E559 Vitamin D deficiency, unspecified: Secondary | ICD-10-CM | POA: Diagnosis not present

## 2022-12-07 DIAGNOSIS — G2581 Restless legs syndrome: Secondary | ICD-10-CM

## 2022-12-07 DIAGNOSIS — G35 Multiple sclerosis: Secondary | ICD-10-CM | POA: Diagnosis not present

## 2022-12-07 DIAGNOSIS — I480 Paroxysmal atrial fibrillation: Secondary | ICD-10-CM

## 2022-12-07 DIAGNOSIS — R5382 Chronic fatigue, unspecified: Secondary | ICD-10-CM | POA: Diagnosis not present

## 2022-12-07 DIAGNOSIS — F988 Other specified behavioral and emotional disorders with onset usually occurring in childhood and adolescence: Secondary | ICD-10-CM

## 2022-12-07 DIAGNOSIS — G4700 Insomnia, unspecified: Secondary | ICD-10-CM

## 2022-12-07 NOTE — Telephone Encounter (Signed)
Last seen 06/01/2022 Upcoming Appointment 12/07/2022  Klonopin Last Filled 10/21/2022

## 2022-12-08 ENCOUNTER — Encounter: Payer: Self-pay | Admitting: Family Medicine

## 2022-12-08 MED ORDER — VITAMIN D (ERGOCALCIFEROL) 1.25 MG (50000 UNIT) PO CAPS: 50000.0000 [IU] | ORAL_CAPSULE | ORAL | 1 refills | Status: AC

## 2022-12-09 ENCOUNTER — Telehealth: Payer: Self-pay | Admitting: *Deleted

## 2022-12-09 NOTE — Telephone Encounter (Signed)
-----   Message from Amy Lomax sent at 12/07/2022  4:21 PM EDT ----- Needs to transfer infusion care to [facility] [place]. Currently on Tysabri every month. Has next infusion with [place] next week but then moving to [facility] 12/12/2022. Previous patient with this center.

## 2022-12-18 ENCOUNTER — Other Ambulatory Visit: Payer: Self-pay

## 2022-12-18 ENCOUNTER — Ambulatory Visit (HOSPITAL_COMMUNITY): Admission: RE | Admit: 2022-12-18 | Payer: Medicare Other | Source: Ambulatory Visit

## 2022-12-18 ENCOUNTER — Encounter (HOSPITAL_COMMUNITY)
Admission: RE | Admit: 2022-12-18 | Discharge: 2022-12-18 | Disposition: A | Discharge status: Home / Self Care | Payer: Medicare Other | Source: Ambulatory Visit | Attending: Surgery | Admitting: Surgery

## 2022-12-18 ENCOUNTER — Ambulatory Visit (HOSPITAL_COMMUNITY)
Admission: RE | Admit: 2022-12-18 | Discharge: 2022-12-18 | Disposition: A | Discharge status: Home / Self Care | Payer: Medicare Other | Source: Ambulatory Visit | Attending: Surgery | Admitting: Surgery

## 2022-12-18 DIAGNOSIS — Z01818 Encounter for other preprocedural examination: Secondary | ICD-10-CM

## 2022-12-21 ENCOUNTER — Encounter: Payer: Self-pay | Admitting: Neurology

## 2022-12-21 ENCOUNTER — Other Ambulatory Visit: Payer: Self-pay

## 2022-12-21 ENCOUNTER — Other Ambulatory Visit: Payer: Self-pay | Admitting: Neurology

## 2022-12-21 MED ORDER — METHYLPHENIDATE HCL 20 MG PO TABS: 20.0000 mg | ORAL_TABLET | Freq: Two times a day (BID) | ORAL | 0 refills | Status: DC

## 2022-12-22 ENCOUNTER — Ambulatory Visit: Payer: Medicare Other | Admitting: Dietician

## 2022-12-24 ENCOUNTER — Encounter: Payer: Self-pay | Admitting: Neurology

## 2022-12-24 NOTE — Telephone Encounter (Signed)
I have reached out to [employer] for assistance in changing her Tysabri infusion site.

## 2022-12-24 NOTE — Telephone Encounter (Signed)
Dr.Sater Rx was sent to pharmacy on 12/21/22  Please deny Rx

## 2022-12-29 NOTE — Telephone Encounter (Signed)
Order faxed to [facility]. Received a receipt of confirmation.

## 2022-12-29 NOTE — Telephone Encounter (Signed)
I called [facility]. The infusion center's P: [phone] & F: [phone].

## 2023-01-07 ENCOUNTER — Ambulatory Visit: Payer: Medicare Other | Admitting: Dietician

## 2023-01-12 ENCOUNTER — Telehealth: Payer: Self-pay | Admitting: *Deleted

## 2023-01-18 ENCOUNTER — Other Ambulatory Visit: Payer: Self-pay | Admitting: Neurology

## 2023-01-19 ENCOUNTER — Other Ambulatory Visit: Payer: Self-pay | Admitting: Neurology

## 2023-01-20 MED ORDER — METHYLPHENIDATE HCL 20 MG PO TABS: 20.0000 mg | ORAL_TABLET | Freq: Two times a day (BID) | ORAL | 0 refills | Status: DC

## 2023-01-20 NOTE — Telephone Encounter (Signed)
Last seen on 12/07/22 Follow up scheduled on 06/17/23

## 2023-01-26 ENCOUNTER — Encounter: Payer: Self-pay | Admitting: Neurology

## 2023-01-26 MED ORDER — EMGALITY 120 MG/ML ~~LOC~~ SOAJ: SUBCUTANEOUS | 1 refills | Status: DC

## 2023-01-26 MED ORDER — CLONAZEPAM 1 MG PO TABS: ORAL_TABLET | ORAL | 5 refills | Status: DC

## 2023-01-26 NOTE — Telephone Encounter (Signed)
Pt last seen on 12/07/22 Follow up scheduled on 06/17/23 Klonopin last filled on 12/07/22 #30 tablets (30 day supply)   Caitlin [name] patient saw you last on 12/07/22 per note "She will continue methylphenidate, clonazepam, ropinirole and Emgality as prescribed."  Are you willing to refill her klonopin for her to new pharmacy since it controlled it can't be transferred. Rx is pending to be signed.  Please advise

## 2023-01-28 ENCOUNTER — Ambulatory Visit: Payer: Medicare Other | Admitting: Dietician

## 2023-02-18 ENCOUNTER — Other Ambulatory Visit: Payer: Self-pay | Admitting: Neurology

## 2023-02-18 MED ORDER — METHYLPHENIDATE HCL 20 MG PO TABS: 20.0000 mg | ORAL_TABLET | Freq: Two times a day (BID) | ORAL | 0 refills | Status: DC

## 2023-02-18 NOTE — Telephone Encounter (Signed)
Last seen on 12/07/22 Follow up scheduled on 06/17/23 Last filed on 01/20/23 #60 tablets (30 day supply) Rx pending to be signed

## 2023-03-22 ENCOUNTER — Other Ambulatory Visit: Payer: Self-pay | Admitting: Neurology

## 2023-03-22 MED ORDER — METHYLPHENIDATE HCL 20 MG PO TABS: 20.0000 mg | ORAL_TABLET | Freq: Two times a day (BID) | ORAL | 0 refills | Status: DC

## 2023-03-22 NOTE — Telephone Encounter (Signed)
Last seen on 12/07/22 Follow up scheduled on 06/17/23 Last filled on 02/18/23 #60 tablets (30 day supply) Rx pending to be signed

## 2023-04-21 ENCOUNTER — Other Ambulatory Visit: Payer: Self-pay | Admitting: Neurology

## 2023-04-23 ENCOUNTER — Encounter: Payer: Self-pay | Admitting: Neurology

## 2023-04-26 ENCOUNTER — Other Ambulatory Visit: Payer: Self-pay

## 2023-04-26 MED ORDER — METHYLPHENIDATE HCL 20 MG PO TABS: 20.0000 mg | ORAL_TABLET | Freq: Two times a day (BID) | ORAL | 0 refills | Status: DC

## 2023-04-26 NOTE — Telephone Encounter (Signed)
Pt last seen 12/07/2022 Upcoming Appointment 06/17/2023  Ritalin Last Filled 03/22/2023 Escript 04/26/2023

## 2023-04-29 MED ORDER — METHYLPHENIDATE HCL 20 MG PO TABS: 20.0000 mg | ORAL_TABLET | Freq: Two times a day (BID) | ORAL | 0 refills | Status: DC

## 2023-04-29 NOTE — Telephone Encounter (Signed)
Last seen on 12/07/22 Follow up scheduled 06/17/23 Last filled 03/22/23 #60 tablets (30 day supply) Rx pending to be signed

## 2023-06-17 ENCOUNTER — Ambulatory Visit: Payer: Medicare Other | Admitting: Neurology

## 2023-06-17 ENCOUNTER — Encounter: Payer: Self-pay | Admitting: Neurology

## 2023-06-17 VITALS — BP 125/72 | HR 69 | Ht 68.0 in | Wt 299.5 lb

## 2023-06-17 DIAGNOSIS — G35 Multiple sclerosis: Secondary | ICD-10-CM | POA: Diagnosis not present

## 2023-06-17 DIAGNOSIS — F988 Other specified behavioral and emotional disorders with onset usually occurring in childhood and adolescence: Secondary | ICD-10-CM | POA: Diagnosis not present

## 2023-06-17 DIAGNOSIS — Z79899 Other long term (current) drug therapy: Secondary | ICD-10-CM

## 2023-06-17 DIAGNOSIS — I480 Paroxysmal atrial fibrillation: Secondary | ICD-10-CM

## 2023-06-17 DIAGNOSIS — R5382 Chronic fatigue, unspecified: Secondary | ICD-10-CM | POA: Diagnosis not present

## 2023-06-17 DIAGNOSIS — G2581 Restless legs syndrome: Secondary | ICD-10-CM

## 2023-06-17 DIAGNOSIS — G35D Multiple sclerosis, unspecified: Secondary | ICD-10-CM

## 2023-06-17 DIAGNOSIS — G4733 Obstructive sleep apnea (adult) (pediatric): Secondary | ICD-10-CM

## 2023-06-17 MED ORDER — AMPHETAMINE-DEXTROAMPHETAMINE 20 MG PO TABS: 20.0000 mg | ORAL_TABLET | Freq: Two times a day (BID) | ORAL | 0 refills | Status: DC

## 2023-06-17 MED ORDER — ROPINIROLE HCL 1 MG PO TABS: ORAL_TABLET | ORAL | 3 refills | Status: DC

## 2023-06-17 NOTE — Progress Notes (Signed)
 [facility]  PATIENT: Caitlin [name] DOB: December 03, 1985  REFERRING CLINICIAN: Midge Rush  HISTORY FROM: patient  REASON FOR VISIT: MS   HISTORICAL  CHIEF COMPLAINT:  Chief Complaint  Patient presents with   Room 10    Pt is here with her Son. Pt states that her restless legs bother her more so at night time and she feels that her Requip  0.5mg  isn't helping with her legs. Pt states that she would like to have adderall.     HISTORY OF PRESENT ILLNESS:  Caitlin [name] is a 38 y.o. 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      Update 06/17/2023 Her next Tysabri  infusion is next week (does with [facility]).    Her last JCV antibody was checked 05/12/2022 and was negative at 0.12   She does infusions near [place].  Gait and balance are stable.   She has no falls.  She uses the bannister and does one step at a time.She denies any change in strength or gait.  She gets occasional left leg burning/tingling dysesthesias..   No falls.   She notes no new visual symptoms.  She continues to have some sequela of the optic neuritis on the right with desaturated colors.    Bladder is stable  with som urgency.    She has fatigue.   Adderall has helped more than ritalin  and she would like to get back on.    She has RLS helped by clonazepam  a bit.   Ropinirole  seemed to help more initially.  .     She has some days when she notes some word finding issues, esp if tired, but overall cognition is doing well.     Migraines are better on Emgality  but she currently has one. Frequency is much better now.   She takes NSAIDs, often with benefit if one occurs.    Triptans had not helped.  She has not tried an oral ani-CGRP agent.    Laying in a dark room helped also.    She does best if she falls asleep as they are better upon awakening.     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   She had ablation done recently and is in NSR.  She will br seeing Dr. Lilian.  She was started on CPAP  ([facility])  MS History:   She was diagnosed with MS in 2016 after presenting with optic neuritis.  A couple years earlier, she had had possible transverse myelitis with leg weakness.  She was started on Tysabri  as a DMT March 2016.    Despite a lot of symptoms, she has a very low plaque burden on the brain MRI and no lesions have been seen in her spine.   MS was diagnosed after an episode of optic neuritis and having a couple small non-specific foci on brain MRI.    A couple years earlier she had a possible transverse myelitis (vs mild GBS).        MRI brain from 2012 showed one small enhancing lesion at that time (read as nornal but reviewed at 2016 visit).  MRI in 2016 showed 4 foci, 3 not present in 2012.      IMAGING: MRI of the cervical spine 03/02/2016 was essentially normal. There was no evidence of cervical myelopathy and just disc bulge at 2 levels.  The MRI of the brain 06/01/2016 is unchanged compared to the MRI of the brain 01/31/2016 and showed no acute findings.  MRI of the brain 01/07/2019 showed several small T2/flair hyperintense foci in the left hemisphere and right cerebellum.  Though nonspecific, these could be consistent with chronic demyelinating plaque associated with multiple sclerosis.  None of these appear to be acute and they were all present on previous MRIs.     No acute findings.   REVIEW OF SYSTEMS:  Constitutional: No fevers, chills, sweats, or change in appetite.   She has fatigue and insomnia Eyes: as above Ear, nose and throat: No hearing loss, ear pain, nasal congestion, sore throat Cardiovascular: No chest pain, palpitations Respiratory:  No shortness of breath at rest or with exertion.   No wheezes GastrointestinaI: No nausea, vomiting, diarrhea, abdominal pain, fecal incontinence Genitourinary:  She reports hesitancy.     She notes nocturia. Musculoskeletal:  No neck pain, some back pain Integumentary: No rash, pruritus, skin lesions Neurological: as  above Psychiatric: Some depression at this time.  Some anxiety Endocrine: No palpitations, diaphoresis, change in appetite, change in weigh or increased thirst  ALLERGIES: Allergies  Allergen Reactions   Ciprofloxacin Anaphylaxis   Influenza Vaccines Other (See Comments)    Hx of Guillain Barre    HOME MEDICATIONS: Outpatient Medications Prior to Visit  Medication Sig Dispense Refill   cholecalciferol (VITAMIN D3) 25 MCG (1000 UNIT) tablet Take 1,000 Units by mouth daily.     clonazePAM  (KLONOPIN ) 1 MG tablet TAKE 1/2 TO 1 TABLET BY MOUTH AT BEDTIME 30 tablet 5   Galcanezumab -gnlm (EMGALITY ) 120 MG/ML SOAJ INJECT 1 SYRINGE UNDER THE SKIN ONCE A MONTH 3 mL 1   natalizumab  (TYSABRI ) 300 MG/15ML injection Inject into the vein.     methylphenidate  (RITALIN ) 20 MG tablet Take 1 tablet (20 mg total) by mouth 2 (two) times daily. 60 tablet 0   methylphenidate  (RITALIN ) 20 MG tablet Take 1 tablet (20 mg total) by mouth 2 (two) times daily. 60 tablet 0   rOPINIRole  (REQUIP ) 0.5 MG tablet TAKE 1 TO 2 TABLETS BY MOUTH ONCE DAILY AS NEEDED FOR RLS 180 tablet 1   flecainide (TAMBOCOR) 100 MG tablet Take 100 mg by mouth daily as needed.     Vitamin D , Ergocalciferol , (DRISDOL ) 1.25 MG (50000 UNIT) CAPS capsule Take 1 capsule (50,000 Units total) by mouth every 7 (seven) days. (Patient not taking: Reported on 06/17/2023) 13 capsule 1   No facility-administered medications prior to visit.    PAST MEDICAL HISTORY: Past Medical History:  Diagnosis Date   Back pain    Guillain Barr syndrome (HCC)    Headache    MS (multiple sclerosis) (HCC)    Pseudoseizures    Vision abnormalities     PAST SURGICAL HISTORY: Past Surgical History:  Procedure Laterality Date   BACK SURGERY     CESAREAN SECTION  2012   CHOLECYSTECTOMY, LAPAROSCOPIC  2004   KNEE SURGERY     LUMBAR LAMINECTOMY     VAGINAL HYSTERECTOMY      FAMILY HISTORY: Family History  Problem Relation Age of Onset   Diabetes type II  Mother    Hypertension Mother    Fibromyalgia Mother    Cardiomyopathy Father    Heart attack Father     SOCIAL HISTORY:  Social History   Socioeconomic History   Marital status: Married    Spouse name: Not on file   Number of children: Not on file   Years of education: Not on file   Highest education level: Not on file  Occupational History   Not on file  Tobacco Use   Smoking status: Former    Current packs/day: 1.00    Types: Cigarettes   Smokeless tobacco: Never  Vaping Use   Vaping status: Every Day   Substances: Nicotine, Flavoring  Substance and Sexual Activity   Alcohol use: No    Alcohol/week: 0.0 standard drinks of alcohol   Drug use: No   Sexual activity: Not on file  Other Topics Concern   Not on file  Social History Narrative   Not on file   Social Drivers of Health   Financial Resource Strain: Medium Risk (11/01/2022)   Received from [facility], [facility]   Overall Financial Resource Strain (CARDIA)    Difficulty of Paying Living Expenses: Somewhat hard  Food Insecurity: No Food Insecurity (11/01/2022)   Received from [facility], [facility]   Hunger Vital Sign    Worried About Running Out of Food in the Last Year: Never true    Ran Out of Food in the Last Year: Never true  Transportation Needs: No Transportation Needs (11/01/2022)   Received from [facility], [facility]   PRAPARE - Transportation    Lack of Transportation (Medical): No    Lack of Transportation (Non-Medical): No  Physical Activity: Unknown (11/01/2022)   Received from [facility], [facility]   Exercise Vital Sign    Days of Exercise per Week: 0 days    Minutes of Exercise per Session: Not on file  Stress: Stress Concern Present (11/01/2022)   Received from [facility], [facility] - Occupational Stress Questionnaire    Feeling of Stress : To some extent  Social Connections: Socially Integrated (11/01/2022)    Received from [facility], [facility]   Social Network    How would you rate your social network (family, work, friends)?: Good participation with social networks  Intimate Partner Violence: Not At Risk (11/01/2022)   Received from [facility], [facility]   HITS    Over the last 12 months how often did your partner physically hurt you?: Never    Over the last 12 months how often did your partner insult you or talk down to you?: Never    Over the last 12 months how often did your partner threaten you with physical harm?: Never    Over the last 12 months how often did your partner scream or curse at you?: Never     PHYSICAL EXAM  Vitals:   06/17/23 1410  BP: 125/72  Pulse: 69  Weight: 299 lb 8 oz (135.9 kg)  Height: 5' 8 (1.727 m)     Body mass index is 45.54 kg/m.   General: The patient is well-developed and well-nourished and in mild distress from chronic migraine.  Neck range of motion is normal.  Neurologic Exam  Mental status: The patient is alert and oriented x 3 at the time of the examination.  Normal memory and attention.     Cranial nerves: EOM intact.  There is reduced color vision OD     Speech is normal.    Normal facial strength and slightly decreased left sensation.   SCM/trapezius are strong.   Hearing is symmetric         Motor:  Muscle bulk and tone are normal. Strength is  5 / 5 in the arms.  Strength was 5/5 except for 4+/5 left toe, foot and ankle extension.  Coordination:   Finger-nose-finger is normal bilaterally.  Left Heel to shin is  mildly reduced.  .   Sensory: On sensory testing she reports decreased sensation to vibration  in the left leg.  Sensation is symmetric in arms       Gait and station: Station is normal.  The gait is normal.  Tandem gait is mildly wide.  Romberg is negative.  Reflexes: DTRs were mildly asymmetric, increased on her left leg.        ASSESSMENT AND PLAN 1. Multiple sclerosis (HCC)   2. High risk medication  use   3. 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 Paroxysmal atrial fibrillation (HCC)   6. Restless leg syndrome   7. OSA on CPAP       1.   Continue Tysabri .  Check CBC and JCV Ab today.  She will need to get reestablished at an [place] 2.   For migraines, she will continue  Emgality  for chronic migraines .   NSAID for breakthrough 3.   Continue Adderall for EDS/fatigue, and clonazepam  for sleep/anxiety   Continue CPAP 4.   Ropinirole  1 mg po qPM and qHS for RLS. Stay active and exercise as tolerated Return in 6 months or sooner if there are new or worsening neurologic symptoms.   This visit is part of a comprehensive longitudinal care medical relationship regarding the patients primary diagnosis of MS and related concerns.     Braxden Lovering A. Vear, MD, PhD 06/17/2023, 2:36 PM Certified in Neurology, Clinical Neurophysiology, Sleep Medicine, Pain Medicine and Neuroimaging  [facility] [address] [place], [place] [address] [phone]

## 2023-06-28 ENCOUNTER — Telehealth: Payer: Self-pay | Admitting: *Deleted

## 2023-06-28 NOTE — Telephone Encounter (Signed)
Received Tysabri re-auth form to complete. Called pt. Confirmed she completed JCV 05/13/23 negative, index: 0.12. Per MD, no CBC needed since JCV completed. I relayed to pt.  Faxed form back to [employer] at [phone]. Received fax confirmation.

## 2023-07-14 ENCOUNTER — Other Ambulatory Visit: Payer: Self-pay | Admitting: Neurology

## 2023-07-14 MED ORDER — AMPHETAMINE-DEXTROAMPHETAMINE 20 MG PO TABS: 20.0000 mg | ORAL_TABLET | Freq: Two times a day (BID) | ORAL | 0 refills | Status: DC

## 2023-07-14 NOTE — Telephone Encounter (Signed)
 Last seen 06/17/23 and next f/u 01/05/24. Last refilled 06/16/50 #60.

## 2023-07-21 ENCOUNTER — Encounter: Payer: Self-pay | Admitting: Neurology

## 2023-07-21 ENCOUNTER — Other Ambulatory Visit: Payer: Self-pay | Admitting: Neurology

## 2023-07-21 MED ORDER — METHYLPHENIDATE HCL 20 MG PO TABS: 20.0000 mg | ORAL_TABLET | Freq: Two times a day (BID) | ORAL | 0 refills | Status: DC

## 2023-07-29 ENCOUNTER — Other Ambulatory Visit: Payer: Self-pay

## 2023-07-29 MED ORDER — CLONAZEPAM 1 MG PO TABS: ORAL_TABLET | ORAL | 5 refills | Status: DC

## 2023-07-29 NOTE — Telephone Encounter (Signed)
 Last seen 06/17/23, next appt 01/05/24 Dispenses   Dispensed Days Supply Quantity Provider Pharmacy  CLONAZEPAM 1 MG TABLET 06/22/2023 30 30 each Lomax, Amy, NP [id number]  CLONAZEPAM 1 MG TABLET 05/22/2023 30 30 each Lomax, Amy, NP [id number]  CLONAZEPAM 1 MG TABLET 04/03/2023 30 30 each Lomax, Amy, NP [id number]  CLONAZEPAM 1 MG TABLET 03/05/2023 30 30 each Lomax, Amy, NP [id number]  CLONAZEPAM 1 MG TABLET 01/26/2023 30 30 each Lomax, Amy, NP [id number]  CLONAZEPAM 1 MG TABLET 12/07/2022 30 30 each Sater, Pearletha Furl, MD [facility] [phone] - T...  CLONAZEPAM 1 MG TABLET 10/21/2022 30 30 each Sater, Pearletha Furl, MD [facility] [phone] - T...  CLONAZEPAM 1 MG TABLET 09/21/2022 30 30 each Sater, Pearletha Furl, MD [facility] [phone] - T...  CLONAZEPAM 1 MG TABLET 08/17/2022 30 30 each Sater, Pearletha Furl, MD [facility] [phone] - T.Marland KitchenMarland Kitchen

## 2023-08-11 ENCOUNTER — Telehealth: Payer: Self-pay

## 2023-08-11 NOTE — Transitions of Care (Post Inpatient/ED Visit) (Signed)
   08/11/2023  Name: Caitlin [name] MRN: [id number] DOB: 27-Nov-1985  Today's TOC FU Call Status: Today's TOC FU Call Status:: Successful TOC FU Call Completed TOC FU Call Complete Date: 08/11/23 Patient's Name and Date of Birth confirmed.  Transition Care Management Follow-up Telephone Call Date of Discharge: 08/09/23 Discharge Facility: Other [facility]) Name of Other (Non[facility]) Discharge Facility: [facility] Type of Discharge: Emergency Department Reason for ED Visit: Cardiac Conditions (palpitations) Cardiac Conditions Diagnosis:  (tachy) How have you been since you were released from the hospital?: Better Any questions or concerns?: No  Items Reviewed: Did you receive and understand the discharge instructions provided?: Yes Medications obtained,verified, and reconciled?: Yes (Medications Reviewed) Any new allergies since your discharge?: No Dietary orders reviewed?: NA Do you have support at home?: No  Medications Reviewed Today: Medications Reviewed Today     Reviewed by Leroy Kennedy, CMA (Certified Medical Assistant) on 08/11/23 at [phone]  Med List Status: <None>   Medication Order Taking? Sig Documenting Provider Last Dose Status Informant  cholecalciferol (VITAMIN D3) 25 MCG (1000 UNIT) tablet [id number] Yes Take 1,000 Units by mouth daily. [provider] Taking Active   clonazePAM (KLONOPIN) 1 MG tablet [id number] Yes TAKE 1/2 TO 1 TABLET BY MOUTH AT BEDTIME Lomax, Amy, NP Taking Active   flecainide (TAMBOCOR) 100 MG tablet [id number]  Take 100 mg by mouth daily as needed. [provider]  Expired 06/14/21 2359   Galcanezumab-gnlm (EMGALITY) 120 MG/ML Ivory Broad [id number] Yes INJECT 1 SYRINGE UNDER THE SKIN ONCE A MONTH Lomax, Amy, NP Taking Active   methylphenidate (RITALIN) 20 MG tablet [id number] Yes Take 1 tablet (20 mg total) by mouth 2 (two) times daily. Sater, Pearletha Furl, MD Taking Active   natalizumab (TYSABRI) 300 MG/15ML injection [id number] Yes  Inject into the vein. [provider] Taking Active            Med Note Brooke Dare, EMMA L   Mon May 08, 2019 11:43 AM) JCV ab collected on 04/24/19 negative, index: 0.18  rOPINIRole (REQUIP) 1 MG tablet [id number] Yes Take 1 to 2 Tablets by mouth once Daily as Needed for RLS Sater, Pearletha Furl, MD Taking Active   Vitamin D, Ergocalciferol, (DRISDOL) 1.25 MG (50000 UNIT) CAPS capsule [id number] Yes Take 1 capsule (50,000 Units total) by mouth every 7 (seven) days. Lomax, Amy, NP Taking Active             Home Care and Equipment/Supplies: Were Home Health Services Ordered?: NA Any new equipment or medical supplies ordered?: NA  Functional Questionnaire: Do you need assistance with bathing/showering or dressing?: No Do you need assistance with meal preparation?: No Do you need assistance with eating?: No Do you have difficulty maintaining continence: No Do you need assistance with getting out of bed/getting out of a chair/moving?: No Do you have difficulty managing or taking your medications?: No  Follow up appointments reviewed: PCP Follow-up appointment confirmed?: NA (Pt declined at this this due to relocation) Specialist Hospital Follow-up appointment confirmed?: NA (cardiology) Do you need transportation to your follow-up appointment?: No Do you understand care options if your condition(s) worsen?: Yes-patient verbalized understanding    SIGNATURE Jodelle Green, RMA

## 2023-08-16 ENCOUNTER — Encounter: Payer: Self-pay | Admitting: Neurology

## 2023-08-16 MED ORDER — AMPHETAMINE-DEXTROAMPHETAMINE 20 MG PO TABS: 20.0000 mg | ORAL_TABLET | Freq: Two times a day (BID) | ORAL | 0 refills | Status: DC

## 2023-09-06 ENCOUNTER — Other Ambulatory Visit: Payer: Self-pay | Admitting: *Deleted

## 2023-09-06 MED ORDER — EMGALITY 120 MG/ML ~~LOC~~ SOAJ: SUBCUTANEOUS | 1 refills | Status: DC

## 2023-09-15 ENCOUNTER — Other Ambulatory Visit: Payer: Self-pay | Admitting: Neurology

## 2023-09-15 MED ORDER — AMPHETAMINE-DEXTROAMPHETAMINE 20 MG PO TABS: 20.0000 mg | ORAL_TABLET | Freq: Two times a day (BID) | ORAL | 0 refills | Status: DC

## 2023-09-15 NOTE — Telephone Encounter (Signed)
 Last seen on 06/17/23 Follow up scheduled 01/05/24   Dispensed Days Supply Quantity Provider Pharmacy  DEXTROAMP-AMPHETAMIN 20 MG TAB 08/18/2023 30 60 each Sater, Sherida Dimmer, MD [phone]...      Rx pending to be signed

## 2023-10-12 ENCOUNTER — Encounter: Payer: Self-pay | Admitting: Neurology

## 2023-10-12 MED ORDER — AMPHETAMINE-DEXTROAMPHETAMINE 20 MG PO TABS: 20.0000 mg | ORAL_TABLET | Freq: Two times a day (BID) | ORAL | 0 refills | Status: DC

## 2023-10-12 NOTE — Telephone Encounter (Signed)
 Last seen on 06/17/23 Follow up scheduled on 01/05/24   Dispensed Days Supply Quantity Provider Pharmacy  DEXTROAMP-AMPHETAMIN 20 MG TAB 09/15/2023 30 60 each Sater, Sherida Dimmer, MD [facility] [phone] - C...     Rx pending to be signed

## 2023-11-09 ENCOUNTER — Encounter: Payer: Self-pay | Admitting: Neurology

## 2023-11-10 MED ORDER — AMPHETAMINE-DEXTROAMPHETAMINE 20 MG PO TABS: 20.0000 mg | ORAL_TABLET | Freq: Two times a day (BID) | ORAL | 0 refills | Status: DC

## 2023-11-10 NOTE — Telephone Encounter (Signed)
 Last seen on 06/17/23 Follow up scheduled on 01/05/24   Dispensed Days Supply Quantity Provider Pharmacy  DEXTROAMP-AMPHETAMIN 20 MG TAB 10/14/2023 30 60 each Sater, Charlie LABOR, MD [facility] [phone] - C...      Rx pending to be signed

## 2023-12-07 ENCOUNTER — Encounter: Payer: Self-pay | Admitting: Neurology

## 2023-12-08 ENCOUNTER — Telehealth: Payer: Self-pay

## 2023-12-08 NOTE — Telephone Encounter (Signed)
 Last Seen 06/17/2023 Upcoming 01/05/2024

## 2023-12-13 ENCOUNTER — Other Ambulatory Visit: Payer: Self-pay

## 2023-12-13 MED ORDER — AMPHETAMINE-DEXTROAMPHETAMINE 20 MG PO TABS: 20.0000 mg | ORAL_TABLET | Freq: Two times a day (BID) | ORAL | 0 refills | Status: DC

## 2023-12-13 NOTE — Telephone Encounter (Signed)
 Pt Last seen 06/17/2023 Upcoming Appointment 01/05/2024  Adderall Last filled 11/10/2023

## 2023-12-14 ENCOUNTER — Encounter: Payer: Self-pay | Admitting: Neurology

## 2023-12-14 NOTE — Telephone Encounter (Signed)
 Faxed signed order to fax# below. Received fax confirmation.

## 2023-12-14 NOTE — Telephone Encounter (Signed)
 Called [employer] at [phone]. Pt ID: [id number]. Ref# for call: [id number]. Spoke w/ automated system. Confirmed pt has active plan effective 05/12/2023. States PA may be needed for Dx: G35, Tysabri  Jcode: [id number]. Asked to speak with advocate to confirm this. Spoke w/ Sweet L. Auth not required and able to do buy/bill. Ref# [id number].   I called infusion at [phone]/pharmacy . Spoke w/ Tammy. Relayed above info. Orders expiring next month and they just need updated order for pt to continue infusions. Can fax to: [phone]/central scheduling. Order form filled out and pending MD signature.   Infusion site info: [facility] [place]  [address], [address]  [place], [place] [address]  Phone: [phone]

## 2024-01-05 ENCOUNTER — Telehealth: Payer: Self-pay

## 2024-01-05 ENCOUNTER — Encounter: Payer: Self-pay | Admitting: Neurology

## 2024-01-05 ENCOUNTER — Ambulatory Visit (INDEPENDENT_AMBULATORY_CARE_PROVIDER_SITE_OTHER): Payer: Medicare Other | Admitting: Neurology

## 2024-01-05 VITALS — BP 125/84 | HR 88 | Ht 68.0 in | Wt 249.5 lb

## 2024-01-05 DIAGNOSIS — G4733 Obstructive sleep apnea (adult) (pediatric): Secondary | ICD-10-CM

## 2024-01-05 DIAGNOSIS — G35 Multiple sclerosis: Secondary | ICD-10-CM | POA: Diagnosis not present

## 2024-01-05 DIAGNOSIS — R5382 Chronic fatigue, unspecified: Secondary | ICD-10-CM | POA: Diagnosis not present

## 2024-01-05 DIAGNOSIS — G43709 Chronic migraine without aura, not intractable, without status migrainosus: Secondary | ICD-10-CM

## 2024-01-05 DIAGNOSIS — Z79899 Other long term (current) drug therapy: Secondary | ICD-10-CM

## 2024-01-05 DIAGNOSIS — I480 Paroxysmal atrial fibrillation: Secondary | ICD-10-CM | POA: Diagnosis not present

## 2024-01-05 NOTE — Telephone Encounter (Signed)
 Placed JCV in [facility] box 01/05/2024

## 2024-01-05 NOTE — Progress Notes (Signed)
 [facility]  PATIENT: Caitlin [name] DOB: [birth year]  REFERRING CLINICIAN: Midge [name]  HISTORY FROM: patient  REASON FOR VISIT: MS   HISTORICAL  CHIEF COMPLAINT:  Chief Complaint  Patient presents with   Follow-up    Pt in room 10. Alone.Here for MS follow up. DMT: Tysabri  Last infusion date: 01/03/24. Patient reports MS in stable, no new concerns. Pt has heart ablation last Tuesday. Need JCV blood work today was not done at infusion. No falls, last eye exam was about 2 years ago.     HISTORY OF PRESENT ILLNESS:  Caitlin [name] is a 38 y.o. 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      Update 01/05/2024 She does monthly Tysabri  at [facility] [employer]).    Her last JCV antibody was checked 05/12/2022 and was negative at 0.12   She does infusions near [place].  Gait is ok and has mild reduced balance..   She has no falls.  She uses the bannister  on stairs and one step at a time due to her imbalance.   She denies any change in strength or gait.  She gets occasional left leg burning/tingling dysesthesias.. She notes no new visual symptoms but has continued OD reduced color saturation.  .  Bladder is stable with som urgency and retention/ double voids.  Tamsulosin  had not helped.  She has fatigue.   Adderall has helped more than ritalin  and she would like to get back on.    She has RLS helped by clonazepam  a bit.   Ropinirole  seemed to help more initially.  .     She has some days when she notes some word finding issues, esp if tired, but overall cognition is doing well.     She had a heart ablation for Afib.   She states cardiology was fine with her on Adderall.   She was on Eliquis and will be on x 3 months.   She has a HA.  She is off flecainide.   She has been told she may need a pacemaker,      Since starting Eliquis has had HA.  Migraines are better on Emgality . Frequency is much better now.   Tylenol  has not helped (NSAIDs did but can't take due to  Eliquis).     Triptans had not helped.  She has not tried an oral ani-CGRP agent.    Laying in a dark room helped also.    She does best if she falls asleep as they are better upon awakening.     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   She had ablation done recently and is in NSR.  She will br seeing Dr. Lilian.  She was started on CPAP ([facility])  She sleeps  She did not get coverage for [facility].    However has lost some weight with diet.      MS History:   She was diagnosed with MS in 2016 after presenting with optic neuritis.  A couple years earlier, she had had possible transverse myelitis with leg weakness.  She was started on Tysabri  as a DMT March 2016.    Despite a lot of symptoms, she has a very low plaque burden on the brain MRI and no lesions have been seen in her spine.   MS was diagnosed after an episode of optic neuritis and having a couple small non-specific foci on brain MRI.    A couple years earlier she had a  possible transverse myelitis (vs mild GBS).        MRI brain from 2012 showed one small enhancing lesion at that time (read as nornal but reviewed at 2016 visit).  MRI in 2016 showed 4 foci, 3 not present in 2012.      IMAGING: MRI of the cervical spine 03/02/2016 was essentially normal. There was no evidence of cervical myelopathy and just disc bulge at 2 levels.  The MRI of the brain 06/01/2016 is unchanged compared to the MRI of the brain 01/31/2016 and showed no acute findings.      MRI of the brain 01/07/2019 showed several small T2/flair hyperintense foci in the left hemisphere and right cerebellum.  Though nonspecific, these could be consistent with chronic demyelinating plaque associated with multiple sclerosis.  None of these appear to be acute and they were all present on previous MRIs.     No acute findings.   REVIEW OF SYSTEMS:  Constitutional: No fevers, chills, sweats, or change in appetite.   She has fatigue and insomnia Eyes: as above Ear, nose and  throat: No hearing loss, ear pain, nasal congestion, sore throat Cardiovascular: No chest pain, palpitations Respiratory:  No shortness of breath at rest or with exertion.   No wheezes GastrointestinaI: No nausea, vomiting, diarrhea, abdominal pain, fecal incontinence Genitourinary:  She reports hesitancy.     She notes nocturia. Musculoskeletal:  No neck pain, some back pain Integumentary: No rash, pruritus, skin lesions Neurological: as above Psychiatric: Some depression at this time.  Some anxiety Endocrine: No palpitations, diaphoresis, change in appetite, change in weigh or increased thirst  ALLERGIES: Allergies  Allergen Reactions   Ciprofloxacin Anaphylaxis   Influenza Vaccines Other (See Comments)    Hx of Guillain Barre    HOME MEDICATIONS: Outpatient Medications Prior to Visit  Medication Sig Dispense Refill   amphetamine -dextroamphetamine  (ADDERALL) 20 MG tablet Take 1 tablet (20 mg total) by mouth 2 (two) times daily. 60 tablet 0   apixaban (ELIQUIS) 5 MG TABS tablet Take 5 mg by mouth 2 (two) times daily.     cholecalciferol (VITAMIN D3) 25 MCG (1000 UNIT) tablet Take 1,000 Units by mouth daily.     clonazePAM  (KLONOPIN ) 1 MG tablet TAKE 1/2 TO 1 TABLET BY MOUTH AT BEDTIME 30 tablet 5   Galcanezumab -gnlm (EMGALITY ) 120 MG/ML SOAJ INJECT 1 SYRINGE UNDER THE SKIN ONCE A MONTH 3 mL 1   natalizumab  (TYSABRI ) 300 MG/15ML injection Inject into the vein.     rOPINIRole  (REQUIP ) 1 MG tablet Take 1 to 2 Tablets by mouth once Daily as Needed for RLS 180 tablet 3   Vitamin D , Ergocalciferol , (DRISDOL ) 1.25 MG (50000 UNIT) CAPS capsule Take 1 capsule (50,000 Units total) by mouth every 7 (seven) days. 13 capsule 1   flecainide (TAMBOCOR) 100 MG tablet Take 100 mg by mouth daily as needed. (Patient not taking: Reported on 01/05/2024)     methylphenidate  (RITALIN ) 20 MG tablet Take 1 tablet (20 mg total) by mouth 2 (two) times daily. (Patient not taking: Reported on 01/05/2024) 60 tablet  0   No facility-administered medications prior to visit.    PAST MEDICAL HISTORY: Past Medical History:  Diagnosis Date   Back pain    Guillain Barr syndrome (HCC)    Headache    MS (multiple sclerosis) (HCC)    Pseudoseizures    Vision abnormalities     PAST SURGICAL HISTORY: Past Surgical History:  Procedure Laterality Date   BACK SURGERY  CESAREAN SECTION  2012   CHOLECYSTECTOMY, LAPAROSCOPIC  2004   KNEE SURGERY     LUMBAR LAMINECTOMY     VAGINAL HYSTERECTOMY      FAMILY HISTORY: Family History  Problem Relation Age of Onset   Diabetes type II Mother    Hypertension Mother    Fibromyalgia Mother    Cardiomyopathy Father    Heart attack Father     SOCIAL HISTORY:  Social History   Socioeconomic History   Marital status: Married    Spouse name: Not on file   Number of children: Not on file   Years of education: Not on file   Highest education level: Not on file  Occupational History   Not on file  Tobacco Use   Smoking status: Former    Current packs/day: 1.00    Types: Cigarettes   Smokeless tobacco: Never  Vaping Use   Vaping status: Never Used  Substance and Sexual Activity   Alcohol use: No    Alcohol/week: 0.0 standard drinks of alcohol   Drug use: No   Sexual activity: Not on file  Other Topics Concern   Not on file  Social History Narrative   Not on file   Social Drivers of Health   Financial Resource Strain: Low Risk  (12/29/2023)   Received from [facility]   Overall Financial Resource Strain (CARDIA)    Difficulty of Paying Living Expenses: Not hard at all  Food Insecurity: No Food Insecurity (12/29/2023)   [facility]   Hunger Vital Sign    Within the past 12 months, you worried that your food would run out before you got the money to buy more.: Never true    Within the past 12 months, the food you bought just didn't last and you didn't have money to get more.: Never true  Transportation Needs: No  Transportation Needs (12/29/2023)   Received from [facility] - Transportation    Lack of Transportation (Medical): No    Lack of Transportation (Non-Medical): No  Physical Activity: Insufficiently Active (12/29/2023)   Received from [facility]   Exercise Vital Sign    On average, how many days per week do you engage in moderate to strenuous exercise (like a brisk walk)?: 3 days    On average, how many minutes do you engage in exercise at this level?: 30 min  Stress: No Stress Concern Present (12/29/2023)   Received from [facility] - Occupational Stress Questionnaire    Feeling of Stress : Only a little  Recent Concern: Stress - Stress Concern Present (12/14/2023)   Received from [facility] - Occupational Stress Questionnaire    Do you feel stress - tense, restless, nervous, or anxious, or unable to sleep at night because your mind is troubled all the time - these days?: To some extent  Social Connections: Moderately Integrated (12/29/2023)   Received from [facility]   Social Connection and Isolation Panel    In a typical week, how many times do you talk on the phone with family, friends, or neighbors?: Three times a week    How often do you get together with friends or relatives?: Three times a week    How often do you attend church or religious services?: More than 4 times per year    Do you belong to any clubs or organizations such as church groups, unions, fraternal  or athletic groups, or school groups?: No    How often do you attend meetings of the clubs or organizations you belong to?: Never    Are you married, widowed, divorced, separated, never married, or living with a partner?: Married  Intimate Partner Violence: Not At Risk (12/29/2023)   Received from [employer], Afraid, Rape, and Kick questionnaire    Within the last year, have you been afraid of your  partner or ex-partner?: No    Within the last year, have you been humiliated or emotionally abused in other ways by your partner or ex-partner?: No    Within the last year, have you been kicked, hit, slapped, or otherwise physically hurt by your partner or ex-partner?: No    Within the last year, have you been raped or forced to have any kind of sexual activity by your partner or ex-partner?: No     PHYSICAL EXAM  Vitals:   01/05/24 1042  BP: 125/84  Pulse: 88  SpO2: 100%  Weight: 249 lb 8 oz (113.2 kg)  Height: 5' 8 (1.727 m)     Body mass index is 37.94 kg/m.   General: The patient is well-developed and well-nourished and in mild distress from chronic migraine.  Neck range of motion is normal.  Neurologic Exam  Mental status: The patient is alert and oriented x 3 at the time of the examination.  Normal memory and attention.     Cranial nerves: EOM intact.  There is reduced color vision OD     Speech is normal.    Normal facial strength and slightly decreased left sensation.   SCM/trapezius are strong.   Hearing is symmetric         Motor:  Muscle bulk and tone are normal. Strength is  5 / 5 in the arms.  Strength was 5/5 except for 4+/5 left toe, foot and ankle extension.  Coordination:   Finger-nose-finger is normal bilaterally.  Left Heel to shin is mildly reduced.  .   Sensory: On sensory testing she reports decreased sensation to vibration  in the left leg.  Sensation is symmetric in arms       Gait and station: Station is normal.  The gait is normal.  Tandem gait mildly wide.  No Romberg sign.  Reflexes: DTRs were mildly asymmetric, increased on her left leg.        ASSESSMENT AND PLAN 1. Multiple sclerosis (HCC)   2. High risk medication use   3. Paroxysmal atrial fibrillation (HCC)   4. Chronic fatigue   5. OSA on CPAP   6. Chronic migraine w/o aura, not intractable, w/o stat migr      1.   Continue Tysabri .  Today we wil check Check CBC and JCV Ab  today.   For now continue infusions in [place].  2.   For migraines, she will continue  Emgality  for chronic migraines .   Tylenol  for breakthrough 3.   Continue Adderall for EDS/fatigue, and clonazepam  for sleep/anxiety   Continue CPAP 4.   Ropinirole  1 mg po qPM and qHS for RLS. 5.   Stay active and exercise as tolerated  6.   Return in 6 months or sooner if there are new or worsening neurologic symptoms.   This visit is part of a comprehensive longitudinal care medical relationship regarding the patients primary diagnosis of MS and related concerns.     Burgundy Matuszak A. Vear, MD, PhD 01/05/2024, 11:20 AM Certified in Neurology, Clinical  Neurophysiology, Sleep Medicine, Pain Medicine and Neuroimaging  [facility] [address] [place], [place] [address] [phone]

## 2024-01-06 LAB — CBC WITH DIFFERENTIAL/PLATELET
Basophils Absolute: 0 x10E3/uL (ref 0.0–0.2)
Basos: 0 %
EOS (ABSOLUTE): 0.1 x10E3/uL (ref 0.0–0.4)
Eos: 1 %
Hematocrit: 42.9 % (ref 34.0–46.6)
Hemoglobin: 14 g/dL (ref 11.1–15.9)
Immature Grans (Abs): 0 x10E3/uL (ref 0.0–0.1)
Immature Granulocytes: 0 %
Lymphocytes Absolute: 3.7 x10E3/uL — ABNORMAL HIGH (ref 0.7–3.1)
Lymphs: 37 %
MCH: 30 pg (ref 26.6–33.0)
MCHC: 32.6 g/dL (ref 31.5–35.7)
MCV: 92 fL (ref 79–97)
Monocytes Absolute: 0.5 x10E3/uL (ref 0.1–0.9)
Monocytes: 5 %
NRBC: 1 % — ABNORMAL HIGH (ref 0–0)
Neutrophils Absolute: 5.7 x10E3/uL (ref 1.4–7.0)
Neutrophils: 57 %
Platelets: 288 x10E3/uL (ref 150–450)
RBC: 4.66 x10E6/uL (ref 3.77–5.28)
RDW: 15.4 % (ref 11.7–15.4)
WBC: 10.1 x10E3/uL (ref 3.4–10.8)

## 2024-01-10 ENCOUNTER — Encounter: Payer: Self-pay | Admitting: Neurology

## 2024-01-11 MED ORDER — AMPHETAMINE-DEXTROAMPHETAMINE 20 MG PO TABS: 20.0000 mg | ORAL_TABLET | Freq: Two times a day (BID) | ORAL | 0 refills | Status: DC

## 2024-01-11 NOTE — Telephone Encounter (Signed)
 Last seen on 01/05/24 Follow up scheduled on 08/10/24   amphetamine -dextroamphetamine  (ADDERALL) 20 MG tablet 12/13/2023 -- 60 tablet 0 [id number] [place]   Take 1 tablet (20 mg total) by mouth 2 (two) times daily.    Rx pending to be signed

## 2024-01-25 ENCOUNTER — Encounter: Payer: Self-pay | Admitting: Neurology

## 2024-01-25 ENCOUNTER — Other Ambulatory Visit: Payer: Self-pay

## 2024-01-26 MED ORDER — CLONAZEPAM 1 MG PO TABS: ORAL_TABLET | ORAL | 5 refills | Status: AC

## 2024-01-26 NOTE — Telephone Encounter (Signed)
 Dr,Penumalli you are work in provider this morning.  Last seen on 01/05/24 Follow up scheduled on 08/10/24  Last filled on 12/24/23 #30 tablets   Rx pending to be signed

## 2024-01-26 NOTE — Telephone Encounter (Signed)
 Will need to [facility] to confirm last filled date.

## 2024-01-31 ENCOUNTER — Encounter: Payer: Self-pay | Admitting: Neurology

## 2024-02-08 ENCOUNTER — Encounter: Payer: Self-pay | Admitting: Neurology

## 2024-02-09 MED ORDER — AMPHETAMINE-DEXTROAMPHETAMINE 20 MG PO TABS: 20.0000 mg | ORAL_TABLET | Freq: Two times a day (BID) | ORAL | 0 refills | Status: DC

## 2024-02-09 NOTE — Telephone Encounter (Signed)
 Dr.Athar you are back up provider Last seen on 01/05/24 Follow up scheduled on 08/10/24   Dispensed Days Supply Quantity Provider Pharmacy  DEXTROAMP-AMPHETAMIN 20 MG TAB 01/11/2024 30 60 each Sater, Charlie LABOR, MD [facility] [phone] - C...   Rx pending to be signed

## 2024-03-09 ENCOUNTER — Encounter: Payer: Self-pay | Admitting: Neurology

## 2024-03-09 ENCOUNTER — Other Ambulatory Visit: Payer: Self-pay

## 2024-03-09 MED ORDER — VITAMIN D 25 MCG (1000 UNIT) PO TABS: 1000.0000 [IU] | ORAL_TABLET | Freq: Every day | ORAL | 3 refills | Status: AC

## 2024-03-10 MED ORDER — AMPHETAMINE-DEXTROAMPHETAMINE 20 MG PO TABS: 20.0000 mg | ORAL_TABLET | Freq: Two times a day (BID) | ORAL | 0 refills | Status: DC

## 2024-03-12 ENCOUNTER — Encounter: Payer: Self-pay | Admitting: Neurology

## 2024-03-13 NOTE — Telephone Encounter (Addendum)
 Called [facility] infusion at [phone]. Spoke w/ Lonell. States they have order sent 12/2023 and good until 12/2024. However, confirmed we will have to do PA for Tysbari. She did not have NPI, Tax ID or Touch ID to provide. Asked I call either of the following places:  Infusion access team: [phone]  infusion center and ask to speak with nurse: [phone].   I called: [phone] x2 and reached mailbox.  Called infusion center and spoke w/ Niels. NPI: [id number], Touch ID site #: [id number]. Did not know Tax ID. Fax: [phone]. Address: [address] [place], [place] [address]. Pt scheduled for next infusion 03/28/24 at 8:30am.

## 2024-03-14 NOTE — Telephone Encounter (Signed)
 Called [employer] at [phone]. Ref# for call [id number]. Spoke w/ Eyvonne.  Provided Jcode for Tysabri : [id number]. PA not required. Ref# for call [id number].  Total time of call: 20 min  I called infusion center and spoke w/ Janeen/nurse. She relayed they do not handle auths and asked me to call Roselie to connect with internal pharmacy at [phone]. I called and spoke w/ rep. Reports they do have a PA team to handle PA's. Once they receive updated order for infusion, this prompts them to do PA. I relayed info I got this am that no PA needed and gave ref# above. For future, they said pt can call them directly at [phone] option 2 if she has a change of insurance to prompt them to do PA.  I also provided them pt updated ID and group number. Nothing further needed

## 2024-04-09 ENCOUNTER — Encounter: Payer: Self-pay | Admitting: Neurology

## 2024-04-10 MED ORDER — AMPHETAMINE-DEXTROAMPHETAMINE 20 MG PO TABS: 20.0000 mg | ORAL_TABLET | Freq: Two times a day (BID) | ORAL | 0 refills | Status: DC

## 2024-04-10 NOTE — Telephone Encounter (Signed)
 Requested Prescriptions   Pending Prescriptions Disp Refills   amphetamine -dextroamphetamine  (ADDERALL) 20 MG tablet 60 tablet 0    Sig: Take 1 tablet (20 mg total) by mouth 2 (two) times daily.   Last seen 01/05/24 Next appt 08/11/23 Dispenses   Dispensed Days Supply Quantity Provider Pharmacy  DEXTROAMP-AMPHETAMIN 20 MG TAB 03/10/2024 30 60 each Sater, Charlie LABOR, MD [facility] [phone] - C...  DEXTROAMP-AMPHETAMIN 20 MG TAB 02/09/2024 30 60 each Athar, Saima, MD [facility] [phone] - C...  DEXTROAMP-AMPHETAMIN 20 MG TAB 01/11/2024 30 60 each Sater, Charlie LABOR, MD [facility] [phone] - C...  DEXTROAMP-AMPHETAMIN 20 MG TAB 12/13/2023 30 60 each Sater, Charlie LABOR, MD [facility] [phone] - C...  DEXTROAMP-AMPHETAMIN 20 MG TAB 11/10/2023 30 60 each Sater, Charlie LABOR, MD [facility] [phone] - C...  DEXTROAMP-AMPHETAMIN 20 MG TAB 10/14/2023 30 60 each Sater, Charlie LABOR, MD [facility] [phone] - C...  DEXTROAMP-AMPHETAMIN 20 MG TAB 09/15/2023 30 60 each Sater, Charlie LABOR, MD [facility] [phone] - C...  DEXTROAMP-AMPHETAMIN 20 MG TAB 08/18/2023 30 60 each Sater, Charlie LABOR, MD [facility] [phone] - C...  DEXTROAMP-AMPHETAMIN 20 MG TAB 07/22/2023 30 60 each Sater, Charlie LABOR, MD [facility] [phone] - C...  DEXTROAMP-AMPHETAMIN 20 MG TAB 06/17/2023 30 60 each Sater, Charlie LABOR, MD [facility] [phone] - C.SABRASABRA

## 2024-04-19 ENCOUNTER — Other Ambulatory Visit: Payer: Self-pay | Admitting: Neurology

## 2024-04-19 NOTE — Telephone Encounter (Signed)
 Last seen on 01/05/24 Follow up scheduled on 08/10/24

## 2024-05-09 ENCOUNTER — Encounter: Payer: Self-pay | Admitting: Neurology

## 2024-05-10 ENCOUNTER — Telehealth: Payer: Self-pay | Admitting: *Deleted

## 2024-05-10 MED ORDER — AMPHETAMINE-DEXTROAMPHETAMINE 20 MG PO TABS: 20.0000 mg | ORAL_TABLET | Freq: Two times a day (BID) | ORAL | 0 refills | Status: DC

## 2024-05-10 NOTE — Telephone Encounter (Signed)
 Requested Prescriptions   Pending Prescriptions Disp Refills   amphetamine -dextroamphetamine  (ADDERALL) 20 MG tablet 60 tablet 0    Sig: Take 1 tablet (20 mg total) by mouth 2 (two) times daily.   Last seen 01/05/24 Next appt 08/10/24  Dispenses   Dispensed Days Supply Quantity Provider Pharmacy  DEXTROAMP-AMPHETAMIN 20 MG TAB 04/10/2024 30 60 each Sater, Charlie LABOR, MD [facility] [phone] - C...  DEXTROAMP-AMPHETAMIN 20 MG TAB 03/10/2024 30 60 each Sater, Charlie LABOR, MD [facility] [phone] - C...  DEXTROAMP-AMPHETAMIN 20 MG TAB 02/09/2024 30 60 each Athar, Saima, MD [facility] [phone] - C...  DEXTROAMP-AMPHETAMIN 20 MG TAB 01/11/2024 30 60 each Sater, Charlie LABOR, MD [facility] [phone] - C...  DEXTROAMP-AMPHETAMIN 20 MG TAB 12/13/2023 30 60 each Sater, Charlie LABOR, MD [facility] [phone] - C...  DEXTROAMP-AMPHETAMIN 20 MG TAB 11/10/2023 30 60 each Sater, Charlie LABOR, MD [facility] [phone] - C...  DEXTROAMP-AMPHETAMIN 20 MG TAB 10/14/2023 30 60 each Sater, Charlie LABOR, MD [facility] [phone] - C...  DEXTROAMP-AMPHETAMIN 20 MG TAB 09/15/2023 30 60 each Sater, Charlie LABOR, MD [facility] [phone] - C...  DEXTROAMP-AMPHETAMIN 20 MG TAB 08/18/2023 30 60 each Sater, Charlie LABOR, MD [facility] [phone] - C...  DEXTROAMP-AMPHETAMIN 20 MG TAB 07/22/2023 30 60 each Sater, Charlie LABOR, MD [facility] [phone] - C...  DEXTROAMP-AMPHETAMIN 20 MG TAB 06/17/2023 30 60 each Sater, Charlie LABOR, MD [facility] [phone] - C.SABRASABRA

## 2024-05-18 NOTE — Telephone Encounter (Signed)
 Error

## 2024-05-25 ENCOUNTER — Encounter: Payer: Self-pay | Admitting: Neurology

## 2024-06-06 ENCOUNTER — Other Ambulatory Visit: Payer: Self-pay | Admitting: Neurology

## 2024-06-06 NOTE — Telephone Encounter (Signed)
 Last seen on 01/05/24 Follow up scheduled on 08/10/24

## 2024-06-08 ENCOUNTER — Encounter: Payer: Self-pay | Admitting: Neurology

## 2024-06-08 MED ORDER — AMPHETAMINE-DEXTROAMPHETAMINE 20 MG PO TABS: 20.0000 mg | ORAL_TABLET | Freq: Two times a day (BID) | ORAL | 0 refills | Status: AC

## 2024-06-08 NOTE — Telephone Encounter (Signed)
 Last seen on 01/05/24 Follow up scheduled on 09/03/24   Dispensed Days Supply Quantity Provider Pharmacy  DEXTROAMP-AMPHETAMIN 20 MG TAB 05/10/2024 30 60 each Sater, Charlie LABOR, MD [facility] [phone] - C...      Rx pending to be signed

## 2024-08-10 ENCOUNTER — Ambulatory Visit: Admitting: Neurology
